# Patient Record
Sex: Female | Born: 1950 | Race: White | Hispanic: No | Smoking: Never smoker
Health system: Southern US, Community
[De-identification: ages and names within clinical notes are randomized; demographics above are authoritative.]

## PROBLEM LIST (undated history)

## (undated) DIAGNOSIS — E876 Hypokalemia: Secondary | ICD-10-CM

## (undated) DIAGNOSIS — E785 Hyperlipidemia, unspecified: Secondary | ICD-10-CM

## (undated) DIAGNOSIS — F329 Major depressive disorder, single episode, unspecified: Secondary | ICD-10-CM

## (undated) DIAGNOSIS — R7303 Prediabetes: Secondary | ICD-10-CM

## (undated) DIAGNOSIS — Z9884 Bariatric surgery status: Secondary | ICD-10-CM

## (undated) DIAGNOSIS — F419 Anxiety disorder, unspecified: Secondary | ICD-10-CM

## (undated) DIAGNOSIS — F32A Depression, unspecified: Secondary | ICD-10-CM

## (undated) DIAGNOSIS — I10 Essential (primary) hypertension: Secondary | ICD-10-CM

## (undated) DIAGNOSIS — E039 Hypothyroidism, unspecified: Secondary | ICD-10-CM

## (undated) DIAGNOSIS — D649 Anemia, unspecified: Secondary | ICD-10-CM

## (undated) HISTORY — PX: CHOLECYSTECTOMY: SHX55

## (undated) HISTORY — DX: Depression, unspecified: F32.A

## (undated) HISTORY — PX: GASTRIC BYPASS: SHX52

## (undated) HISTORY — DX: Anxiety disorder, unspecified: F41.9

## (undated) HISTORY — PX: CERVICAL FUSION: SHX112

## (undated) HISTORY — DX: Prediabetes: R73.03

## (undated) HISTORY — DX: Major depressive disorder, single episode, unspecified: F32.9

## (undated) HISTORY — DX: Anemia, unspecified: D64.9

## (undated) HISTORY — DX: Hypokalemia: E87.6

## (undated) HISTORY — DX: Essential (primary) hypertension: I10

## (undated) HISTORY — PX: TUBAL LIGATION: SHX77

## (undated) HISTORY — DX: Bariatric surgery status: Z98.84

## (undated) HISTORY — DX: Hyperlipidemia, unspecified: E78.5

## (undated) HISTORY — DX: Hypothyroidism, unspecified: E03.9

---

## 1997-09-28 ENCOUNTER — Other Ambulatory Visit: Admission: RE | Admit: 1997-09-28 | Discharge: 1997-09-28 | Payer: Self-pay | Admitting: *Deleted

## 1998-01-22 ENCOUNTER — Encounter: Payer: Self-pay | Admitting: *Deleted

## 1998-01-22 ENCOUNTER — Ambulatory Visit (HOSPITAL_COMMUNITY): Admission: RE | Admit: 1998-01-22 | Discharge: 1998-01-22 | Payer: Self-pay | Admitting: *Deleted

## 1998-10-10 ENCOUNTER — Ambulatory Visit (HOSPITAL_COMMUNITY): Admission: RE | Admit: 1998-10-10 | Discharge: 1998-10-10 | Payer: Self-pay | Admitting: *Deleted

## 1998-11-11 ENCOUNTER — Ambulatory Visit (HOSPITAL_COMMUNITY): Admission: RE | Admit: 1998-11-11 | Discharge: 1998-11-12 | Payer: Self-pay

## 1998-12-17 ENCOUNTER — Inpatient Hospital Stay (HOSPITAL_COMMUNITY): Admission: EM | Admit: 1998-12-17 | Discharge: 1998-12-19 | Payer: Self-pay | Admitting: Emergency Medicine

## 1999-01-31 ENCOUNTER — Ambulatory Visit (HOSPITAL_COMMUNITY): Admission: RE | Admit: 1999-01-31 | Discharge: 1999-01-31 | Payer: Self-pay | Admitting: *Deleted

## 1999-01-31 ENCOUNTER — Encounter: Payer: Self-pay | Admitting: *Deleted

## 1999-12-10 ENCOUNTER — Emergency Department (HOSPITAL_COMMUNITY): Admission: EM | Admit: 1999-12-10 | Discharge: 1999-12-10 | Payer: Self-pay | Admitting: Emergency Medicine

## 2000-07-13 ENCOUNTER — Encounter: Payer: Self-pay | Admitting: Emergency Medicine

## 2000-07-13 ENCOUNTER — Emergency Department (HOSPITAL_COMMUNITY): Admission: EM | Admit: 2000-07-13 | Discharge: 2000-07-13 | Payer: Self-pay | Admitting: Emergency Medicine

## 2001-01-28 ENCOUNTER — Encounter: Payer: Self-pay | Admitting: *Deleted

## 2001-01-28 ENCOUNTER — Ambulatory Visit (HOSPITAL_COMMUNITY): Admission: RE | Admit: 2001-01-28 | Discharge: 2001-01-28 | Payer: Self-pay | Admitting: *Deleted

## 2002-04-28 ENCOUNTER — Encounter: Payer: Self-pay | Admitting: Emergency Medicine

## 2002-04-28 ENCOUNTER — Emergency Department (HOSPITAL_COMMUNITY): Admission: EM | Admit: 2002-04-28 | Discharge: 2002-04-28 | Payer: Self-pay | Admitting: Emergency Medicine

## 2002-07-27 ENCOUNTER — Encounter: Payer: Self-pay | Admitting: Internal Medicine

## 2002-07-27 ENCOUNTER — Inpatient Hospital Stay (HOSPITAL_COMMUNITY): Admission: EM | Admit: 2002-07-27 | Discharge: 2002-07-29 | Payer: Self-pay | Admitting: Internal Medicine

## 2002-12-01 ENCOUNTER — Encounter: Payer: Self-pay | Admitting: Internal Medicine

## 2002-12-01 ENCOUNTER — Ambulatory Visit (HOSPITAL_COMMUNITY): Admission: RE | Admit: 2002-12-01 | Discharge: 2002-12-01 | Payer: Self-pay | Admitting: Internal Medicine

## 2003-08-01 ENCOUNTER — Other Ambulatory Visit: Admission: RE | Admit: 2003-08-01 | Discharge: 2003-08-01 | Payer: Self-pay | Admitting: Internal Medicine

## 2005-03-12 ENCOUNTER — Ambulatory Visit (HOSPITAL_COMMUNITY): Admission: RE | Admit: 2005-03-12 | Discharge: 2005-03-12 | Payer: Self-pay | Admitting: Internal Medicine

## 2005-06-09 ENCOUNTER — Ambulatory Visit: Payer: Self-pay | Admitting: Internal Medicine

## 2005-07-17 ENCOUNTER — Encounter (HOSPITAL_COMMUNITY): Admission: RE | Admit: 2005-07-17 | Discharge: 2005-10-15 | Payer: Self-pay | Admitting: Gastroenterology

## 2006-03-15 ENCOUNTER — Ambulatory Visit (HOSPITAL_COMMUNITY): Admission: RE | Admit: 2006-03-15 | Discharge: 2006-03-15 | Payer: Self-pay | Admitting: Internal Medicine

## 2007-01-26 ENCOUNTER — Other Ambulatory Visit: Admission: RE | Admit: 2007-01-26 | Discharge: 2007-01-26 | Payer: Self-pay | Admitting: Interventional Radiology

## 2007-01-26 ENCOUNTER — Encounter: Admission: RE | Admit: 2007-01-26 | Discharge: 2007-01-26 | Payer: Self-pay | Admitting: Otolaryngology

## 2007-01-26 ENCOUNTER — Encounter (INDEPENDENT_AMBULATORY_CARE_PROVIDER_SITE_OTHER): Payer: Self-pay | Admitting: Interventional Radiology

## 2007-03-17 ENCOUNTER — Ambulatory Visit: Payer: Self-pay | Admitting: Internal Medicine

## 2007-03-17 DIAGNOSIS — J45909 Unspecified asthma, uncomplicated: Secondary | ICD-10-CM | POA: Insufficient documentation

## 2007-03-29 ENCOUNTER — Encounter: Payer: Self-pay | Admitting: Internal Medicine

## 2007-05-23 ENCOUNTER — Telehealth (INDEPENDENT_AMBULATORY_CARE_PROVIDER_SITE_OTHER): Payer: Self-pay | Admitting: *Deleted

## 2007-08-30 ENCOUNTER — Ambulatory Visit (HOSPITAL_COMMUNITY): Admission: RE | Admit: 2007-08-30 | Discharge: 2007-08-30 | Payer: Self-pay | Admitting: Internal Medicine

## 2008-09-17 ENCOUNTER — Ambulatory Visit (HOSPITAL_COMMUNITY): Admission: RE | Admit: 2008-09-17 | Discharge: 2008-09-17 | Payer: Self-pay | Admitting: Internal Medicine

## 2009-06-13 ENCOUNTER — Encounter: Admission: RE | Admit: 2009-06-13 | Discharge: 2009-06-13 | Payer: Self-pay | Admitting: Gastroenterology

## 2009-09-23 ENCOUNTER — Ambulatory Visit (HOSPITAL_COMMUNITY): Admission: RE | Admit: 2009-09-23 | Discharge: 2009-09-23 | Payer: Self-pay | Admitting: Internal Medicine

## 2010-06-05 ENCOUNTER — Other Ambulatory Visit (HOSPITAL_COMMUNITY): Payer: Self-pay | Admitting: Internal Medicine

## 2010-06-05 ENCOUNTER — Ambulatory Visit (HOSPITAL_COMMUNITY)
Admission: RE | Admit: 2010-06-05 | Discharge: 2010-06-05 | Disposition: A | Discharge status: Home / Self Care | Payer: Medicare Other | Source: Ambulatory Visit | Attending: Internal Medicine | Admitting: Internal Medicine

## 2010-06-05 DIAGNOSIS — R059 Cough, unspecified: Secondary | ICD-10-CM | POA: Insufficient documentation

## 2010-06-05 DIAGNOSIS — I10 Essential (primary) hypertension: Secondary | ICD-10-CM | POA: Insufficient documentation

## 2010-06-05 DIAGNOSIS — R05 Cough: Secondary | ICD-10-CM | POA: Insufficient documentation

## 2010-06-05 DIAGNOSIS — J189 Pneumonia, unspecified organism: Secondary | ICD-10-CM

## 2010-06-05 DIAGNOSIS — R0602 Shortness of breath: Secondary | ICD-10-CM | POA: Insufficient documentation

## 2010-06-05 DIAGNOSIS — J45909 Unspecified asthma, uncomplicated: Secondary | ICD-10-CM | POA: Insufficient documentation

## 2010-06-24 ENCOUNTER — Encounter: Payer: Self-pay | Admitting: Cardiology

## 2010-07-07 ENCOUNTER — Telehealth: Payer: Self-pay | Admitting: Internal Medicine

## 2010-07-07 ENCOUNTER — Ambulatory Visit (INDEPENDENT_AMBULATORY_CARE_PROVIDER_SITE_OTHER): Payer: Medicare Other | Admitting: Cardiology

## 2010-07-07 ENCOUNTER — Encounter: Payer: Self-pay | Admitting: Cardiology

## 2010-07-07 DIAGNOSIS — R0609 Other forms of dyspnea: Secondary | ICD-10-CM

## 2010-07-07 DIAGNOSIS — R0989 Other specified symptoms and signs involving the circulatory and respiratory systems: Secondary | ICD-10-CM

## 2010-07-07 DIAGNOSIS — R42 Dizziness and giddiness: Secondary | ICD-10-CM

## 2010-07-07 DIAGNOSIS — E785 Hyperlipidemia, unspecified: Secondary | ICD-10-CM

## 2010-07-07 DIAGNOSIS — J45909 Unspecified asthma, uncomplicated: Secondary | ICD-10-CM

## 2010-07-07 LAB — BRAIN NATRIURETIC PEPTIDE: Pro B Natriuretic peptide (BNP): 18 pg/mL (ref 0.0–100.0)

## 2010-07-07 NOTE — Telephone Encounter (Signed)
Spoke w/ Jasmine December w/ Dr. Shirlee Latch office and she states pt has stated she has been in the bed all week due to her asthma and would like to be seen sooner than 08/19/10 w/ Dr. Maple Hudson. Looks like last time pt was last seen in 2008. Please advise if we can work pt to be seen sooner. Thanks  Carver Fila, CMA

## 2010-07-07 NOTE — Assessment & Plan Note (Signed)
Patient has had significant exertional dyspnea (NYHA class III symptoms) for the last 6 months.  For the last week, she has had coughing and wheezing reminiscent of an asthma exacerbation.  She is not wheezing on exam.  It is possible that her symptoms are all due to obesity/deconditioning and asthma.  However, she has a number of risk factors for coronary disease: HTN, hyperlipidemia, family history (mother).   Lauren Mckenzie to assess for ischemia - Echo to assess LV/RV function and to look for evidence of pulmonary hypertension. - Check BNP today.  - Start ASA 81 mg daily - Followup with Dr. Maple Hudson for asthma evaluation.

## 2010-07-07 NOTE — Assessment & Plan Note (Signed)
Excellent lipids at last check.  Continue pravastatin.

## 2010-07-07 NOTE — Progress Notes (Signed)
PCP: Dr. Elisabeth Most  60 yo with history of HTN, hyperlipidemia, and asthma presents for cardiology evaluation.  Patient has had periodic asthma flares with wheezing since childhood.  She has been evaluated by pulmonology, seeing Dr. Maple Hudson back in 2008.  For the last 6 months, she has noted significant exertional dyspnea.  She is short of breath after walking < 50 feet.  She is very short of breath walking up a flight of steps.  No chest pain or tightness.  Over the last week, she has been coughing and wheezing and thinks that she has been having an asthma flare.  She is a nonsmoker.  Additionally, she has episodes of dizziness periodically.  These are a spinning sensation that occurs with positional changes (lying down or sitting up).  This does not happen particularly often but has been worse x 6 months also.   ECG: NSR, normal  Labs (2/12): HCT 34.1, K 4.4, creatinine 1.33, HDL 46, LDL 57, TSH elevated (Levoxyl started)  PMH: 1. HTN 2. Hyperlipidemia 3. Depression 4. Hypothyroidism 5. Asthma since childhood.  Has seen Dr. Maple Hudson in the past for this. 6. Obesity: s/p gastric bypass 7. C-spine fusion 8. H/o cholecystectomy  SH: Patient never smoked.  She is on disability.  She lives with a friend.  No children.  No ETOH.   FH: Mother with MI at 36.  No siblings.  ROS: All systems reviewed and negative except as per HPI.   Current Outpatient Prescriptions  Medication Sig Dispense Refill  . albuterol (PROVENTIL,VENTOLIN) 90 MCG/ACT inhaler Inhale 2 puffs into the lungs every 6 (six) hours as needed.        . Albuterol Sulfate (PROAIR HFA IN) Prn       . ALPRAZolam (XANAX) 1 MG tablet Take 1 mg by mouth at bedtime as needed.        Marland Kitchen aspirin 81 MG EC tablet Take 81 mg by mouth daily.        . Cholecalciferol (VITAMIN D) 2000 UNITS CAPS 2 po daily       . diazepam (VALIUM) 10 MG tablet Take 10 mg by mouth every 6 (six) hours as needed.        . enalapril (VASOTEC) 20 MG tablet       .  Fluticasone-Salmeterol (ADVAIR DISKUS) 100-50 MCG/DOSE AEPB Inhale 1 puff into the lungs every 12 (twelve) hours.        Marland Kitchen levothyroxine (SYNTHROID, LEVOTHROID) 25 MCG tablet       . mirtazapine (REMERON) 15 MG tablet       . pravastatin (PRAVACHOL) 40 MG tablet       . QUEtiapine (SEROQUEL) 100 MG tablet Take 100 mg by mouth at bedtime.        Marland Kitchen SINGULAIR 10 MG tablet       . venlafaxine (EFFEXOR) 75 MG tablet Take 75 mg by mouth at bedtime.        . Venlafaxine HCl 225 MG TB24 Take 1 tablet by mouth daily.        Marland Kitchen EPINEPHrine (EPIPEN JR) 0.15 MG/0.3ML injection Inject 0.15 mg into the muscle as needed.        Marland Kitchen DISCONTD: buPROPion (WELLBUTRIN XL) 300 MG 24 hr tablet Take 300 mg by mouth 3 (three) times daily.        Marland Kitchen DISCONTD: buPROPion (WELLBUTRIN) 100 MG tablet Take 100 mg by mouth 2 (two) times daily.        Marland Kitchen DISCONTD: DULoxetine (CYMBALTA) 60 MG  capsule Take 60 mg by mouth daily.        Marland Kitchen DISCONTD: Ferrous Sulfate (IRON CR PO) 1 po daily       . DISCONTD: Levalbuterol HCl (XOPENEX CONCENTRATE IN) Inhale into the lungs. As directed       . DISCONTD: lidocaine (XYLOCAINE) 5 % ointment       . DISCONTD: LORazepam (ATIVAN) 1 MG tablet Take 1 mg by mouth every 8 (eight) hours.        Marland Kitchen DISCONTD: metroNIDAZOLE (FLAGYL) 500 MG tablet Take 500 mg by mouth 3 (three) times daily.        Marland Kitchen DISCONTD: modafinil (PROVIGIL) 100 MG tablet Take 100 mg by mouth as needed.        Marland Kitchen DISCONTD: promethazine (PHENERGAN) 25 MG tablet Take 25 mg by mouth every 6 (six) hours as needed.        Marland Kitchen DISCONTD: SUMAtriptan (IMITREX) 100 MG tablet Take 100 mg by mouth every 2 (two) hours as needed.        Marland Kitchen DISCONTD: temazepam (RESTORIL) 15 MG capsule Take 15 mg by mouth at bedtime as needed.        Marland Kitchen DISCONTD: thioridazine (MELLARIL) 50 MG tablet Take 50 mg by mouth at bedtime.        Marland Kitchen DISCONTD: triamterene-hydrochlorothiazide (MAXZIDE) 75-50 MG per tablet Take 1 tablet by mouth as needed.        Marland Kitchen DISCONTD:  venlafaxine (EFFEXOR) 100 MG tablet 100 mg. 3 po bid         BP 142/78  Pulse 85  Ht 5\' 5"  (1.651 m)  Wt 249 lb (112.946 kg)  BMI 41.44 kg/m2 General: NAD, obese.  Neck: No JVD, no thyromegaly or thyroid nodule.  Lungs: Clear to auscultation bilaterally with normal respiratory effort. CV: Nondisplaced PMI.  Heart regular S1/S2, no S3/S4, no murmur.  No peripheral edema.  No carotid bruit.  Normal pedal pulses.  Abdomen: Soft, nontender, no hepatosplenomegaly, no distention.  Skin: Intact without lesions or rashes.  Neurologic: Alert and oriented x 3.  Psych: Normal affect. Extremities: No clubbing or cyanosis.  HEENT: Normal.

## 2010-07-07 NOTE — Patient Instructions (Signed)
Take Aspirin 81mg  daily. This should be enteric coated.  Lab today--BNP 786.09  Schedule an appointment for a lexiscan myoview. See instruction sheet.  Schedule an appointment for an echocardiogram.  Dr Shirlee Latch has referred you to Dr Maple Hudson in pulmonary for management and treatment of asthma.  Schedule an appointment to see Dr Shirlee Latch in 3 weeks.

## 2010-07-07 NOTE — Assessment & Plan Note (Signed)
Symptoms sound like benign paroxysmal positional vertigo.  Would have her followup with Dr. Elisabeth Most, could consider ENT or neurology referral if this becomes significantly bothersome.

## 2010-07-08 ENCOUNTER — Telehealth: Payer: Self-pay | Admitting: Cardiology

## 2010-07-08 NOTE — Telephone Encounter (Signed)
Katie- please see what is available

## 2010-07-08 NOTE — Telephone Encounter (Signed)
I have scheduled pt to see CDY on 07-10-10 at 2pm; attempted to call Dr. Alford Highland office-closed; will call back in the morning. Lauren Mckenzie

## 2010-07-09 ENCOUNTER — Encounter: Payer: Self-pay | Admitting: Internal Medicine

## 2010-07-09 ENCOUNTER — Ambulatory Visit (HOSPITAL_COMMUNITY): Payer: Medicare Other | Attending: Cardiology | Admitting: Radiology

## 2010-07-09 DIAGNOSIS — R0609 Other forms of dyspnea: Secondary | ICD-10-CM

## 2010-07-09 DIAGNOSIS — R0600 Dyspnea, unspecified: Secondary | ICD-10-CM

## 2010-07-09 DIAGNOSIS — R0602 Shortness of breath: Secondary | ICD-10-CM

## 2010-07-09 MED ORDER — TECHNETIUM TC 99M TETROFOSMIN IV KIT
33.0000 | PACK | Freq: Once | INTRAVENOUS | Status: AC | PRN
  Administered 2010-07-09: 33 via INTRAVENOUS

## 2010-07-09 MED ORDER — REGADENOSON 0.4 MG/5ML IV SOLN
0.4000 mg | Freq: Once | INTRAVENOUS | Status: AC
  Administered 2010-07-09: 0.4 mg via INTRAVENOUS

## 2010-07-09 NOTE — Telephone Encounter (Signed)
I reviewed with Dr Shirlee Latch. OK with Dr Shirlee Latch for Dr Eden Emms in follow-up.

## 2010-07-09 NOTE — Telephone Encounter (Signed)
Spoke with Jasmine December at Dr. Doy Mince will make patient aware of appt set up with CDY for 07-10-10-if this appt will not work then she or the patient will call me here to [facility].Vivianne Spence

## 2010-07-09 NOTE — Progress Notes (Signed)
[facility] SITE 3 NUCLEAR MED [address] [place] [address] [phone]  Cardiology Nuclear Med Study  Lauren Mckenzie is a 60 y.o. female [id number] [birth year]   Nuclear Med Background Indication for Stress Test:  Evaluation for Ischemia History:  No previous documented CAD Cardiac Risk Factors: Family History - CAD, Hypertension, Lipids and Obesity  Symptoms:  DOE, Fatigue and Palpitations   Nuclear Pre-Procedure Caffeine/Decaff Intake:  None NPO After: 7:00pm   Lungs:  Clear.  O2 sat 96% on RA. IV 0.9% NS with Angio Cath:  20g  IV Site: R Forearm  IV Started by:  Stanton Kidney, EMT-P  Chest Size (in):  46 Cup Size: C  Height: 5' 5.5" (1.664 m)  Weight:  250 lb (113.399 kg)  BMI:  Body mass index is 40.97 kg/(m^2). Tech Comments:  NA    Nuclear Med Study 1 or 2 day study: 2 day  Stress Test Type:  Lexiscan  Reading MD: Willa Rough, MD  Order Authorizing Provider:  Marca Ancona, MD  Resting Radionuclide: Technetium 33m Tetrofosmin  Resting Radionuclide Dose: 33 mCi   Stress Radionuclide:  Technetium 60m Tetrofosmin  Stress Radionuclide Dose: 33 mCi           Stress Protocol Rest HR: 85 Stress HR: 94  Rest BP: 126/76 Stress BP: 131/72  Exercise Time (min): n/a METS: n/a   Predicted Max HR: 160 bpm % Max HR: 58.75 bpm Rate Pressure Product: 21308   Dose of Adenosine (mg):  n/a Dose of Lexiscan: 0.4 mg  Dose of Atropine (mg): n/a Dose of Dobutamine: n/a mcg/kg/min (at max HR)  Stress Test Technologist: [employer], CMA-N  Nuclear Technologist:  Domenic Polite, CNMT     Rest Procedure:  Myocardial perfusion imaging was performed at rest 45 minutes following the intravenous administration of Technetium 53m Tetrofosmin. Rest ECG: No acute changes.  Stress Procedure:  The patient received IV Lexiscan 0.4 mg over 15-seconds.  Technetium 7m Tetrofosmin injected at 30-seconds.  There were no significant changes with Lexiscan, other than  occasional PVC's.  Quantitative spect images were obtained after a 45 minute delay.  Stress ECG: No significant change from baseline ECG  QPS Raw Data Images:  Patient motion noted; appropriate software correction applied. Stress Images:  Mild anterior breast attebuation. Rest Images:  Same as stress Subtraction (SDS):  No evidence of ischemia. Transient Ischemic Dilatation (Normal <1.22):  0.85 Lung/Heart Ratio (Normal <0.45):  0.35  Quantitative Gated Spect Images QGS EDV:  46 ml QGS ESV:  11 ml QGS cine images:  Normal Wall Motion QGS EF: 75%  Impression Exercise Capacity:  Lexiscan with no exercise. BP Response:  Normal blood pressure response. Clinical Symptoms:  Warm ECG Impression:  No significant ST segment change suggestive of ischemia. Comparison with Prior Nuclear Study: No previous nuclear study performed  Overall Impression:  Normal stress nuclear study.  No scar or ischemia.    Willa Rough

## 2010-07-10 ENCOUNTER — Institutional Professional Consult (permissible substitution): Payer: Medicare Other | Admitting: Internal Medicine

## 2010-07-14 ENCOUNTER — Ambulatory Visit (HOSPITAL_COMMUNITY): Payer: Medicare Other | Attending: Cardiology | Admitting: Radiology

## 2010-07-14 ENCOUNTER — Ambulatory Visit (HOSPITAL_COMMUNITY): Payer: Medicare Other | Attending: Cardiology

## 2010-07-14 DIAGNOSIS — R0602 Shortness of breath: Secondary | ICD-10-CM

## 2010-07-14 DIAGNOSIS — R0609 Other forms of dyspnea: Secondary | ICD-10-CM | POA: Insufficient documentation

## 2010-07-14 DIAGNOSIS — R0989 Other specified symptoms and signs involving the circulatory and respiratory systems: Secondary | ICD-10-CM

## 2010-07-14 DIAGNOSIS — R079 Chest pain, unspecified: Secondary | ICD-10-CM

## 2010-07-14 MED ORDER — TECHNETIUM TC 99M TETROFOSMIN IV KIT
33.0000 | PACK | Freq: Once | INTRAVENOUS | Status: AC | PRN
  Administered 2010-07-14: 33 via INTRAVENOUS

## 2010-07-22 NOTE — Progress Notes (Signed)
Pt notified of results by telephone. 

## 2010-08-01 ENCOUNTER — Ambulatory Visit: Payer: Medicare Other | Admitting: Cardiology

## 2010-08-04 ENCOUNTER — Encounter: Payer: Self-pay | Admitting: Cardiovascular Disease

## 2010-08-04 ENCOUNTER — Encounter: Payer: Self-pay | Admitting: *Deleted

## 2010-08-05 ENCOUNTER — Encounter: Payer: Self-pay | Admitting: Cardiovascular Disease

## 2010-08-05 ENCOUNTER — Ambulatory Visit: Payer: Medicare Other | Admitting: Cardiovascular Disease

## 2010-08-05 ENCOUNTER — Ambulatory Visit (INDEPENDENT_AMBULATORY_CARE_PROVIDER_SITE_OTHER): Payer: Medicare Other | Admitting: Cardiovascular Disease

## 2010-08-05 VITALS — BP 102/88 | HR 84 | Resp 16 | Ht 65.0 in | Wt 248.0 lb

## 2010-08-05 DIAGNOSIS — R5383 Other fatigue: Secondary | ICD-10-CM

## 2010-08-05 DIAGNOSIS — R531 Weakness: Secondary | ICD-10-CM

## 2010-08-05 NOTE — Progress Notes (Signed)
HPI:  This is a 60 year old woman presenting for followup evaluation. She was recently seen by Dr. Jearld Pies. She presented for evaluation of shortness of breath and fatigue. She underwent evaluation with a nuclear stress study that showed no significant ischemia. Her gated left ventricular ejection fraction was 75%. She also had an echocardiogram which demonstrated normal left ventricular systolic function and no significant abnormalities.  The patient continues to complain of marked weakness and fatigue. She has chronic shortness of breath but this has not really changed. She underwent remote gastric bypass surgery in 2004 and has gained back much of her weight. She denies chest pain or pressure. She denies leg edema, orthopnea, or PND.  She notes that she has been recently diagnosed with hypothyroidism. She has been started on Synthroid and is seeing her primary care physician back for further evaluation today  Outpatient Encounter Prescriptions as of 08/05/2010  Medication Sig Dispense Refill  . albuterol (PROVENTIL,VENTOLIN) 90 MCG/ACT inhaler Inhale 2 puffs into the lungs every 6 (six) hours as needed.        . Albuterol Sulfate (PROAIR HFA IN) Prn       . ALPRAZolam (XANAX) 1 MG tablet Take 1 mg by mouth at bedtime as needed.        Marland Kitchen aspirin 81 MG EC tablet Take 81 mg by mouth daily.        . Cholecalciferol (VITAMIN D) 2000 UNITS CAPS 2 po daily       . diazepam (VALIUM) 10 MG tablet Take 10 mg by mouth every 6 (six) hours as needed.        . enalapril (VASOTEC) 20 MG tablet 1 tab po qd      . EPINEPHrine (EPIPEN JR) 0.15 MG/0.3ML injection Inject 0.15 mg into the muscle as needed.        . Fluticasone-Salmeterol (ADVAIR DISKUS) 100-50 MCG/DOSE AEPB Inhale 1 puff into the lungs every 12 (twelve) hours.        Marland Kitchen levothyroxine (SYNTHROID, LEVOTHROID) 25 MCG tablet 1 tab po qd      . mirtazapine (REMERON) 15 MG tablet 1 tab po qd      . pravastatin (PRAVACHOL) 40 MG tablet 1 tab po qd      .  QUEtiapine (SEROQUEL) 100 MG tablet Take 100 mg by mouth at bedtime.        Marland Kitchen SINGULAIR 10 MG tablet 1 tab po qd      . Venlafaxine HCl 225 MG TB24 Take 1 tablet by mouth daily.        Marland Kitchen DISCONTD: venlafaxine (EFFEXOR) 75 MG tablet Take 75 mg by mouth at bedtime.         Theophylline  Past Medical History  Diagnosis Date  . S/P gastric bypass   . Hypokalemia   . Anxiety and depression   . Abdominal pain      status post gastric bypass for bariatric surgical   management.    Past Surgical History  Procedure Date  . Gastric bypass   . Cholecystectomy   . Tubal ligation   . Cervical fusion     C6-7    History   Social History  . Marital Status: Divorced    Spouse Name: N/A    Number of Children: N/A  . Years of Education: N/A   Occupational History  . unemployed    Social History Main Topics  . Smoking status: Never Smoker   . Smokeless tobacco: Never Used  . Alcohol Use: No  .  Drug Use: No  . Sexually Active: Not on file   Other Topics Concern  . Not on file   Social History Narrative   Has chihuahua   Never smoked, no alcohol    Family History  Problem Relation Age of Onset  . Heart attack Mother   . Heart attack Father     ROS: General: positive for sweats Eyes: no blurry vision, diplopia, or amaurosis ENT: no sore throat or hearing loss Resp: no cough, wheezing, or hemoptysis CV: no edema or palpitations GI: no abdominal pain, nausea, vomiting, diarrhea, or constipation GU: no dysuria, frequency, or hematuria Skin: no rash Neuro: no headache, numbness, tingling, or weakness of extremities Musculoskeletal: no joint pain or swelling Heme: no bleeding, DVT, or easy bruising Endo: no polydipsia or polyuria  BP 102/88  Pulse 84  Resp 16  Ht 5\' 5"  (1.651 m)  Wt 248 lb (112.492 kg)  BMI 41.27 kg/m2  PHYSICAL EXAM: Pt is alert and oriented, obese woman in no distress. HEENT: normal Neck: JVP normal. Carotid upstrokes normal without bruits. No  thyromegaly. Lungs: equal expansion, clear bilaterally CV: Apex is discrete and nondisplaced, RRR without murmur or gallop Abd: soft, NT, +BS, obese Back: no CVA tenderness Ext: no C/C/E        Femoral pulses 2+= without bruits        DP/PT pulses intact and = Skin: warm and dry without rash Neuro: CNII-XII intact             Strength intact = bilaterally  ASSESSMENT AND PLAN:

## 2010-08-05 NOTE — Patient Instructions (Signed)
Hold Pravastatin for 4 weeks.   Your physician recommends that you schedule a follow-up appointment as needed with Dr. Excell Seltzer.

## 2010-08-05 NOTE — Assessment & Plan Note (Signed)
I reviewed all the patient's cardiac studies, including her nuclear stress test and 2-D echocardiogram. I do not think there is a cardiac etiology to her generalized weakness. It's possible that hypothyroidism as contributing, but I don't know the details or severity of this. I suspect this was fairly mild and she is only taking 25 mcg of Synthroid daily.  I think it's possible that her statin drug as contributing as the timeframe of her progressive weakness correlates with taking pravastatin. She was advised to hold this medication for a period of one month and see if her symptoms improve. She will followup with Dr. Elisabeth Most to make a decision regarding whether to resume her statin after a period of one month to assess her clinical response to holding this medication. I advised the patient that I would be happy to see her back in followup at any time as needed. At this time I don't see a need for further cardiac testing.

## 2010-08-06 ENCOUNTER — Telehealth: Payer: Self-pay | Admitting: Cardiovascular Disease

## 2010-08-06 NOTE — Telephone Encounter (Signed)
Faxed Echo. & Stress to Trip @ [facility] ([phone]).

## 2010-08-15 NOTE — Discharge Summary (Signed)
NAME:  Lauren Mckenzie, Lauren Mckenzie                         ACCOUNT NO.:  [id number]   MEDICAL RECORD NO.:  [id number]                   PATIENT TYPE:  INP   LOCATION:  5023                                 FACILITY:  [facility]   PHYSICIAN:  Clinton D. Maple Hudson, M.D.              DATE OF BIRTH:  05-26-1950   DATE OF ADMISSION:  07/27/2002  DATE OF DISCHARGE:  07/29/2002                                 DISCHARGE SUMMARY   DISCHARGE DIAGNOSES:  1. Status asthmaticus.  2. Hypokalemia.  3. Anxiety/depression.  4. Abdominal pain, status post gastric bypass for bariatric surgical     management.   BRIEF HISTORY:  This is a 60 year old white female nonsmoker known to me  with a very long history of asthma complicated by anxiety and depression.  She presented initially to [facility] with an  exacerbation of wheezing and dyspnea beginning the day before and triggered  by trying to clean a bathroom with Clorox.  She had used her home nebulizer  aggressively and then was treated with an adequate response at [facility]  as I was contacted for admission.  Peak flow at [facility] was reportedly  about 100 and she had been treated with intravenous Solu-Medrol.   PAST MEDICAL HISTORY:  Recent medical history was significant for gastric  bypass surgery at [facility] in January with subsequent 55 pound  weight loss.  She had had multiple hospitalizations since January including  cholecystectomy and evaluations of diarrhea, nausea, vomiting, and shifting  abdominal pains.  Recent anxiety was triggered by loss of her job and she  has been voluntarily admitted to the psychiatry service at [facility] for  adjustment of medication.  Additional past history included hospitalization  3-4 times for asthma since onset at age 78 without intubation or ventilator  support for respiratory failure, previous history of migraine, viral  meningitis, and depression.  She is followed by Dr. Tasia Catchings  for  primary medical care and hypertension.   There has been a previous chemical stress test without evidence of cardiac  disease.  Surgeries have included tubal ligation, gastric bypass surgery in  January 2004, and cholecystectomy.  She had recently weaned off of Effexor.   PHYSICAL EXAMINATION:  VITAL SIGNS:  Pulse 110 and regular, afebrile, blood  pressure 143/76.  GENERAL:  When I first arrived, she was hyperventilating, anxious, tearful,  and shivering.  She calmed gradually but remained wheezy.  HEART:  Sounds were normal.  ABDOMEN:  Healed vertical midline surgical incision scar and her abdomen  was, otherwise, unremarkable.   RADIOLOGICAL DATA:  Chest x-ray showed some elevation of the right  hemidiaphragm without infiltrate or effusion.   ADMISSION IMPRESSION:  Status asthmaticus with agitation caused by fatigue,  heavy use of bronchodilator therapies, and recent job loss.   HOSPITAL COURSE:  She was treated with intravenous magnesium and given  nebulizer treatments with Xopenex.  She was not given DVT prophylaxis  because of her uncertain postoperative status and lack of immobility.  A D-  dimer was checked.  By the next day, she was feeling successfully better.  Medications were reduced and adjusted to home doses.  She requested Xopenex  for home use as better tolerated than her albuterol.  Oxygen saturation on  room air was 98%.  Her condition was markedly improved and she was  considered to be stable and appropriate for discharge home to office  followup wheeze-free and with normal heart sounds, eating well at discharge.   LABORATORY DATA:  Chest x-ray showed hyperinflation, normal heart, prior  cervical plating.   Initial ABGs on 7 L prongs showed pH 7.39, pCO2 33, pO2 127, bicarbonate  19.8.  Initial chemistries revealed sodium 142, potassium low at 3.0,  glucose 167, BUN 12, creatinine 1.4, albumin 3.9.  D-dimer was slightly  elevated at 0.76, considered  nonspecific.  White blood count was 9000 with  normal platelet count, hemoglobin 11,300, 88% neutrophils.  Liver enzymes  were normal.  A repeat chemistry panel showed potassium improved to 5.1.  Glucose improved to 126.   DISCHARGE PLANS:  Diet:  Unrestricted, but consistent with her surgical  instruction.  Activity: Unrestricted.   DISCHARGE MEDICATIONS:  1. Advair 250/50 one inhalation b.i.d.  2. Proventil HSA inhaler two puffs q.i.d. p.r.n.  3. Nebulizer with Xopenex 0.63 q.8 h. p.r.n.  4. Prednisone taper from 10 mg t.i.d., off in six days.  5. Mellaril 10 mg daily.  6. Klonopin 0.5 mg b.i.d.  7. EpiPen to have available for rare use if necessary.  8. Norvasc 5 mg daily.  9. Potassium 20 mEq daily.   FOLLOWUP:  She is to call the office for followup in two weeks after  hospital discharge.  She will follow up with her bariatric surgery at  [facility] as scheduled.  She indicates that she plans to follow up for her  mood disorder with Dr. Milagros Evener and she will follow up with Dr.  Sherin Quarry for internal medicine and GI management.                                               Clinton D. Maple Hudson, M.D.    CDY/MEDQ  D:  08/23/2002  T:  08/24/2002  Job:  [id number]   cc:   Tasia Catchings, M.D.  [address]  [place]  [place] [address]  Fax: [phone]   Milagros Evener, M.D.  [address] [id number]  [place], [place] [address]  Fax: [phone]
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
## 2010-08-19 ENCOUNTER — Institutional Professional Consult (permissible substitution): Payer: Medicare Other | Admitting: Internal Medicine

## 2010-09-26 ENCOUNTER — Other Ambulatory Visit: Payer: Self-pay | Admitting: Internal Medicine

## 2010-09-29 ENCOUNTER — Other Ambulatory Visit: Payer: Medicare Other

## 2010-09-30 ENCOUNTER — Ambulatory Visit
Admission: RE | Admit: 2010-09-30 | Discharge: 2010-09-30 | Disposition: A | Discharge status: Home / Self Care | Payer: Medicare Other | Source: Ambulatory Visit | Attending: Internal Medicine | Admitting: Internal Medicine

## 2010-09-30 MED ORDER — IOHEXOL 300 MG/ML  SOLN: 125.0000 mL | Freq: Once | INTRAMUSCULAR | Status: AC | PRN

## 2010-10-02 ENCOUNTER — Other Ambulatory Visit: Payer: Self-pay | Admitting: Internal Medicine

## 2010-10-02 DIAGNOSIS — R935 Abnormal findings on diagnostic imaging of other abdominal regions, including retroperitoneum: Secondary | ICD-10-CM

## 2010-10-08 ENCOUNTER — Ambulatory Visit
Admission: RE | Admit: 2010-10-08 | Discharge: 2010-10-08 | Disposition: A | Discharge status: Home / Self Care | Payer: Medicare Other | Source: Ambulatory Visit | Attending: Internal Medicine | Admitting: Internal Medicine

## 2010-10-08 DIAGNOSIS — R935 Abnormal findings on diagnostic imaging of other abdominal regions, including retroperitoneum: Secondary | ICD-10-CM

## 2010-12-15 ENCOUNTER — Telehealth: Payer: Self-pay | Admitting: Internal Medicine

## 2010-12-15 NOTE — Telephone Encounter (Signed)
Spoke with patient-aware to be here on Monday 12-22-10 at 245pm for 3pm consult with CY.

## 2010-12-15 NOTE — Telephone Encounter (Signed)
Spoke with pt. She is requesting appt with CDY for increasing DOE. She was last seen in 08 so will need consult slot and first available is in late Oct. She wants to be worked in sooner and states will only see CDY. Please advise, thanks!

## 2010-12-19 ENCOUNTER — Other Ambulatory Visit (HOSPITAL_COMMUNITY): Payer: Self-pay | Admitting: Internal Medicine

## 2010-12-19 DIAGNOSIS — Z1231 Encounter for screening mammogram for malignant neoplasm of breast: Secondary | ICD-10-CM

## 2010-12-22 ENCOUNTER — Encounter: Payer: Self-pay | Admitting: Internal Medicine

## 2010-12-22 ENCOUNTER — Ambulatory Visit (INDEPENDENT_AMBULATORY_CARE_PROVIDER_SITE_OTHER): Payer: Medicare Other | Admitting: Internal Medicine

## 2010-12-22 VITALS — BP 130/90 | HR 109 | Ht 66.0 in | Wt 244.4 lb

## 2010-12-22 DIAGNOSIS — R0989 Other specified symptoms and signs involving the circulatory and respiratory systems: Secondary | ICD-10-CM

## 2010-12-22 DIAGNOSIS — R0609 Other forms of dyspnea: Secondary | ICD-10-CM

## 2010-12-22 DIAGNOSIS — J45909 Unspecified asthma, uncomplicated: Secondary | ICD-10-CM

## 2010-12-22 DIAGNOSIS — R0600 Dyspnea, unspecified: Secondary | ICD-10-CM

## 2010-12-22 NOTE — Patient Instructions (Addendum)
Order- schedule and PFT    Please get CBG at beginning and end of the    Dx dyspnea, asthma             Schedule ONOX on room air

## 2010-12-22 NOTE — Progress Notes (Signed)
Subjective:    Patient ID: Lauren Mckenzie, female    DOB: 28-Jun-1950, 60 y.o.   MRN: [id number]  HPI 12/22/10- 49 yoF never smoker with a history of asthmatic bronchitis, anxiety/depression, allergic rhinitis. Last here in 2007. Comes now to reestablish within the chart. PCP Dr Oneta Rack. She complains that for the past 2 years she is frequently weak tired and having sweating episodes, shortness of breath modest exertion. She gives history of asthma since age 66. Has regained probably all of the weight she lost after gastric bypass surgery in 2004. Her dyspnea weakness and discomfort in her reluctant to leave the house to go to the store. Recent cardiology evaluation by Dr. Excell Seltzer told her that her heart was "okay". She expresses frustration that "they" meaning numerous physicians, having done many tests including CT scans MRI, blood work, has not been able to find "the answer". She would like a prescription for portable oxygen concentrator so that she could carry oxygen around with her, but has not documented hypoxia Her psychiatrist, Dr. Evelene Croon, has suggested she has dumping syndrome, and she says that Dr. Bernarda Caffey has done upper and lower endoscopies but has not offered that diagnosis. She is pending dental implant surgery. Lives alone, on disability. She is postmenopausal. Denies history of DVT/PE or anemia.  Review of Systems Constitutional:   No-   weight loss, night sweats, fevers, chills, fatigue, lassitude.  + Exertional sweats HEENT:   No-  headaches, difficulty swallowing,+ tooth/dental problems, sore throat,       No-  sneezing, itching, ear ache, nasal congestion, post nasal drip,  CV:  No-   chest pain, orthopnea, PND, swelling in lower extremities, anasarca, dizziness, palpitations Resp: + shortness of breath with exertion Not at rest.              No-   productive cough,  No non-productive cough,  No-  coughing up of blood.              No-   change in color of mucus.  No- wheezing.     Skin: No-   rash or lesions. GI:  No-   heartburn, indigestion, abdominal pain, nausea, vomiting, diarrhea,                 change in bowel habits, loss of appetite GU: No-   dysuria, change in color of urine, no urgency or frequency.  No- flank pain. MS:  +   joint pain or swelling.  No- decreased range of motion.  No- back pain. Neuro- grossly normal to observation, Or:  Psych:  No- change in mood or affect. + depression or anxiety.  No memory loss.      Objective:   Physical Exam General- Alert, Oriented, Affect-appropriate, Distress- none acute, calm, obese, neatly dressed Skin- rash-none, lesions- none, excoriation- none Lymphadenopathy- none Head- atraumatic            Eyes- Gross vision intact, PERRLA, conjunctivae clear secretions            Ears- Hearing, canals-normal            Nose- Clear, no-Septal dev, mucus, polyps, erosion, perforation             Throat- Mallampati II , mucosa clear , drainage- none, tonsils- atrophic Neck- flexible , trachea midline, no stridor , thyroid nl, carotid no bruit Chest - symmetrical excursion , unlabored           Heart/CV- RRR , no murmur ,  no gallop  , no rub, nl s1 s2                           - JVD- none , edema- none, stasis changes- none, varices- none           Lung- clear to P&A, wheeze- none, cough- none , dullness-none, rub- none           Chest wall-  Abd- tender-no, distended-no, bowel sounds-present, HSM- no Br/ Gen/ Rectal- Not done, not indicated Extrem- cyanosis- none, clubbing, none, atrophy- none, strength- nl Neuro- grossly intact to observation         Assessment & Plan:

## 2010-12-23 NOTE — Assessment & Plan Note (Signed)
She describes some autonomic instability and exertional dyspnea but not much wheezing. Deconditioning and obesity are probably important. Cardiac problems have been ruled out by Dr. Excell Seltzer. Oxygen saturation here on room air is quite good and she is not a candidate for portable oxygen based on that. Plan schedule PFT and 6 minute walk test. We'll elect to try to get a blood sugar at the beginning and the end of her 6 minute walk exercise.

## 2011-01-01 ENCOUNTER — Ambulatory Visit (HOSPITAL_COMMUNITY)
Admission: RE | Admit: 2011-01-01 | Discharge: 2011-01-01 | Disposition: A | Discharge status: Home / Self Care | Payer: Medicare Other | Source: Ambulatory Visit | Attending: Internal Medicine | Admitting: Internal Medicine

## 2011-01-01 ENCOUNTER — Ambulatory Visit (HOSPITAL_COMMUNITY): Payer: Medicare Other

## 2011-01-01 DIAGNOSIS — Z1231 Encounter for screening mammogram for malignant neoplasm of breast: Secondary | ICD-10-CM | POA: Insufficient documentation

## 2011-01-08 ENCOUNTER — Ambulatory Visit (INDEPENDENT_AMBULATORY_CARE_PROVIDER_SITE_OTHER): Payer: Medicare Other | Admitting: Internal Medicine

## 2011-01-08 DIAGNOSIS — R0600 Dyspnea, unspecified: Secondary | ICD-10-CM

## 2011-01-08 DIAGNOSIS — J45909 Unspecified asthma, uncomplicated: Secondary | ICD-10-CM

## 2011-01-08 DIAGNOSIS — R0989 Other specified symptoms and signs involving the circulatory and respiratory systems: Secondary | ICD-10-CM

## 2011-01-08 LAB — PULMONARY FUNCTION TEST

## 2011-01-08 NOTE — Progress Notes (Signed)
PFT done today. 

## 2011-01-13 ENCOUNTER — Encounter: Payer: Self-pay | Admitting: Internal Medicine

## 2011-01-13 ENCOUNTER — Ambulatory Visit (INDEPENDENT_AMBULATORY_CARE_PROVIDER_SITE_OTHER): Payer: Medicare Other | Admitting: Internal Medicine

## 2011-01-13 VITALS — BP 118/78 | HR 84 | Ht 66.0 in | Wt 246.0 lb

## 2011-01-13 DIAGNOSIS — R0989 Other specified symptoms and signs involving the circulatory and respiratory systems: Secondary | ICD-10-CM

## 2011-01-13 DIAGNOSIS — J45909 Unspecified asthma, uncomplicated: Secondary | ICD-10-CM

## 2011-01-13 DIAGNOSIS — R0600 Dyspnea, unspecified: Secondary | ICD-10-CM

## 2011-01-13 MED ORDER — EPINEPHRINE 0.3 MG/0.3ML IJ DEVI: 0.3000 mg | Freq: Once | INTRAMUSCULAR | Status: AC | PRN

## 2011-01-13 NOTE — Progress Notes (Signed)
Patient ID: Lauren Mckenzie, female    DOB: 01/20/1951, 60 y.o.   MRN: [id number]  HPI 12/22/10- 52 yoF never smoker with a history of asthmatic bronchitis, anxiety/depression, allergic rhinitis. Last here in 2007. Comes now to reestablish within the chart. PCP Dr Oneta Rack. She complains that for the past 2 years she is frequently weak tired and having sweating episodes, shortness of breath modest exertion. She gives history of asthma since age 40. Has regained probably all of the weight she lost after gastric bypass surgery in 2004. Her dyspnea weakness and discomfort in her reluctant to leave the house to go to the store. Recent cardiology evaluation by Dr. Excell Seltzer told her that her heart was "okay". She expresses frustration that "they" meaning numerous physicians, having done many tests including CT scans MRI, blood work, has not been able to find "the answer". She would like a prescription for portable oxygen concentrator so that she could carry oxygen around with her, but has not documented hypoxia Her psychiatrist, Dr. Evelene Croon, has suggested she has dumping syndrome, and she says that Dr. Bernarda Caffey has done upper and lower endoscopies but has not offered that diagnosis. She is pending dental implant surgery. Lives alone, on disability. She is postmenopausal. Denies history of DVT/PE or anemia.  01/13/11- 60 yoF never smoker with a history of asthmatic bronchitis, anxiety/depression, allergic rhinitis. PCP Dr Oneta Rack. She wants to wait on flu vaccine. Has felt stable since last here with no acute issues. She is being evaluated for low hemoglobin. No significant cough, wheeze, chest pain, palpitation or swelling. Her main complaint is exertional dyspnea which she hoped to have portable oxygen. PFT: 01/08/11-mild obstructive airways disease with response to bronchodilator, normal lung volumes, diffusion moderately reduced. FEV1/FVC 0.68, DLCO 63%. 6MWT-97%, 98%, 99%, 184.5 m. She stopped, complaining of  fatigue and shortness of breath. We reviewed these results together.   Review of Systems Constitutional:   No-   weight loss, night sweats, fevers, chills, fatigue, lassitude.  + Exertional sweats HEENT:   No-  headaches, difficulty swallowing,+ tooth/dental problems, sore throat,       No-  sneezing, itching, ear ache, nasal congestion, post nasal drip,  CV:  No-   chest pain, orthopnea, PND, swelling in lower extremities, anasarca, dizziness, palpitations Resp: + shortness of breath with exertion. Not at rest.              No-   productive cough,  No non-productive cough,  No-  coughing up of blood.              No-   change in color of mucus.  No- wheezing.   Skin: No-   rash or lesions. GI:  No-   heartburn, indigestion, abdominal pain, nausea, vomiting, diarrhea,                 change in bowel habits, loss of appetite GU: No-   dysuria, change in color of urine, no urgency or frequency.  No- flank pain. MS:  +   joint pain or swelling.  No- decreased range of motion.  No- back pain. Neuro- grossly normal to observation, Or:  Psych:  No- change in mood or affect. + depression or anxiety.  No memory loss.      Objective:   Physical Exam General- Alert, Oriented, Affect-appropriate, Distress- none acute, calm, obese, neatly dressed Skin- rash-none, lesions- none, excoriation- none Lymphadenopathy- none Head- atraumatic  Eyes- Gross vision intact, PERRLA, conjunctivae clear secretions            Ears- Hearing, canals-normal            Nose- Clear, no-Septal dev, mucus, polyps, erosion, perforation             Throat- Mallampati II , mucosa clear , drainage- none, tonsils- atrophic Neck- flexible , trachea midline, no stridor , thyroid nl, carotid no bruit Chest - symmetrical excursion , unlabored           Heart/CV- RRR , no murmur , no gallop  , no rub, nl s1 s2                           - JVD- none , edema- none, stasis changes- none, varices- none           Lung-  clear to P&A, wheeze- none, cough- none , dullness-none, rub- none           Chest wall-  Abd- tender-no, distended-no, bowel sounds-present, HSM- no Br/ Gen/ Rectal- Not done, not indicated Extrem- cyanosis- none, clubbing, none, atrophy- none, strength- nl Neuro- grossly intact to observation

## 2011-01-13 NOTE — Patient Instructions (Signed)
Order- ONOX on room air   Dx dyspnea  Moving activities- walking, treadmill etc, can help build some stamina if you can keep doing some every day.

## 2011-01-15 NOTE — Assessment & Plan Note (Addendum)
I think obesity with lower lung zone atelectasis contributes to her reduced diffusion capacity. Her dyspnea on exertion mainly reflects deconditioning and significant obesity. I recommended a regular endurance weight loss program based on walking Plan: Overnight oximetry

## 2011-03-16 ENCOUNTER — Ambulatory Visit: Payer: Medicare Other | Admitting: Internal Medicine

## 2011-03-19 ENCOUNTER — Encounter: Payer: Self-pay | Admitting: Internal Medicine

## 2011-06-22 ENCOUNTER — Telehealth: Payer: Self-pay | Admitting: Cardiology

## 2011-06-22 NOTE — Telephone Encounter (Signed)
ROI Mailed to pt  06/22/11/KM

## 2011-06-29 NOTE — Progress Notes (Signed)
Documentation for 6 minute walk test 

## 2011-06-30 NOTE — Telephone Encounter (Signed)
Spoke with Pt, She completed ROI for Bea Laura to obtain her Records, she needs To Sign a DPR, I will Mail her that, and also mail her another ROI for her to obtain  Her Own Medical Records. 06/30/11/KM

## 2011-07-01 ENCOUNTER — Ambulatory Visit (HOSPITAL_COMMUNITY)
Admission: RE | Admit: 2011-07-01 | Discharge: 2011-07-01 | Disposition: A | Discharge status: Home / Self Care | Payer: Medicare Other | Source: Ambulatory Visit | Attending: Physician Assistant | Admitting: Physician Assistant

## 2011-07-01 ENCOUNTER — Other Ambulatory Visit (HOSPITAL_COMMUNITY): Payer: Self-pay | Admitting: Physician Assistant

## 2011-07-01 DIAGNOSIS — R059 Cough, unspecified: Secondary | ICD-10-CM | POA: Insufficient documentation

## 2011-07-01 DIAGNOSIS — J209 Acute bronchitis, unspecified: Secondary | ICD-10-CM | POA: Insufficient documentation

## 2011-07-01 DIAGNOSIS — R509 Fever, unspecified: Secondary | ICD-10-CM | POA: Insufficient documentation

## 2011-07-01 DIAGNOSIS — R05 Cough: Secondary | ICD-10-CM | POA: Insufficient documentation

## 2011-07-01 DIAGNOSIS — R062 Wheezing: Secondary | ICD-10-CM | POA: Insufficient documentation

## 2011-11-05 ENCOUNTER — Other Ambulatory Visit: Payer: Self-pay | Admitting: Internal Medicine

## 2011-11-05 DIAGNOSIS — R52 Pain, unspecified: Secondary | ICD-10-CM

## 2011-11-05 DIAGNOSIS — R609 Edema, unspecified: Secondary | ICD-10-CM

## 2011-11-06 ENCOUNTER — Ambulatory Visit
Admission: RE | Admit: 2011-11-06 | Discharge: 2011-11-06 | Disposition: A | Discharge status: Home / Self Care | Payer: Medicare Other | Source: Ambulatory Visit | Attending: Internal Medicine | Admitting: Internal Medicine

## 2011-11-06 DIAGNOSIS — R609 Edema, unspecified: Secondary | ICD-10-CM

## 2011-11-06 DIAGNOSIS — R52 Pain, unspecified: Secondary | ICD-10-CM

## 2011-12-21 ENCOUNTER — Other Ambulatory Visit (HOSPITAL_COMMUNITY): Payer: Self-pay | Admitting: Internal Medicine

## 2011-12-21 DIAGNOSIS — Z1231 Encounter for screening mammogram for malignant neoplasm of breast: Secondary | ICD-10-CM

## 2011-12-29 ENCOUNTER — Other Ambulatory Visit (HOSPITAL_COMMUNITY): Payer: Self-pay | Admitting: Internal Medicine

## 2011-12-29 DIAGNOSIS — Z139 Encounter for screening, unspecified: Secondary | ICD-10-CM

## 2012-01-18 ENCOUNTER — Ambulatory Visit (HOSPITAL_COMMUNITY): Payer: Medicare Other

## 2012-01-18 ENCOUNTER — Ambulatory Visit (HOSPITAL_COMMUNITY)
Admission: RE | Admit: 2012-01-18 | Discharge: 2012-01-18 | Disposition: A | Discharge status: Home / Self Care | Payer: Medicare Other | Source: Ambulatory Visit | Attending: Internal Medicine | Admitting: Internal Medicine

## 2012-01-18 DIAGNOSIS — Z780 Asymptomatic menopausal state: Secondary | ICD-10-CM | POA: Insufficient documentation

## 2012-01-18 DIAGNOSIS — Z13820 Encounter for screening for osteoporosis: Secondary | ICD-10-CM | POA: Insufficient documentation

## 2012-01-18 DIAGNOSIS — Z1231 Encounter for screening mammogram for malignant neoplasm of breast: Secondary | ICD-10-CM | POA: Insufficient documentation

## 2012-01-18 DIAGNOSIS — Z139 Encounter for screening, unspecified: Secondary | ICD-10-CM

## 2012-01-19 ENCOUNTER — Ambulatory Visit (HOSPITAL_COMMUNITY): Payer: Medicare Other

## 2012-10-10 ENCOUNTER — Ambulatory Visit (HOSPITAL_COMMUNITY)
Admission: RE | Admit: 2012-10-10 | Discharge: 2012-10-10 | Disposition: A | Discharge status: Home / Self Care | Payer: Medicare Other | Source: Ambulatory Visit | Attending: Internal Medicine | Admitting: Internal Medicine

## 2012-10-10 ENCOUNTER — Other Ambulatory Visit (HOSPITAL_COMMUNITY): Payer: Self-pay | Admitting: Internal Medicine

## 2012-10-10 DIAGNOSIS — R52 Pain, unspecified: Secondary | ICD-10-CM

## 2012-10-10 DIAGNOSIS — M25473 Effusion, unspecified ankle: Secondary | ICD-10-CM | POA: Insufficient documentation

## 2012-10-10 DIAGNOSIS — M25476 Effusion, unspecified foot: Secondary | ICD-10-CM | POA: Insufficient documentation

## 2012-10-10 DIAGNOSIS — M25579 Pain in unspecified ankle and joints of unspecified foot: Secondary | ICD-10-CM | POA: Insufficient documentation

## 2012-10-20 ENCOUNTER — Other Ambulatory Visit (HOSPITAL_COMMUNITY): Payer: Self-pay | Admitting: Internal Medicine

## 2012-10-20 ENCOUNTER — Ambulatory Visit (HOSPITAL_COMMUNITY)
Admission: RE | Admit: 2012-10-20 | Discharge: 2012-10-20 | Disposition: A | Discharge status: Home / Self Care | Payer: Medicare Other | Source: Ambulatory Visit | Attending: Internal Medicine | Admitting: Internal Medicine

## 2012-10-20 DIAGNOSIS — M25579 Pain in unspecified ankle and joints of unspecified foot: Secondary | ICD-10-CM | POA: Insufficient documentation

## 2012-10-20 DIAGNOSIS — M79609 Pain in unspecified limb: Secondary | ICD-10-CM | POA: Insufficient documentation

## 2012-10-20 DIAGNOSIS — W19XXXA Unspecified fall, initial encounter: Secondary | ICD-10-CM

## 2013-02-06 ENCOUNTER — Other Ambulatory Visit: Payer: Self-pay | Admitting: Family Medicine

## 2013-02-06 DIAGNOSIS — M79671 Pain in right foot: Secondary | ICD-10-CM

## 2013-02-07 ENCOUNTER — Ambulatory Visit
Admission: RE | Admit: 2013-02-07 | Discharge: 2013-02-07 | Disposition: A | Discharge status: Home / Self Care | Payer: Medicare Other | Source: Ambulatory Visit | Attending: Family Medicine | Admitting: Family Medicine

## 2013-02-07 DIAGNOSIS — M79671 Pain in right foot: Secondary | ICD-10-CM

## 2013-02-07 MED ORDER — GADOBENATE DIMEGLUMINE 529 MG/ML IV SOLN
18.0000 mL | Freq: Once | INTRAVENOUS | Status: AC | PRN
  Administered 2013-02-07: 18 mL via INTRAVENOUS

## 2013-02-08 ENCOUNTER — Other Ambulatory Visit: Payer: Self-pay | Admitting: Internal Medicine

## 2013-02-08 DIAGNOSIS — Z1231 Encounter for screening mammogram for malignant neoplasm of breast: Secondary | ICD-10-CM

## 2013-02-20 ENCOUNTER — Other Ambulatory Visit: Payer: Self-pay | Admitting: Internal Medicine

## 2013-02-20 NOTE — Telephone Encounter (Signed)
LMOM on cell number, asked to have patient return call , no details left on machine, Xanax filled but per Marchelle Folks no Fioricet as she is using to many, will need a follow up if needs, 100 were filled on 01-04-13

## 2013-02-22 ENCOUNTER — Ambulatory Visit (HOSPITAL_COMMUNITY)
Admission: RE | Admit: 2013-02-22 | Discharge: 2013-02-22 | Disposition: A | Discharge status: Home / Self Care | Payer: Medicare Other | Source: Ambulatory Visit | Attending: Internal Medicine | Admitting: Internal Medicine

## 2013-02-22 ENCOUNTER — Other Ambulatory Visit: Payer: Self-pay | Admitting: Internal Medicine

## 2013-02-22 DIAGNOSIS — Z1231 Encounter for screening mammogram for malignant neoplasm of breast: Secondary | ICD-10-CM | POA: Insufficient documentation

## 2013-02-24 ENCOUNTER — Emergency Department (HOSPITAL_COMMUNITY)
Admission: EM | Admit: 2013-02-24 | Discharge: 2013-02-24 | Disposition: A | Discharge status: Home / Self Care | Payer: Medicare Other | Attending: Emergency Medicine | Admitting: Emergency Medicine

## 2013-02-24 ENCOUNTER — Encounter (HOSPITAL_COMMUNITY): Payer: Self-pay | Admitting: Emergency Medicine

## 2013-02-24 DIAGNOSIS — Z79899 Other long term (current) drug therapy: Secondary | ICD-10-CM | POA: Insufficient documentation

## 2013-02-24 DIAGNOSIS — R630 Anorexia: Secondary | ICD-10-CM | POA: Insufficient documentation

## 2013-02-24 DIAGNOSIS — F329 Major depressive disorder, single episode, unspecified: Secondary | ICD-10-CM | POA: Insufficient documentation

## 2013-02-24 DIAGNOSIS — F3289 Other specified depressive episodes: Secondary | ICD-10-CM | POA: Insufficient documentation

## 2013-02-24 DIAGNOSIS — G4700 Insomnia, unspecified: Secondary | ICD-10-CM | POA: Insufficient documentation

## 2013-02-24 DIAGNOSIS — R259 Unspecified abnormal involuntary movements: Secondary | ICD-10-CM | POA: Insufficient documentation

## 2013-02-24 DIAGNOSIS — R110 Nausea: Secondary | ICD-10-CM | POA: Insufficient documentation

## 2013-02-24 DIAGNOSIS — Z7982 Long term (current) use of aspirin: Secondary | ICD-10-CM | POA: Insufficient documentation

## 2013-02-24 DIAGNOSIS — T380X5A Adverse effect of glucocorticoids and synthetic analogues, initial encounter: Secondary | ICD-10-CM | POA: Insufficient documentation

## 2013-02-24 DIAGNOSIS — F411 Generalized anxiety disorder: Secondary | ICD-10-CM | POA: Insufficient documentation

## 2013-02-24 DIAGNOSIS — Z9884 Bariatric surgery status: Secondary | ICD-10-CM | POA: Insufficient documentation

## 2013-02-24 DIAGNOSIS — E669 Obesity, unspecified: Secondary | ICD-10-CM | POA: Insufficient documentation

## 2013-02-24 DIAGNOSIS — R5381 Other malaise: Secondary | ICD-10-CM | POA: Insufficient documentation

## 2013-02-24 LAB — URINALYSIS, ROUTINE W REFLEX MICROSCOPIC
Hgb urine dipstick: NEGATIVE
Nitrite: NEGATIVE
Specific Gravity, Urine: 1.009 (ref 1.005–1.030)
Urobilinogen, UA: 0.2 mg/dL (ref 0.0–1.0)

## 2013-02-24 LAB — BASIC METABOLIC PANEL
Chloride: 95 mEq/L — ABNORMAL LOW (ref 96–112)
GFR calc non Af Amer: 58 mL/min — ABNORMAL LOW (ref >90)
Glucose, Bld: 97 mg/dL (ref 70–99)
Potassium: 3.9 mEq/L (ref 3.5–5.1)
Sodium: 132 mEq/L — ABNORMAL LOW (ref 135–145)

## 2013-02-24 LAB — CBC WITH DIFFERENTIAL/PLATELET
Eosinophils Absolute: 0.2 10*3/uL (ref 0.0–0.7)
Lymphocytes Relative: 24 % (ref 12–46)
Lymphs Abs: 2 10*3/uL (ref 0.7–4.0)
MCH: 27.9 pg (ref 26.0–34.0)
Neutro Abs: 5.3 10*3/uL (ref 1.7–7.7)
Neutrophils Relative %: 66 % (ref 43–77)
Platelets: 244 10*3/uL (ref 150–400)
RBC: 4.45 MIL/uL (ref 3.87–5.11)
WBC: 8.1 10*3/uL (ref 4.0–10.5)

## 2013-02-24 MED ORDER — ONDANSETRON HCL 8 MG PO TABS: 8.0000 mg | ORAL_TABLET | Freq: Three times a day (TID) | ORAL | Status: DC | PRN

## 2013-02-24 MED ORDER — ONDANSETRON 4 MG PO TBDP
8.0000 mg | ORAL_TABLET | Freq: Once | ORAL | Status: AC
  Administered 2013-02-24: 8 mg via ORAL
  Filled 2013-02-24: qty 2

## 2013-02-24 NOTE — ED Provider Notes (Signed)
CSN: [id number]     Arrival date & time 02/24/13  0736 History   First MD Initiated Contact with Patient 02/24/13 0745     Chief Complaint  Patient presents with  . Insomnia  . Tremors  . Nausea  . Medication Reaction   (Consider location/radiation/quality/duration/timing/severity/associated sxs/prior Treatment) HPI Comments: Lauren Mckenzie is a 62 y.o. female who presents for evaluation, by EMS, for nausea, iron taste in mouth, foot pain, tremors, and inability to sleep. Symptoms started after she had an injection in the right foot for a chronic "toe fracture." She is using her usual medications at home without relief. She feels that she is having allergic reaction to the recent cortisone injection. She has had a decreased appetite, with lower intake of food for several years since her gastric bypass. She saw her pulmonologist in March. He wanted to get a sleep study, but this has not been scheduled yet. She denies back pain, abdominal pain, chest pain, shortness of breath, weakness, or dizziness. There are no other known modifying factors.   The history is provided by the patient.    Past Medical History  Diagnosis Date  . S/P gastric bypass   . Hypokalemia   . Anxiety and depression   . Abdominal pain      status post gastric bypass for bariatric surgical   management.   Past Surgical History  Procedure Laterality Date  . Gastric bypass    . Cholecystectomy    . Tubal ligation    . Cervical fusion      C6-7   Family History  Problem Relation Age of Onset  . Heart attack Mother   . Heart attack Father    History  Substance Use Topics  . Smoking status: Never Smoker   . Smokeless tobacco: Never Used  . Alcohol Use: No   OB History   Grav Para Term Preterm Abortions TAB SAB Ect Mult Living                 Review of Systems  All other systems reviewed and are negative.    Allergies  Theophylline  Home Medications   Current Outpatient Rx  Name  Route  Sig   Dispense  Refill  . albuterol (PROVENTIL,VENTOLIN) 90 MCG/ACT inhaler   Inhalation   Inhale 2 puffs into the lungs every 6 (six) hours as needed for wheezing or shortness of breath.          . ALPRAZolam (XANAX) 1 MG tablet   Oral   Take 1 mg by mouth 3 (three) times daily as needed for anxiety.         Marland Kitchen aspirin 81 MG EC tablet   Oral   Take 81 mg by mouth daily.           . B Complex Vitamins (GNP VITAMIN B COMPLEX PO)   Oral   Take 1 tablet by mouth daily.          . calcium carbonate (OS-CAL) 600 MG TABS   Oral   Take 600 mg by mouth daily.           . Cholecalciferol (VITAMIN D) 2000 UNITS CAPS   Oral   Take 2 capsules by mouth daily.          Marland Kitchen EPINEPHrine (EPIPEN JR) 0.15 MG/0.3ML injection   Intramuscular   Inject 0.15 mg into the muscle as needed.           . ferrous sulfate 325 (  65 FE) MG tablet   Oral   Take 325 mg by mouth daily with breakfast.          . Fluticasone-Salmeterol (ADVAIR DISKUS) 100-50 MCG/DOSE AEPB   Inhalation   Inhale 1 puff into the lungs every 12 (twelve) hours.           . furosemide (LASIX) 40 MG tablet   Oral   Take 40 mg by mouth daily as needed. For swelling         . levothyroxine (SYNTHROID, LEVOTHROID) 25 MCG tablet   Oral   Take 25 mcg by mouth daily before breakfast.          . QUEtiapine (SEROQUEL) 100 MG tablet   Oral   Take 100 mg by mouth at bedtime.           Marland Kitchen venlafaxine (EFFEXOR) 100 MG tablet   Oral   Take 100 mg by mouth 3 (three) times daily.         . ondansetron (ZOFRAN) 8 MG tablet   Oral   Take 1 tablet (8 mg total) by mouth every 8 (eight) hours as needed for nausea or vomiting.   20 tablet   0    BP 168/81  Pulse 70  Temp(Src) 98.5 F (36.9 C) (Oral)  Resp 16  SpO2 97% Physical Exam  Nursing note and vitals reviewed. Constitutional: She is oriented to person, place, and time. She appears well-developed.  Obese, appears older than stated age.  HENT:  Head:  Normocephalic and atraumatic.  Eyes: Conjunctivae and EOM are normal. Pupils are equal, round, and reactive to light.  Neck: Normal range of motion and phonation normal. Neck supple.  Cardiovascular: Normal rate, regular rhythm and intact distal pulses.   Pulmonary/Chest: Effort normal and breath sounds normal. She exhibits no tenderness.  Abdominal: Soft. She exhibits no distension. There is no tenderness. There is no guarding.  Musculoskeletal: Normal range of motion.  Neurological: She is alert and oriented to person, place, and time. She exhibits normal muscle tone.  Skin: Skin is warm and dry.  Psychiatric: Her behavior is normal. Judgment and thought content normal.  She appears anxious , and depressed    ED Course  Procedures (including critical care time)  Medications  ondansetron (ZOFRAN-ODT) disintegrating tablet 8 mg (8 mg Oral Given 02/24/13 0846)    Patient Vitals for the past 24 hrs:  BP Temp Temp src Pulse Resp SpO2  02/24/13 1022 168/81 mmHg - - 70 16 97 %  02/24/13 0856 152/79 mmHg - - 80 - -  02/24/13 0856 157/83 mmHg - - 75 - -  02/24/13 0856 135/74 mmHg - - 64 - -  02/24/13 0748 177/82 mmHg 98.5 F (36.9 C) Oral 64 14 96 %    12:03 PM Reevaluation with update and discussion. After initial assessment and treatment, an updated evaluation reveals she is feeling better, now, she is tolerating some oral fluids. Chala Gul L    Labs Review Labs Reviewed  BASIC METABOLIC PANEL - Abnormal; Notable for the following:    Sodium 132 (*)    Chloride 95 (*)    GFR calc non Af Amer 58 (*)    GFR calc Af Amer 67 (*)    All other components within normal limits  URINALYSIS, ROUTINE W REFLEX MICROSCOPIC - Abnormal; Notable for the following:    Leukocytes, UA SMALL (*)    All other components within normal limits  URINE CULTURE  CBC WITH DIFFERENTIAL  URINE MICROSCOPIC-ADD ON   Imaging Review No results found.  EKG Interpretation    Date/Time:  Friday  February 24 2013 08:47:50 EST Ventricular Rate:  64 PR Interval:  190 QRS Duration: 85 QT Interval:  427 QTC Calculation: 441 R Axis:   13 Text Interpretation:  Sinus rhythm Low voltage, extremity leads since last tracing no significant change Confirmed by Coda Mathey  MD, Jaylyn Booher ([id number]) on 02/24/2013 9:26:48 AM            MDM   1. Malaise   2. Nausea    Nonspecific symptoms with nausea. Symptoms are chronic and recurrent. No evidence for acute cardiopulmonary metabolic or infectious processes. She is stable for discharge.  Nursing Notes Reviewed/ Care Coordinated, and agree without changes. Applicable Imaging Reviewed.  Interpretation of Laboratory Data incorporated into ED treatment    Plan: Home Medications- Zofran; Home Treatments and Observation- rest, fluids; return here if the recommended treatment, does not improve the symptoms; Recommended follow up- PCP, for checkup in 4-5 days    Flint Melter, MD 02/24/13 1204

## 2013-02-24 NOTE — ED Notes (Signed)
Pt arrived by [facility] from home. Pt was given a cortisone shot to right first toe d/t hairline fx. Pt stated since the evening after the shot pt has been having insomnia, nausea, tremors/twitching, and Iron taste in her mouth. Pt call the MD office and the PA told pt to take Benadryl and pt did not because she stated "Benadrly does not do anything for me, I use to take it for allergies and I was having muscle cramps too". The pt did take Xanax yesterday at 12 and was able to sleep a couple of hours "but it wasn't sleep". Pt has been given the shot in the past with no reactions. EMS administered Zofran 4mg  IV that helped with pts nausea. 20G R AC.  CBG-114 BP-166/86 HR-61

## 2013-02-24 NOTE — ED Notes (Signed)
Pt comfortable with d/c and f/u instructions. Prescriptions x1 

## 2013-02-25 LAB — URINE CULTURE: Colony Count: 45000

## 2013-02-28 ENCOUNTER — Encounter: Payer: Self-pay | Admitting: Internal Medicine

## 2013-02-28 ENCOUNTER — Other Ambulatory Visit: Payer: Self-pay | Admitting: Physician Assistant

## 2013-02-28 NOTE — Progress Notes (Signed)
Patient ID: Lauren Mckenzie, female   DOB: February 24, 1951, 62 y.o.   MRN: [id number] Per Marchelle Folks called [facility]- [address] [phone] and cancelled the Xanax prescription. Per Carollee Herter at [facility] patient last filled Diazepam 10 mg in October prescribed by Dr Milagros Evener, filled Xanax 1 mg in November. Per  instruction from Quentin Mulling, [place] cancelled Xanax prescription at pharmacy. Patient cannot take both.

## 2013-03-07 ENCOUNTER — Other Ambulatory Visit: Payer: Self-pay | Admitting: Internal Medicine

## 2013-03-07 DIAGNOSIS — R51 Headache: Secondary | ICD-10-CM

## 2013-03-07 MED ORDER — ISOMETHEPTENE-APAP-DICHLORAL 65-325-100 MG PO CAPS: 1.0000 | ORAL_CAPSULE | ORAL | Status: DC | PRN

## 2013-06-08 ENCOUNTER — Other Ambulatory Visit: Payer: Self-pay | Admitting: Internal Medicine

## 2013-07-09 DIAGNOSIS — R7303 Prediabetes: Secondary | ICD-10-CM | POA: Insufficient documentation

## 2013-07-09 DIAGNOSIS — I10 Essential (primary) hypertension: Secondary | ICD-10-CM | POA: Insufficient documentation

## 2013-07-09 DIAGNOSIS — E039 Hypothyroidism, unspecified: Secondary | ICD-10-CM | POA: Insufficient documentation

## 2013-07-09 DIAGNOSIS — F329 Major depressive disorder, single episode, unspecified: Secondary | ICD-10-CM | POA: Insufficient documentation

## 2013-07-09 DIAGNOSIS — D649 Anemia, unspecified: Secondary | ICD-10-CM | POA: Insufficient documentation

## 2013-07-09 DIAGNOSIS — F32A Depression, unspecified: Secondary | ICD-10-CM | POA: Insufficient documentation

## 2013-07-09 DIAGNOSIS — F419 Anxiety disorder, unspecified: Secondary | ICD-10-CM | POA: Insufficient documentation

## 2013-07-12 ENCOUNTER — Ambulatory Visit (INDEPENDENT_AMBULATORY_CARE_PROVIDER_SITE_OTHER): Payer: Medicare Other | Admitting: Physician Assistant

## 2013-07-12 ENCOUNTER — Encounter: Payer: Self-pay | Admitting: Physician Assistant

## 2013-07-12 VITALS — BP 138/84 | HR 88 | Temp 97.6°F | Resp 16 | Ht 64.5 in | Wt 192.0 lb

## 2013-07-12 DIAGNOSIS — R7303 Prediabetes: Secondary | ICD-10-CM

## 2013-07-12 DIAGNOSIS — G4733 Obstructive sleep apnea (adult) (pediatric): Secondary | ICD-10-CM

## 2013-07-12 DIAGNOSIS — I10 Essential (primary) hypertension: Secondary | ICD-10-CM

## 2013-07-12 DIAGNOSIS — D649 Anemia, unspecified: Secondary | ICD-10-CM

## 2013-07-12 DIAGNOSIS — E785 Hyperlipidemia, unspecified: Secondary | ICD-10-CM

## 2013-07-12 DIAGNOSIS — Z79899 Other long term (current) drug therapy: Secondary | ICD-10-CM

## 2013-07-12 DIAGNOSIS — E538 Deficiency of other specified B group vitamins: Secondary | ICD-10-CM

## 2013-07-12 DIAGNOSIS — R059 Cough, unspecified: Secondary | ICD-10-CM

## 2013-07-12 DIAGNOSIS — G2581 Restless legs syndrome: Secondary | ICD-10-CM

## 2013-07-12 DIAGNOSIS — F32A Depression, unspecified: Secondary | ICD-10-CM

## 2013-07-12 DIAGNOSIS — F329 Major depressive disorder, single episode, unspecified: Secondary | ICD-10-CM

## 2013-07-12 DIAGNOSIS — E039 Hypothyroidism, unspecified: Secondary | ICD-10-CM

## 2013-07-12 DIAGNOSIS — R05 Cough: Secondary | ICD-10-CM

## 2013-07-12 DIAGNOSIS — R7309 Other abnormal glucose: Secondary | ICD-10-CM

## 2013-07-12 LAB — BASIC METABOLIC PANEL WITH GFR
BUN: 17 mg/dL (ref 6–23)
CHLORIDE: 99 meq/L (ref 96–112)
CO2: 30 meq/L (ref 19–32)
Calcium: 10 mg/dL (ref 8.4–10.5)
Creat: 1.41 mg/dL — ABNORMAL HIGH (ref 0.50–1.10)
GFR, Est African American: 46 mL/min — ABNORMAL LOW
GFR, Est Non African American: 40 mL/min — ABNORMAL LOW
Glucose, Bld: 108 mg/dL — ABNORMAL HIGH (ref 70–99)
POTASSIUM: 4 meq/L (ref 3.5–5.3)
SODIUM: 141 meq/L (ref 135–145)

## 2013-07-12 LAB — HEPATIC FUNCTION PANEL
ALT: 12 U/L (ref 0–35)
AST: 16 U/L (ref 0–37)
Albumin: 4.4 g/dL (ref 3.5–5.2)
Alkaline Phosphatase: 97 U/L (ref 39–117)
BILIRUBIN DIRECT: 0.1 mg/dL (ref 0.0–0.3)
BILIRUBIN INDIRECT: 0.2 mg/dL (ref 0.2–1.2)
BILIRUBIN TOTAL: 0.3 mg/dL (ref 0.2–1.2)
Total Protein: 6.8 g/dL (ref 6.0–8.3)

## 2013-07-12 LAB — FERRITIN: Ferritin: 25 ng/mL (ref 10–291)

## 2013-07-12 LAB — CBC WITH DIFFERENTIAL/PLATELET
Basophils Absolute: 0 10*3/uL (ref 0.0–0.1)
Basophils Relative: 0 % (ref 0–1)
Eosinophils Absolute: 0.5 10*3/uL (ref 0.0–0.7)
Eosinophils Relative: 5 % (ref 0–5)
HCT: 43.5 % (ref 36.0–46.0)
Hemoglobin: 14.8 g/dL (ref 12.0–15.0)
LYMPHS ABS: 2.8 10*3/uL (ref 0.7–4.0)
LYMPHS PCT: 29 % (ref 12–46)
MCH: 27.7 pg (ref 26.0–34.0)
MCHC: 34 g/dL (ref 30.0–36.0)
MCV: 81.5 fL (ref 78.0–100.0)
Monocytes Absolute: 0.8 10*3/uL (ref 0.1–1.0)
Monocytes Relative: 8 % (ref 3–12)
NEUTROS ABS: 5.5 10*3/uL (ref 1.7–7.7)
NEUTROS PCT: 58 % (ref 43–77)
PLATELETS: 359 10*3/uL (ref 150–400)
RBC: 5.34 MIL/uL — AB (ref 3.87–5.11)
RDW: 14.1 % (ref 11.5–15.5)
WBC: 9.5 10*3/uL (ref 4.0–10.5)

## 2013-07-12 LAB — IRON AND TIBC
%SAT: 16 % — AB (ref 20–55)
IRON: 62 ug/dL (ref 42–145)
TIBC: 381 ug/dL (ref 250–470)
UIBC: 319 ug/dL (ref 125–400)

## 2013-07-12 LAB — LIPID PANEL
CHOL/HDL RATIO: 4.9 ratio
CHOLESTEROL: 201 mg/dL — AB (ref 0–200)
HDL: 41 mg/dL (ref >39)
LDL Cholesterol: 123 mg/dL — ABNORMAL HIGH (ref 0–99)
Triglycerides: 187 mg/dL — ABNORMAL HIGH (ref <150)
VLDL: 37 mg/dL (ref 0–40)

## 2013-07-12 LAB — MAGNESIUM: Magnesium: 1.8 mg/dL (ref 1.5–2.5)

## 2013-07-12 LAB — TSH: TSH: 1.187 u[IU]/mL (ref 0.350–4.500)

## 2013-07-12 LAB — VITAMIN B12: Vitamin B-12: 534 pg/mL (ref 211–911)

## 2013-07-12 MED ORDER — EPINEPHRINE 0.15 MG/0.3ML IJ SOAJ: 0.1500 mg | INTRAMUSCULAR | Status: DC | PRN

## 2013-07-12 MED ORDER — GABAPENTIN 300 MG PO CAPS: ORAL_CAPSULE | ORAL | Status: DC

## 2013-07-12 NOTE — Patient Instructions (Signed)
Restless Legs Syndrome Restless legs syndrome is a movement disorder. It may also be called a sensori-motor disorder.  CAUSES  No one knows what specifically causes restless legs syndrome, but it tends to run in families. It is also more common in people with low iron, in pregnancy, in people who need dialysis, and those with nerve damage (neuropathy).Some medications may make restless legs syndrome worse.Those medications include drugs to treat high blood pressure, some heart conditions, nausea, colds, allergies, and depression. SYMPTOMS Symptoms include uncomfortable sensations in the legs. These leg sensations are worse during periods of inactivity or rest. They are also worse while sitting or lying down. Individuals that have the disorder describe sensations in the legs that feel like:  Pulling.  Drawing.  Crawling.  Worming.  Boring.  Tingling.  Pins and needles.  Prickling.  Pain. The sensations are usually accompanied by an overwhelming urge to move the legs. Sudden muscle jerks may also occur. Movement provides temporary relief from the discomfort. In rare cases, the arms may also be affected. Symptoms may interfere with going to sleep (sleep onset insomnia). Restless legs syndrome may also be related to periodic limb movement disorder (PLMD). PLMD is another more common motor disorder. It also causes interrupted sleep. The symptoms from PLMD usually occur most often when you are awake. TREATMENT  Treatment for restless legs syndrome is symptomatic. This means that the symptoms are treated.   Massage and cold compresses may provide temporary relief.  Walk, stretch, or take a cold or hot bath.  Get regular exercise and a good night's sleep.  Avoid caffeine, alcohol, nicotine, and medications that can make it worse.  Do activities that provide mental stimulation like discussions, needlework, and video games. These may be helpful if you are not able to walk or  stretch. Some medications are effective in relieving the symptoms. However, many of these medications have side effects. Ask your caregiver about medications that may help your symptoms. Correcting iron deficiency may improve symptoms for some patients. Document Released: 03/06/2002 Document Revised: 06/08/2011 Document Reviewed: 06/12/2010 Exit[employer] Patient Information 2014 [employer].  

## 2013-07-12 NOTE — Progress Notes (Signed)
HPI 63 y.o. female  presents for 3 month follow up with hypertension, hyperlipidemia, prediabetes and vitamin D. Her blood pressure has been controlled at home, today their BP is BP: 138/84 mmHg She does workout. She denies chest pain, shortness of breath, dizziness.  She is not on cholesterol medication and denies myalgias. Her cholesterol is at goal. The cholesterol last visit was:  LDL 60.  She has been working on diet and exercise for prediabetes, and denies paresthesia of the feet, polydipsia and polyuria. Last A1C in the office was: 5.6 Patient is on Vitamin D supplement.    Patient had injured her left foot several months ago, she saw Dr. Rip Harbour before thanksgiving and he gave her shots of dexamethasone in her left foot and after that she felt like she was having a heart attack, that was ruled out, she went to her psych Dr. Buddy Duty and she felt like she was breaking down. She states she has been drinking pepsi, eating yogurt 5-6 a day.  She has decreased sleep due to rest less legs at night, she states her mom also had this. She is off seroquel and on remeron due to this. She takes 4 xanax daily and has taken one with bed time. She states she has nightmares, sleeps for 1-2 hours, and can not sleep. She has slept a total of 25 hours a week.  Occ she will go without sleep for 2-3 days. She has been picking at her nails and toenails until they bleed. She has very pressured speech. Denies thoughts of suicide.   She states she has deep cough for 1-2 weeks.   Current Medications:  Current Outpatient Prescriptions on File Prior to Visit  Medication Sig Dispense Refill  . albuterol (PROVENTIL,VENTOLIN) 90 MCG/ACT inhaler Inhale 2 puffs into the lungs every 6 (six) hours as needed for wheezing or shortness of breath.       . ALPRAZolam (XANAX) 1 MG tablet Take 1 mg by mouth 4 (four) times daily.       Marland Kitchen aspirin 81 MG EC tablet Take 81 mg by mouth daily.        . calcium carbonate (OS-CAL) 600 MG TABS  Take 600 mg by mouth daily.        . Cholecalciferol (VITAMIN D) 2000 UNITS CAPS Take 2 capsules by mouth daily.       Marland Kitchen EPINEPHrine (EPIPEN JR) 0.15 MG/0.3ML injection Inject 0.15 mg into the muscle as needed.        . ferrous sulfate 325 (65 FE) MG tablet Take 325 mg by mouth daily with breakfast.       . Fluticasone-Salmeterol (ADVAIR DISKUS) 100-50 MCG/DOSE AEPB Inhale 1 puff into the lungs every 12 (twelve) hours.        . furosemide (LASIX) 40 MG tablet Take 40 mg by mouth daily as needed. For swelling      . ipratropium-albuterol (DUONEB) 0.5-2.5 (3) MG/3ML SOLN USE 1 VIAL IN NEBULIZER 4 TIMES A DAY  180 mL  0  . levothyroxine (SYNTHROID, LEVOTHROID) 25 MCG tablet Take 25 mcg by mouth daily before breakfast.       . ondansetron (ZOFRAN) 8 MG tablet Take 1 tablet (8 mg total) by mouth every 8 (eight) hours as needed for nausea or vomiting.  20 tablet  0  . QUEtiapine (SEROQUEL) 100 MG tablet Take 100 mg by mouth 3 (three) times daily.       Marland Kitchen venlafaxine (EFFEXOR) 100 MG tablet Take 100 mg by  mouth 3 (three) times daily.       No current facility-administered medications on file prior to visit.   Medical History:  Past Medical History  Diagnosis Date  . S/P gastric bypass   . Hypokalemia   . Anxiety and depression   . Abdominal pain      status post gastric bypass for bariatric surgical   management.  . Hyperlipidemia   . Hypertension   . Depression   . Anemia   . Anxiety   . Hypothyroid   . Prediabetes    Allergies:  Allergies  Allergen Reactions  . Theophylline Anaphylaxis     Review of Systems: [X]  = complains of  [ ]  = denies  General: Fatigue [ ]  Fever [ ]  Chills [ ]  Weakness [ ]   Insomnia [ ]  Eyes: Redness [ ]  Blurred vision [ ]  Diplopia [ ]   ENT: Congestion [ ]  Sinus Pain [ ]  Post Nasal Drip [ ]  Sore Throat [ ]  Earache [ ]   Cardiac: Chest pain/pressure [ ]  SOB [ ]  Orthopnea [ ]   Palpitations [ ]   Paroxysmal nocturnal dyspnea[ ]  Claudication [ ]  Edema [ ]    Pulmonary: Cough [ ]  Wheezing[ ]   SOB [ ]   Snoring [ ]   GI: Nausea [ ]  Vomiting[ ]  Dysphagia[ ]  Heartburn[ ]  Abdominal pain [ ]  Constipation [ ] ; Diarrhea [ ] ; BRBPR [ ]  Melena[ ]  GU: Hematuria[ ]  Dysuria [ ]  Nocturia[ ]  Urgency [ ]   Hesitancy [ ]  Discharge [ ]  Neuro: Headaches[ ]  Vertigo[ ]  Paresthesias[ ]  Spasm [ ]  Speech changes [ ]  Incoordination [ ]   Ortho: Arthritis [ ]  Joint pain [ ]  Muscle pain [ ]  Joint swelling [ ]  Back Pain [ ]  Skin:  Rash [ ]   Pruritis [ ]  Change in skin lesion [ ]   Psych: Depression[ ]  Anxiety[ ]  Confusion [ ]  Memory loss [ ]   Heme/Lypmh: Bleeding [ ]  Bruising [ ]  Enlarged lymph nodes [ ]   Endocrine: Visual blurring [ ]  Paresthesia [ ]  Polyuria [ ]  Polydypsea [ ]    Heat/cold intolerance [ ]  Hypoglycemia [ ]   Family history- Review and unchanged Social history- Review and unchanged Physical Exam: BP 138/84  Pulse 88  Temp(Src) 97.6 F (36.4 C)  Resp 16  Ht 5' 4.5" (1.638 m)  Wt 192 lb (87.091 kg)  BMI 32.46 kg/m2 Wt Readings from Last 3 Encounters:  07/12/13 192 lb (87.091 kg)  01/13/11 246 lb (111.585 kg)  12/22/10 244 lb 6.4 oz (110.859 kg)   General Appearance: Well nourished, in no apparent distress. Eyes: PERRLA, EOMs, conjunctiva no swelling or erythema Sinuses: No Frontal/maxillary tenderness ENT/Mouth: Ext aud canals clear, TMs without erythema, bulging. No erythema, swelling, or exudate on post pharynx.  Tonsils not swollen or erythematous. Hearing normal.  Neck: Supple, thyroid normal.  Respiratory: Respiratory effort normal, BS equal bilaterally without rales, rhonchi, wheezing or stridor.  Cardio: RRR with no MRGs. Brisk peripheral pulses without edema.  Abdomen: Soft, + BS.  Non tender, no guarding, rebound, hernias, masses. Lymphatics: Non tender without lymphadenopathy.  Musculoskeletal: Full ROM, 5/5 strength, normal gait.  Skin: Warm, dry without rashes, lesions, ecchymosis.  Neuro: Cranial nerves intact. Normal muscle tone, no  cerebellar symptoms. Sensation intact.  Psych: Awake and oriented X 3, normal affect, Insight and Judgment appropriate.   Assessment and Plan:  Hypertension: Continue medication, monitor blood pressure at home.  Continue DASH diet. Cholesterol: Continue diet and exercise. Check cholesterol.  Pre-diabetes-Continue diet and exercise. Check  A1C Vitamin D Def- check level and continue medications.  ? Bipolar/Depression- no suicidal thoughts- suggest going to behavioral health/psychaitris.  Likely OSA- get sleep study- trouble sleeping, fatigue, anxiety, etc RLS- check labs to make sure normal TSH, iron, mag, potassium- and do sleep study, walk, and we can try Gabapentin at night.    Continue diet and meds as discussed. Further disposition pending results of labs.  Vicie Mutters 4:51 PM

## 2013-07-13 LAB — HEMOGLOBIN A1C
Hgb A1c MFr Bld: 5.7 % — ABNORMAL HIGH (ref <5.7)
Mean Plasma Glucose: 117 mg/dL — ABNORMAL HIGH (ref <117)

## 2013-07-17 ENCOUNTER — Other Ambulatory Visit: Payer: Self-pay | Admitting: Emergency Medicine

## 2013-07-17 ENCOUNTER — Telehealth: Payer: Self-pay | Admitting: Internal Medicine

## 2013-07-17 MED ORDER — EPINEPHRINE 0.3 MG/0.3ML IJ SOAJ: 0.3000 mg | Freq: Once | INTRAMUSCULAR | Status: AC

## 2013-07-17 NOTE — Telephone Encounter (Signed)
[facility]pi [facility], rxd jr epi pen

## 2013-07-25 ENCOUNTER — Other Ambulatory Visit: Payer: Self-pay | Admitting: Emergency Medicine

## 2013-08-12 ENCOUNTER — Other Ambulatory Visit: Payer: Self-pay | Admitting: Internal Medicine

## 2013-08-12 ENCOUNTER — Other Ambulatory Visit: Payer: Self-pay | Admitting: Physician Assistant

## 2013-08-16 ENCOUNTER — Ambulatory Visit: Payer: Self-pay

## 2013-08-23 ENCOUNTER — Ambulatory Visit: Payer: Self-pay

## 2013-08-30 ENCOUNTER — Ambulatory Visit (HOSPITAL_BASED_OUTPATIENT_CLINIC_OR_DEPARTMENT_OTHER): Payer: Medicare Other

## 2013-08-31 ENCOUNTER — Ambulatory Visit (HOSPITAL_BASED_OUTPATIENT_CLINIC_OR_DEPARTMENT_OTHER): Payer: Medicare Other | Attending: Internal Medicine | Admitting: Radiology

## 2013-08-31 VITALS — Ht 74.0 in

## 2013-08-31 DIAGNOSIS — R0683 Snoring: Secondary | ICD-10-CM

## 2013-08-31 DIAGNOSIS — G4700 Insomnia, unspecified: Secondary | ICD-10-CM | POA: Insufficient documentation

## 2013-08-31 DIAGNOSIS — G4761 Periodic limb movement disorder: Secondary | ICD-10-CM | POA: Insufficient documentation

## 2013-08-31 DIAGNOSIS — G4719 Other hypersomnia: Secondary | ICD-10-CM

## 2013-08-31 DIAGNOSIS — I4949 Other premature depolarization: Secondary | ICD-10-CM | POA: Insufficient documentation

## 2013-08-31 DIAGNOSIS — R0609 Other forms of dyspnea: Secondary | ICD-10-CM | POA: Insufficient documentation

## 2013-08-31 DIAGNOSIS — G4730 Sleep apnea, unspecified: Principal | ICD-10-CM

## 2013-08-31 DIAGNOSIS — R0989 Other specified symptoms and signs involving the circulatory and respiratory systems: Secondary | ICD-10-CM | POA: Insufficient documentation

## 2013-09-03 DIAGNOSIS — G4710 Hypersomnia, unspecified: Secondary | ICD-10-CM

## 2013-09-03 DIAGNOSIS — G4730 Sleep apnea, unspecified: Secondary | ICD-10-CM

## 2013-09-03 DIAGNOSIS — G4700 Insomnia, unspecified: Secondary | ICD-10-CM

## 2013-09-03 NOTE — Sleep Study (Signed)
   NAME: Lauren Mckenzie DATE OF BIRTH:  05-Jan-1951 MEDICAL RECORD NUMBER [id number]  LOCATION: [facility]  PHYSICIAN: Clinton D Young  DATE OF STUDY: 08/31/2013  SLEEP STUDY TYPE: Nocturnal Polysomnogram               REFERRING PHYSICIAN: Unk Pinto, MD  INDICATION FOR STUDY: Insomnia with sleep apnea  EPWORTH SLEEPINESS SCORE:   2/24  HEIGHT: 5 feet 5 inches WEIGHT: 190 pounds    BMI 32 Neck : 14.5 inches.  MEDICATIONS: Charted for review  SLEEP ARCHITECTURE: Total sleep time 359.5 minutes with sleep efficiency 91.1%. Stage I was 9.3%, stage II 90.7%, stage III and REM were absent. Sleep latency 3 minutes, awake after sleep onset 32 minutes, arousal index 23.4. Bedtime medication: Remeron. The sleep pattern looks medicated, with predominance of stage II sleep and absent REM.  RESPIRATORY DATA: Apnea hypopneas index (AHI) 1.5 per hour. 9 total events scored, all his obstructive apneas , mostly while supine. There were not enough events to meet protocol requirements for split CPAP titration.  OXYGEN DATA: Very loud snoring with oxygen desaturation to a nadir of 91% and mean oxygen saturation through the study of 95.1% on room air.  CARDIAC DATA: Sinus rhythm with PVCs  MOVEMENT/PARASOMNIA: Periodic limb movement with arousal syndrome. 409 limb jerks were counted of which 84 were associated with arousal or wakening for periodic limb movement with arousal index of 14 per hour. No bathroom trips.  IMPRESSION/ RECOMMENDATION:   1) Sleep architecture suggests medication effect with absent REM and stage III, predominantly stage II. Remeron was taken at bedtime. 2) Occasional respiratory events with sleep disturbance, within normal limits. AHI 1.5 per hour, reflecting a total of 9 obstructive apneas, mostly while supine. The normal range for adults is an AHI of 0-5 per hour. 3) Very loud snoring with oxygen desaturation to a nadir of 91% and mean oxygen  saturation through the study of 95.1% on room air. Given the absence of enough scoreable respiratory events to meet diagnostic criteria as Sleep Apnea, it may help to try to relieve the snoring. If there is upper airway obstruction-rhinitis, turbinate hypertrophy, tonsils hypertrophy, etc. then ENT evaluation might be helpful. Encourage weight loss and sleep off flat of back. 4) Periodic limb movement with arousal syndrome. 409 total limb jerks were counted, of which 84 were associated with arousal or awakening for a periodic limb movement with arousal index of 14 per hour. A therapeutic trial of specific intervention such as Requip or Mirapex might be considered if appropriate.  Signed Baird Lyons, M.D. [place], [employer] [facility] of Sleep Medicine  ELECTRONICALLY SIGNED ON:  09/03/2013, 4:34 PM [place] PH: [phone]   FX: [phone] [place]

## 2013-09-06 ENCOUNTER — Other Ambulatory Visit: Payer: Self-pay

## 2013-09-06 DIAGNOSIS — J310 Chronic rhinitis: Secondary | ICD-10-CM

## 2013-09-28 DIAGNOSIS — D3130 Benign neoplasm of unspecified choroid: Secondary | ICD-10-CM | POA: Insufficient documentation

## 2013-09-28 DIAGNOSIS — H25819 Combined forms of age-related cataract, unspecified eye: Secondary | ICD-10-CM | POA: Insufficient documentation

## 2013-10-12 ENCOUNTER — Ambulatory Visit: Payer: Self-pay | Admitting: Physician Assistant

## 2013-10-12 ENCOUNTER — Other Ambulatory Visit: Payer: Self-pay | Admitting: Physician Assistant

## 2013-10-18 ENCOUNTER — Encounter: Payer: Self-pay | Admitting: Physician Assistant

## 2013-10-18 ENCOUNTER — Ambulatory Visit (INDEPENDENT_AMBULATORY_CARE_PROVIDER_SITE_OTHER): Payer: Medicare Other | Admitting: Physician Assistant

## 2013-10-18 VITALS — BP 132/78 | HR 80 | Temp 98.1°F | Resp 16 | Wt 205.0 lb

## 2013-10-18 DIAGNOSIS — Z23 Encounter for immunization: Secondary | ICD-10-CM

## 2013-10-18 DIAGNOSIS — E785 Hyperlipidemia, unspecified: Secondary | ICD-10-CM

## 2013-10-18 DIAGNOSIS — R7303 Prediabetes: Secondary | ICD-10-CM

## 2013-10-18 DIAGNOSIS — Z9181 History of falling: Secondary | ICD-10-CM

## 2013-10-18 DIAGNOSIS — F32A Depression, unspecified: Secondary | ICD-10-CM

## 2013-10-18 DIAGNOSIS — I10 Essential (primary) hypertension: Secondary | ICD-10-CM

## 2013-10-18 DIAGNOSIS — E039 Hypothyroidism, unspecified: Secondary | ICD-10-CM

## 2013-10-18 DIAGNOSIS — F329 Major depressive disorder, single episode, unspecified: Secondary | ICD-10-CM

## 2013-10-18 DIAGNOSIS — Z0000 Encounter for general adult medical examination without abnormal findings: Secondary | ICD-10-CM

## 2013-10-18 MED ORDER — GABAPENTIN 300 MG PO CAPS: ORAL_CAPSULE | ORAL | Status: DC

## 2013-10-18 NOTE — Progress Notes (Signed)
MEDICARE ANNUAL WELLNESS VISIT AND FOLLOW UP  Assessment:   1. Essential hypertension - continue medications, DASH diet, exercise and monitor at home. Call if greater than 130/80.   2. Hypothyroidism, unspecified hypothyroidism type  continue medications the same.   3. Prediabetes Discussed general issues about diabetes pathophysiology and management., Educational material distributed., Suggested low cholesterol diet., Encouraged aerobic exercise., Discussed foot care., Reminded to get yearly retinal exam. - [facility] DIABETES FOOT EXAM  4. Depression -continue meds and continue follow up with psych, no suicidal ideations  5. Hyperlipidemia - decrease fatty foods, increase activity.   6. Need for prophylactic vaccination with combined diphtheria-tetanus-pertussis (DTP) vaccine - Dt vaccine greater than 7yo IM  Patient did not want any labs this visit, she is not cholesterol medication, will follow up at [facility]   Plan:   During the course of the visit the patient was educated and counseled about appropriate screening and preventive services including:    Pneumococcal vaccine   Influenza vaccine  Td vaccine  Screening electrocardiogram  Screening mammography  Bone densitometry screening  Colorectal cancer screening  Diabetes screening  Glaucoma screening  Nutrition counseling   Advanced directives: given info/requested  Screening recommendations, referrals:  Vaccinations: Tdap vaccine ordered Influenza vaccine up to date Pneumococcal vaccine up to date Shingles vaccine declined Hep B vaccine not indicated  Nutrition assessed and recommended  Colonoscopy up to date Mammogram up todate Pap smear not indicated Pelvic exam not indicated Recommended yearly ophthalmology/optometry visit for glaucoma screening and checkup Recommended yearly dental visit for hygiene and checkup Advanced directives - requested  Conditions/risks identified: BMI: Discussed weight  loss, diet, and increase physical activity.  Increase physical activity: AHA recommends 150 minutes of physical activity a week.  Medications reviewed DEXA- requested Diabetes is at goal, ACE/ARB therapy: Yes. Urinary Incontinence is not an issue: discussed non pharmacology and pharmacology options.  Fall risk: low- discussed PT, home fall assessment, medications.    Subjective:   Lauren Mckenzie is a 63 y.o. female who presents for Medicare Annual Wellness Visit and 3 month follow up on hypertension, prediabetes, hyperlipidemia, vitamin D def.  Date of last medicare wellness visit is unknown.   Her blood pressure has been controlled at home, today their BP is BP: 132/78 mmHg She does workout. She denies chest pain, shortness of breath, dizziness.  She is not on cholesterol medication and denies myalgias. Her cholesterol is at goal. The cholesterol last visit was:   Lab Results  Component Value Date   CHOL 201* 07/12/2013   HDL 41 07/12/2013   LDLCALC 123* 07/12/2013   TRIG 187* 07/12/2013   CHOLHDL 4.9 07/12/2013   She has been working on diet and exercise for prediabetes, and denies paresthesia of the feet, polydipsia and polyuria. Last A1C in the office was:  Lab Results  Component Value Date   HGBA1C 5.7* 07/12/2013   Patient is on Vitamin D supplement. On disability due to depression since 2005.  Had a right stress fracture/ankle fracture.  Recently seeing Dr. 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 But she is going to go on the 31st  She is on Gabapentin for RLS and states it is helping, she is taking 1-2 at night.  She had a negative sleep study.  She is on thyroid medication. Her medication was not changed last visit. Patient denies nervousness, palpitations and weight changes.  Lab  Results  Component Value Date   TSH 1.187 07/12/2013  .     Names of Other Physician/Practitioners you  currently use: 1. [facility]- here for primary care 2. Dr. Katy Fitch eye doctor, last visit recent 3. Dr. Sallyanne Havers at [facility]- eye specialist- ? Ocular melanolma 3. None dentist, last visit unknown Patient Care Team: Unk Pinto, MD as PCP - General (Internal Medicine) Beryle Beams, MD as Consulting Physician (Gastroenterology) Larey Dresser, MD as Consulting Physician (Cardiology) Gentry Fitz, MD as Consulting Physician (Family Medicine) Rupinder Charm Rings, MD as Consulting Physician (Psychiatry) Deneise Lever, MD as Consulting Physician (Pulmonary Disease)  Medication Review Current Outpatient Prescriptions on File Prior to Visit  Medication Sig Dispense Refill  . albuterol (PROVENTIL,VENTOLIN) 90 MCG/ACT inhaler Inhale 2 puffs into the lungs every 6 (six) hours as needed for wheezing or shortness of breath.       . ALPRAZolam (XANAX) 1 MG tablet Take 1 mg by mouth 4 (four) times daily.       Marland Kitchen aspirin 81 MG EC tablet Take 81 mg by mouth daily.        . calcium carbonate (OS-CAL) 600 MG TABS Take 600 mg by mouth daily.        . Cholecalciferol (VITAMIN D) 2000 UNITS CAPS Take 2 capsules by mouth daily.       Marland Kitchen EPINEPHrine (EPI-PEN) 0.3 mg/0.3 mL SOAJ injection Inject 0.3 mLs (0.3 mg total) into the muscle once.  2 Device  1  . ferrous sulfate 325 (65 FE) MG tablet Take 325 mg by mouth daily with breakfast.       . Fluticasone-Salmeterol (ADVAIR DISKUS) 100-50 MCG/DOSE AEPB Inhale 1 puff into the lungs every 12 (twelve) hours.        . furosemide (LASIX) 40 MG tablet TAKE 1 TABLET BY MOUTH TWICE A DAY  60 tablet  3  . gabapentin (NEURONTIN) 300 MG capsule TAKE 1-2 CAPSULES AT NIGHT  60 capsule  1  . ipratropium-albuterol (DUONEB) 0.5-2.5 (3) MG/3ML SOLN USE 1 VIAL IN NEBULIZER 4 TIMES A DAY  180 mL  1  . levothyroxine (SYNTHROID, LEVOTHROID) 50 MCG tablet TAKE 1 TABLET BY MOUTH ONCE DAILY  90 tablet  3  . mirtazapine (REMERON) 15 MG  tablet Take 15 mg by mouth at bedtime.      . ondansetron (ZOFRAN) 8 MG tablet Take 1 tablet (8 mg total) by mouth every 8 (eight) hours as needed for nausea or vomiting.  20 tablet  0  . QUEtiapine (SEROQUEL) 100 MG tablet Take 100 mg by mouth 3 (three) times daily.       Marland Kitchen venlafaxine (EFFEXOR) 100 MG tablet Take 100 mg by mouth 3 (three) times daily.       No current facility-administered medications on file prior to visit.    Current Problems (verified) Patient Active Problem List   Diagnosis Date Noted  . Hypertension   . Depression   . Anemia   . Anxiety   . Hypothyroid   . Prediabetes   . Dyspnea on exertion 07/07/2010  . Hyperlipidemia 07/07/2010  . Asthma, chronic 03/17/2007    Screening Tests Health Maintenance  Topic Date Due  . Pap Smear  08/24/1967  . Tetanus/tdap  08/23/1968  . Zostavax  08/23/2009  . Influenza Vaccine  10/28/2013  . Mammogram  01/17/2014  . Colonoscopy  02/02/2021     Immunization History  Administered Date(s) Administered  . Influenza Split 01/04/2013  .  Pneumococcal Polysaccharide-23 12/23/2011    Preventative care: Last colonoscopy: 2012 due 10 years.  Last mammogram: 01/2013 3D  Last pap smear/pelvic exam:  DEXA:12/2011 due 2015 Oct Echo 06/2010  Prior vaccinations TD or Tdap: DUE  Influenza: 2014 Pneumococcal: 2013 Can get Prevnar at [facility] Shingles/Zostavax: declines  History reviewed: allergies, current medications, past family history, past medical history, past social history, past surgical history and problem list  Risk Factors: Osteoporosis: postmenopausal estrogen deficiency and dietary calcium and/or vitamin D deficiency History of fracture in the past year: yes  Tobacco History  Substance Use Topics  . Smoking status: Never Smoker   . Smokeless tobacco: Never Used  . Alcohol Use: No   She does not smoke.  Patient is not a former smoker. Are there smokers in your home (other than you)?  No  Alcohol Current  alcohol use: none  Caffeine Current caffeine use: coffee 2 /day  Exercise Current exercise: none  Nutrition/Diet Current diet: in general, a "healthy" diet    Cardiac risk factors: dyslipidemia, hypertension, obesity (BMI >= 30 kg/m2) and sedentary lifestyle.  Depression Screen (Note: if answer to either of the following is "Yes", a more complete depression screening is indicated)   Q1: Over the past two weeks, have you felt down, depressed or hopeless? Yes  Q2: Over the past two weeks, have you felt little interest or pleasure in doing things? Yes  Have you lost interest or pleasure in daily life? Yes  Do you often feel hopeless? Yes  Do you cry easily over simple problems? Yes  Activities of Daily Living In your present state of health, do you have any difficulty performing the following activities?:  Driving? No Managing money?  No Feeding yourself? No Getting from bed to chair? No Climbing a flight of stairs? No Preparing food and eating?: No Bathing or showering? No Getting dressed: No Getting to the toilet? No Using the toilet:No Moving around from place to place: No In the past year have you fallen or had a near fall?:No   Are you sexually active?  Yes  Do you have more than one partner?  No  Vision Difficulties: Yes  Hearing Difficulties: No Do you often ask people to speak up or repeat themselves? No Do you experience ringing or noises in your ears? No Do you have difficulty understanding soft or whispered voices? No  Cognition  Do you feel that you have a problem with memory?No  Do you often misplace items? No  Do you feel safe at home?  Yes  Advanced directives Does patient have a [place]? Yes Does patient have a Living Will? Yes   Objective:   Blood pressure 132/78, pulse 80, temperature 98.1 F (36.7 C), resp. rate 16, weight 205 lb (92.987 kg). Body mass index is 26.31 kg/(m^2).  General appearance: alert, no distress,  WD/WN,  female Cognitive Testing  Alert? Yes  Normal Appearance?Yes  Oriented to person? Yes  Place? Yes   Time? Yes  Recall of three objects?  Yes  Can perform simple calculations? Yes  Displays appropriate judgment?Yes  Can read the correct time from a watch face?Yes  HEENT: normocephalic, sclerae anicteric, TMs pearly, nares patent, no discharge or erythema, pharynx normal Oral cavity: MMM, no lesions Neck: supple, no lymphadenopathy, no thyromegaly, no masses Heart: RRR, normal S1, S2, no murmurs Lungs: CTA bilaterally, no wheezes, rhonchi, or rales Abdomen: +bs, soft, non tender, non distended, no masses, no hepatomegaly, no splenomegaly Musculoskeletal: nontender,  no swelling, no obvious deformity Extremities: no edema, no cyanosis, no clubbing Pulses: 2+ symmetric, upper and lower extremities, normal cap refill Neurological: alert, oriented x 3, CN2-12 intact, strength normal upper extremities and lower extremities, sensation normal throughout, DTRs 2+ throughout, no cerebellar signs, gait normal Psychiatric: normal affect, behavior normal, pleasant  Breast: defer Gyn: defer Rectal: defer  Medicare Attestation I have personally reviewed: The patient's medical and social history Their use of alcohol, tobacco or illicit drugs Their current medications and supplements The patient's functional ability including ADLs,fall risks, home safety risks, cognitive, and hearing and visual impairment Diet and physical activities Evidence for depression or mood disorders  The patient's weight, height, BMI, and visual acuity have been recorded in the chart.  I have made referrals, counseling, and provided education to the patient based on review of the above and I have provided the patient with a written personalized care plan for preventive services.     Vicie Mutters, PA-C   10/18/2013

## 2013-10-18 NOTE — Patient Instructions (Signed)

## 2014-01-24 ENCOUNTER — Encounter: Payer: Self-pay | Admitting: Internal Medicine

## 2014-02-03 ENCOUNTER — Other Ambulatory Visit: Payer: Self-pay | Admitting: Internal Medicine

## 2014-02-12 ENCOUNTER — Other Ambulatory Visit: Payer: Self-pay | Admitting: Internal Medicine

## 2014-02-12 DIAGNOSIS — Z1231 Encounter for screening mammogram for malignant neoplasm of breast: Secondary | ICD-10-CM

## 2014-03-19 ENCOUNTER — Encounter: Payer: Self-pay | Admitting: Internal Medicine

## 2014-03-19 ENCOUNTER — Ambulatory Visit (INDEPENDENT_AMBULATORY_CARE_PROVIDER_SITE_OTHER): Payer: Medicare Other | Admitting: Internal Medicine

## 2014-03-19 VITALS — BP 126/74 | HR 72 | Temp 98.1°F | Resp 16 | Ht 63.5 in | Wt 221.0 lb

## 2014-03-19 DIAGNOSIS — E559 Vitamin D deficiency, unspecified: Secondary | ICD-10-CM

## 2014-03-19 DIAGNOSIS — Z1212 Encounter for screening for malignant neoplasm of rectum: Secondary | ICD-10-CM

## 2014-03-19 DIAGNOSIS — Z79899 Other long term (current) drug therapy: Secondary | ICD-10-CM | POA: Insufficient documentation

## 2014-03-19 DIAGNOSIS — R7303 Prediabetes: Secondary | ICD-10-CM

## 2014-03-19 DIAGNOSIS — E039 Hypothyroidism, unspecified: Secondary | ICD-10-CM

## 2014-03-19 DIAGNOSIS — Z0001 Encounter for general adult medical examination with abnormal findings: Secondary | ICD-10-CM

## 2014-03-19 DIAGNOSIS — Z1331 Encounter for screening for depression: Secondary | ICD-10-CM

## 2014-03-19 DIAGNOSIS — Z9181 History of falling: Secondary | ICD-10-CM

## 2014-03-19 DIAGNOSIS — E785 Hyperlipidemia, unspecified: Secondary | ICD-10-CM

## 2014-03-19 DIAGNOSIS — B372 Candidiasis of skin and nail: Secondary | ICD-10-CM

## 2014-03-19 DIAGNOSIS — L239 Allergic contact dermatitis, unspecified cause: Secondary | ICD-10-CM

## 2014-03-19 DIAGNOSIS — R5383 Other fatigue: Secondary | ICD-10-CM

## 2014-03-19 DIAGNOSIS — I10 Essential (primary) hypertension: Secondary | ICD-10-CM

## 2014-03-19 MED ORDER — FLUCONAZOLE 150 MG PO TABS: ORAL_TABLET | ORAL | Status: DC

## 2014-03-19 MED ORDER — PHENTERMINE HCL 37.5 MG PO TABS: ORAL_TABLET | ORAL | Status: DC

## 2014-03-19 MED ORDER — TRIAMCINOLONE ACETONIDE 0.5 % EX OINT: TOPICAL_OINTMENT | CUTANEOUS | Status: DC

## 2014-03-19 NOTE — Patient Instructions (Signed)

## 2014-03-19 NOTE — Progress Notes (Signed)
Patient ID: Lauren Mckenzie, female   DOB: May 07, 1950, 63 y.o.   MRN: [id number]  Annual Screening/Preventative Comprehensive Examination  This very nice 63 y.o.DWF presents for complete physical.  Patient has been followed for HTN, COPD/Asthma,  Hypothyroidism,  Morbid Obesity, Prediabetes, Hyperlipidemia, and Vitamin D Deficiency.    HTN predates 20 + years. Patient's BP has been controlled at home and patient denies any cardiac symptoms as chest pain, palpitations, shortness of breath, dizziness or ankle swelling. Today's BP: 126/74 mmHg    Patient's hyperlipidemia is controlled with diet and medications. Patient denies myalgias or other medication SE's. Last lipids were not at goal  - Total  Chol 201*; HDL  41; LDL  123*; Trig 187 on 07/12/2013.   Patient has  Morbid Obesity (BMI 38.53) and prediabetes predating since Jan 2012 with A1c 5.7% . Patient has failed Gastric bipass surg gaining back all of the weight that she initially lost. Patient denies reactive hypoglycemic symptoms, visual blurring, diabetic polys, or paresthesias. Last A1c was  5.7% on 07/12/2013.   Finally, patient has history of Vitamin D Deficiency and last Vitamin D was 63 in Oct 2014.  Medication Sig  . ALPRAZolam (XANAX) 1 MG tablet Take 1 mg by mouth 4 (four) times daily.   Marland Kitchen aspirin 81 MG EC tablet Take 81 mg by mouth daily.    . OS-CAL 600 MG TABS Take 600 mg by mouth daily.    Marland Kitchen VITAMIN D 2000 UNITS  Take 2 capsules by mouth daily.   . EPI-PEN 0.3 mg/0.3 mL  inj Inject 0.3 mLs (0.3 mg total) into the muscle once.  . ferrous sulfate 325 (65 FE) MG tab Take 325 mg by mouth daily with breakfast.   . ADVAIR DISKUS 100-50  Inhale 1 puff into the lungs every 12 (twelve) hours.    . furosemide (LASIX) 40 MG tablet TAKE 1 TABLET BY MOUTH TWICE A DAY  . gabapentin (NEURONTIN) 300 MG  Take one capsule three times daily or as directed  . ipratropium-albuterol (DUONEB) 0.5-2.5 (3) MG/3ML SOLN USE 1 VIAL IN NEBULIZER 4 TIMES A  DAY  . levothyroxine  50 MCG tablet TAKE 1 TABLET BY MOUTH ONCE DAILY  . PROAIR HFA  inhaler INHALE TWO PUFFS BY MOUTH 4 TIMES DAILY TO EVERY 4 HOURS  . venlafaxine (EFFEXOR) 100 MG tablet Take 100 mg by mouth 3 (three) times daily.   Allergies  Allergen Reactions  . Shellfish Allergy Anaphylaxis  . Theophylline Anaphylaxis   Past Medical History  Diagnosis Date  . S/P gastric bypass   . Hypokalemia   . Anxiety and depression   . Abdominal pain      status post gastric bypass for bariatric surgical   management.  . Hyperlipidemia   . Hypertension   . Depression   . Anemia   . Anxiety   . Hypothyroid   . Prediabetes    Health Maintenance  Topic Date Due  . TETANUS/TDAP  08/23/1968  . ZOSTAVAX  08/23/2009  . INFLUENZA VACCINE  10/28/2013  . MAMMOGRAM  01/17/2014  . COLONOSCOPY  02/02/2021   Immunization History  Administered Date(s) Administered  . DT 10/18/2013  . Influenza Split 01/04/2013  . Pneumococcal Polysaccharide-23 12/23/2011   Past Surgical History  Procedure Laterality Date  . Gastric bypass    . Cholecystectomy    . Tubal ligation    . Cervical fusion      C6-7   Family History  Problem Relation Age of Onset  .  Heart attack Mother   . Heart attack Father    History  Substance Use Topics  . Smoking status: Never Smoker   . Smokeless tobacco: Never Used  . Alcohol Use: No    ROS Constitutional: Denies fever, chills, weight loss/gain, headaches, insomnia, fatigue, night sweats, and change in appetite. Eyes: Denies redness, blurred vision, diplopia, discharge, itchy, watery eyes.  ENT: Denies discharge, congestion, post nasal drip, epistaxis, sore throat, earache, hearing loss, dental pain, Tinnitus, Vertigo, Sinus pain, snoring.  Cardio: Denies chest pain, palpitations, irregular heartbeat, syncope, dyspnea, diaphoresis, orthopnea, PND, claudication, edema Respiratory: denies cough, dyspnea, DOE, pleurisy, hoarseness, laryngitis, wheezing.   Gastrointestinal: Denies dysphagia, heartburn, reflux, water brash, pain, cramps, nausea, vomiting, bloating, diarrhea, constipation, hematemesis, melena, hematochezia, jaundice, hemorrhoids Genitourinary: Denies dysuria, frequency, urgency, nocturia, hesitancy, discharge, hematuria, flank pain Breast: Breast lumps, nipple discharge, bleeding.  Musculoskeletal: Denies arthralgia, myalgia, stiffness, Jt. Swelling, pain, limp, and strain/sprain. Denies falls. Skin: Denies puritis, rash, hives, warts, acne, eczema, changing in skin lesion Neuro: No weakness, tremor, incoordination, spasms, paresthesia, pain Psychiatric: Denies confusion, memory loss, sensory loss. Denies Depression. Endocrine: Denies change in weight, skin, hair change, nocturia, and paresthesia, diabetic polys, visual blurring, hyper / hypo glycemic episodes.  Heme/Lymph: No excessive bleeding, bruising, enlarged lymph nodes.  Physical Exam  BP 126/74   Pulse 72  Temp 98.1 F   Resp 16  Ht 5' 3.5"  Wt 221 lb          BMI 38.53   General Appearance: Over  Nourished with central obesity  and in no apparent distress. Eyes: PERRLA, EOMs, conjunctiva no swelling or erythema, normal fundi and vessels. Sinuses: No frontal/maxillary tenderness ENT/Mouth: EACs patent / TMs  nl. Nares clear without erythema, swelling, mucoid exudates. Oral hygiene is good. No erythema, swelling, or exudate. Tongue normal, non-obstructing. Tonsils not swollen or erythematous. Hearing normal.  Neck: Supple, thyroid normal. No bruits, nodes or JVD. Respiratory: Respiratory effort normal.  BS equal and clear bilateral without rales, rhonci, wheezing or stridor. Cardio: Heart sounds are normal with regular rate and rhythm and no murmurs, rubs or gallops. Peripheral pulses are normal and equal bilaterally without edema. No aortic or femoral bruits. Chest: symmetric with normal excursions and percussion. Breasts: Symmetric, without lumps, nipple  discharge, retractions, or fibrocystic changes.  Abdomen: Flat, soft, with bowl sounds. Nontender, no guarding, rebound, hernias, masses, or organomegaly.  Lymphatics: Non tender without lymphadenopathy.  Genitourinary:  Musculoskeletal: Full ROM all peripheral extremities, joint stability, 5/5 strength, and normal gait. Skin: Warm and dry without rashes, lesions, cyanosis, clubbing or  ecchymosis.  Neuro: Cranial nerves intact, reflexes equal bilaterally. Normal muscle tone, no cerebellar symptoms. Sensation intact.  Pysch: Awake and oriented X 3, normal affect, Insight and Judgment appropriate.   Assessment and Plan  1. Encounter for general adult medical examination with abnormal findings   2. Essential hypertension  - EKG 12-Lead - [place], RETROPERITNL ABD,  LTD - TSH  3. Hyperlipidemia  - Lipid panel  4. Prediabetes  - Hemoglobin A1c - Insulin, fasting  5. Hypothyroidism, unspecified hypothyroidism type   6. Vitamin D deficiency  - Vit D  25 hydroxy (rtn osteoporosis monitoring)  7. Screening for rectal cancer  - POC Hemoccult Bld/Stl (3-Cd Home Screen); Future  8. Screening for depression   9. At low risk for fall   10. Other fatigue   11. Medication management  - Urine Microscopic - CBC with Differential - BASIC METABOLIC PANEL WITH GFR - Hepatic function  panel - Magnesium  12. 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 Discussed med's effects and SE's. Screening labs and tests as requested with regular follow-up as recommended.

## 2014-03-20 LAB — CBC WITH DIFFERENTIAL/PLATELET
Basophils Absolute: 0 10*3/uL (ref 0.0–0.1)
Basophils Relative: 0 % (ref 0–1)
EOS PCT: 6 % — AB (ref 0–5)
Eosinophils Absolute: 0.4 10*3/uL (ref 0.0–0.7)
HEMATOCRIT: 41.3 % (ref 36.0–46.0)
Hemoglobin: 13.3 g/dL (ref 12.0–15.0)
LYMPHS PCT: 30 % (ref 12–46)
Lymphs Abs: 2.1 10*3/uL (ref 0.7–4.0)
MCH: 27.9 pg (ref 26.0–34.0)
MCHC: 32.2 g/dL (ref 30.0–36.0)
MCV: 86.8 fL (ref 78.0–100.0)
MONOS PCT: 7 % (ref 3–12)
MPV: 8.7 fL — ABNORMAL LOW (ref 9.4–12.4)
Monocytes Absolute: 0.5 10*3/uL (ref 0.1–1.0)
NEUTROS ABS: 3.9 10*3/uL (ref 1.7–7.7)
Neutrophils Relative %: 57 % (ref 43–77)
Platelets: 248 10*3/uL (ref 150–400)
RBC: 4.76 MIL/uL (ref 3.87–5.11)
RDW: 14.2 % (ref 11.5–15.5)
WBC: 6.9 10*3/uL (ref 4.0–10.5)

## 2014-03-20 LAB — LIPID PANEL
Cholesterol: 191 mg/dL (ref 0–200)
HDL: 47 mg/dL (ref >39)
LDL CALC: 103 mg/dL — AB (ref 0–99)
Total CHOL/HDL Ratio: 4.1 Ratio
Triglycerides: 204 mg/dL — ABNORMAL HIGH (ref <150)
VLDL: 41 mg/dL — AB (ref 0–40)

## 2014-03-20 LAB — BASIC METABOLIC PANEL WITH GFR
BUN: 17 mg/dL (ref 6–23)
CO2: 30 mEq/L (ref 19–32)
CREATININE: 1.39 mg/dL — AB (ref 0.50–1.10)
Calcium: 9.5 mg/dL (ref 8.4–10.5)
Chloride: 98 mEq/L (ref 96–112)
GFR, Est African American: 46 mL/min — ABNORMAL LOW
GFR, Est Non African American: 40 mL/min — ABNORMAL LOW
GLUCOSE: 85 mg/dL (ref 70–99)
POTASSIUM: 4 meq/L (ref 3.5–5.3)
Sodium: 139 mEq/L (ref 135–145)

## 2014-03-20 LAB — URINALYSIS, MICROSCOPIC ONLY
Bacteria, UA: NONE SEEN
Casts: NONE SEEN
Crystals: NONE SEEN
SQUAMOUS EPITHELIAL / LPF: NONE SEEN

## 2014-03-20 LAB — HEPATIC FUNCTION PANEL
ALT: 9 U/L (ref 0–35)
AST: 14 U/L (ref 0–37)
Albumin: 4.2 g/dL (ref 3.5–5.2)
Alkaline Phosphatase: 107 U/L (ref 39–117)
Bilirubin, Direct: <0.1 mg/dL (ref 0.0–0.3)
Total Bilirubin: 0.2 mg/dL (ref 0.2–1.2)
Total Protein: 6.7 g/dL (ref 6.0–8.3)

## 2014-03-20 LAB — VITAMIN D 25 HYDROXY (VIT D DEFICIENCY, FRACTURES): Vit D, 25-Hydroxy: 54 ng/mL (ref 30–100)

## 2014-03-20 LAB — HEMOGLOBIN A1C
Hgb A1c MFr Bld: 5.5 % (ref <5.7)
MEAN PLASMA GLUCOSE: 111 mg/dL (ref <117)

## 2014-03-20 LAB — MAGNESIUM: Magnesium: 2.1 mg/dL (ref 1.5–2.5)

## 2014-03-20 LAB — INSULIN, FASTING: Insulin fasting, serum: 7.9 u[IU]/mL (ref 2.0–19.6)

## 2014-03-20 LAB — TSH: TSH: 2.066 u[IU]/mL (ref 0.350–4.500)

## 2014-03-24 ENCOUNTER — Other Ambulatory Visit: Payer: Self-pay | Admitting: Physician Assistant

## 2014-03-28 ENCOUNTER — Ambulatory Visit (HOSPITAL_COMMUNITY): Payer: Medicare Other

## 2014-04-18 ENCOUNTER — Ambulatory Visit (HOSPITAL_COMMUNITY): Payer: Medicare Other

## 2014-04-18 ENCOUNTER — Ambulatory Visit (HOSPITAL_COMMUNITY)
Admission: RE | Admit: 2014-04-18 | Discharge: 2014-04-18 | Disposition: A | Discharge status: Home / Self Care | Payer: Medicare Other | Source: Ambulatory Visit | Attending: Internal Medicine | Admitting: Internal Medicine

## 2014-04-18 DIAGNOSIS — Z1231 Encounter for screening mammogram for malignant neoplasm of breast: Secondary | ICD-10-CM | POA: Diagnosis not present

## 2014-05-16 ENCOUNTER — Other Ambulatory Visit: Payer: Self-pay | Admitting: Internal Medicine

## 2014-05-16 DIAGNOSIS — M5440 Lumbago with sciatica, unspecified side: Secondary | ICD-10-CM

## 2014-05-16 MED ORDER — LIDOCAINE 5 % EX PTCH: 1.0000 | MEDICATED_PATCH | CUTANEOUS | Status: DC

## 2014-06-13 ENCOUNTER — Other Ambulatory Visit: Payer: Self-pay | Admitting: Internal Medicine

## 2014-06-27 ENCOUNTER — Ambulatory Visit: Payer: Self-pay | Admitting: Physician Assistant

## 2014-07-20 ENCOUNTER — Other Ambulatory Visit: Payer: Self-pay | Admitting: *Deleted

## 2014-07-20 MED ORDER — GABAPENTIN 300 MG PO CAPS: 300.0000 mg | ORAL_CAPSULE | Freq: Four times a day (QID) | ORAL | Status: DC

## 2014-08-07 ENCOUNTER — Other Ambulatory Visit: Payer: Self-pay | Admitting: Internal Medicine

## 2014-09-12 DIAGNOSIS — H43391 Other vitreous opacities, right eye: Secondary | ICD-10-CM | POA: Diagnosis not present

## 2014-09-12 DIAGNOSIS — H43811 Vitreous degeneration, right eye: Secondary | ICD-10-CM | POA: Diagnosis not present

## 2014-09-12 DIAGNOSIS — H04123 Dry eye syndrome of bilateral lacrimal glands: Secondary | ICD-10-CM | POA: Diagnosis not present

## 2014-09-12 DIAGNOSIS — D3132 Benign neoplasm of left choroid: Secondary | ICD-10-CM | POA: Diagnosis not present

## 2014-09-12 DIAGNOSIS — H25813 Combined forms of age-related cataract, bilateral: Secondary | ICD-10-CM | POA: Diagnosis not present

## 2014-10-03 ENCOUNTER — Ambulatory Visit: Payer: Self-pay | Admitting: Internal Medicine

## 2014-10-25 DIAGNOSIS — D3132 Benign neoplasm of left choroid: Secondary | ICD-10-CM | POA: Diagnosis not present

## 2014-10-25 DIAGNOSIS — H25813 Combined forms of age-related cataract, bilateral: Secondary | ICD-10-CM | POA: Diagnosis not present

## 2015-01-04 ENCOUNTER — Other Ambulatory Visit: Payer: Self-pay | Admitting: Internal Medicine

## 2015-03-06 ENCOUNTER — Other Ambulatory Visit: Payer: Self-pay

## 2015-03-06 DIAGNOSIS — Z1231 Encounter for screening mammogram for malignant neoplasm of breast: Secondary | ICD-10-CM

## 2015-04-01 ENCOUNTER — Other Ambulatory Visit: Payer: Self-pay | Admitting: Physician Assistant

## 2015-04-03 ENCOUNTER — Encounter: Payer: Self-pay | Admitting: Internal Medicine

## 2015-04-03 ENCOUNTER — Encounter: Payer: Self-pay | Admitting: Physician Assistant

## 2015-04-23 ENCOUNTER — Ambulatory Visit
Admission: RE | Admit: 2015-04-23 | Discharge: 2015-04-23 | Disposition: A | Discharge status: Home / Self Care | Payer: Medicare Other | Source: Ambulatory Visit

## 2015-04-23 ENCOUNTER — Ambulatory Visit (INDEPENDENT_AMBULATORY_CARE_PROVIDER_SITE_OTHER): Payer: Medicare Other | Admitting: Podiatry

## 2015-04-23 ENCOUNTER — Ambulatory Visit (INDEPENDENT_AMBULATORY_CARE_PROVIDER_SITE_OTHER): Payer: Medicare Other

## 2015-04-23 DIAGNOSIS — M722 Plantar fascial fibromatosis: Secondary | ICD-10-CM

## 2015-04-23 DIAGNOSIS — Z1231 Encounter for screening mammogram for malignant neoplasm of breast: Secondary | ICD-10-CM

## 2015-04-23 MED ORDER — METHYLPREDNISOLONE 4 MG PO TBPK: ORAL_TABLET | ORAL | Status: DC

## 2015-04-23 MED ORDER — MELOXICAM 15 MG PO TABS: 15.0000 mg | ORAL_TABLET | Freq: Every day | ORAL | Status: DC

## 2015-04-23 NOTE — Progress Notes (Signed)
   Subjective:    Patient ID: Lauren Mckenzie, female    DOB: Dec 02, 1950, 65 y.o.   MRN: [id number]  HPI: She presents today with chief complaint of pain to the plantar aspect of the left arch which radiates proximally up the posterior calf times the past several weeks to months. She's tried compression but to no avail. Mornings are particularly bad or anytime after she's been sitting and gets back up to walk. She denies trauma. He    Review of Systems  All other systems reviewed and are negative.      Objective:   Physical Exam: 65 year old female vital signs stable alert and oriented 3 in no apparent distress demonstrates strong palpable pulses bilateral. Neurologic sensorium is intact per Semmes-Weinstein monofilament. Deep tendon reflexes are intact bilateral and muscle strength +5 over 5 dorsiflexion plantar flexors and inverters everters all intrinsic musculature is intact. Orthopedic evaluation and a straight awl joints distal to the ankle, full range of motion without crepitation. She does have pain on palpation of the medial calcaneal tubercle of the left heel as well as pain on palpation of the Achilles as it inserts into the calcaneus as well as proximally into the calf. Negative Homan sign is present. Radiographs taken today demonstrate soft tissue increase in density at the plantar fascial calcaneal insertion site without fractures. Cutaneous evaluation does demonstrates supple well-hydrated cutis no erythema edema saline as drainage or odor. No open lesions or wounds.        Assessment & Plan:  Plantar fasciitis with compensatory Achilles tendinitis left foot.  Plan: We discussed the etiology pathology conservative versus surgical therapies. We discussed appropriate shoe gear therapy stretching and sizes ice therapy and shoe gear modifications. I injected her left heel today with plantar fascial calcaneal Kenalog injection. Placed her in a plantar fascial brace and a night  splint. Prescribed Medrol Dosepak to be followed by meloxicam. I will follow-up with her in 1 month.

## 2015-04-23 NOTE — Patient Instructions (Signed)

## 2015-04-24 ENCOUNTER — Ambulatory Visit: Payer: Self-pay

## 2015-04-30 ENCOUNTER — Telehealth: Payer: Self-pay | Admitting: *Deleted

## 2015-04-30 NOTE — Telephone Encounter (Addendum)
Pt states she saw Dr. Milinda Pointer last week and is taking the antiinflammatory medication, aspirin, Tylenol and wearing the boot, without relief.  Pt asked for stronger medication. I encouraged pt to ice at least 3-4 times a day 15 minutes each time, continue the boot, and Mobic, and I would call again 05/01/2015 if further instructions were given.  05/01/2015 - DR. HYATT ORDERED Tramadol 50 mg #30 one tablet every 8 hours prn foot pain.  Orders called to pt and to [facility].

## 2015-04-30 NOTE — Telephone Encounter (Signed)
You may try a short dose of tramadol.

## 2015-05-01 DIAGNOSIS — I10 Essential (primary) hypertension: Secondary | ICD-10-CM | POA: Diagnosis not present

## 2015-05-01 DIAGNOSIS — D3132 Benign neoplasm of left choroid: Secondary | ICD-10-CM | POA: Diagnosis not present

## 2015-05-01 DIAGNOSIS — H25813 Combined forms of age-related cataract, bilateral: Secondary | ICD-10-CM | POA: Diagnosis not present

## 2015-05-01 DIAGNOSIS — H2513 Age-related nuclear cataract, bilateral: Secondary | ICD-10-CM | POA: Diagnosis not present

## 2015-05-01 DIAGNOSIS — J45909 Unspecified asthma, uncomplicated: Secondary | ICD-10-CM | POA: Diagnosis not present

## 2015-05-01 MED ORDER — TRAMADOL HCL 50 MG PO TABS: 50.0000 mg | ORAL_TABLET | Freq: Three times a day (TID) | ORAL | Status: DC | PRN

## 2015-05-14 ENCOUNTER — Other Ambulatory Visit: Payer: Self-pay | Admitting: Internal Medicine

## 2015-05-23 ENCOUNTER — Ambulatory Visit: Payer: Medicare Other | Admitting: Podiatry

## 2015-06-01 ENCOUNTER — Other Ambulatory Visit: Payer: Self-pay | Admitting: Podiatry

## 2015-06-03 NOTE — Telephone Encounter (Signed)
Pt must make an appt prior to future refills. 

## 2015-06-05 ENCOUNTER — Encounter: Payer: Self-pay | Admitting: Physician Assistant

## 2015-06-05 ENCOUNTER — Other Ambulatory Visit: Payer: Self-pay

## 2015-06-05 ENCOUNTER — Ambulatory Visit (INDEPENDENT_AMBULATORY_CARE_PROVIDER_SITE_OTHER): Payer: Medicare Other | Admitting: Physician Assistant

## 2015-06-05 VITALS — BP 122/90 | HR 77 | Temp 97.3°F | Resp 16 | Ht 63.5 in | Wt 228.6 lb

## 2015-06-05 DIAGNOSIS — E039 Hypothyroidism, unspecified: Secondary | ICD-10-CM | POA: Diagnosis not present

## 2015-06-05 DIAGNOSIS — I10 Essential (primary) hypertension: Secondary | ICD-10-CM | POA: Diagnosis not present

## 2015-06-05 DIAGNOSIS — Z79899 Other long term (current) drug therapy: Secondary | ICD-10-CM

## 2015-06-05 DIAGNOSIS — R7303 Prediabetes: Secondary | ICD-10-CM

## 2015-06-05 DIAGNOSIS — Z0001 Encounter for general adult medical examination with abnormal findings: Secondary | ICD-10-CM | POA: Diagnosis not present

## 2015-06-05 DIAGNOSIS — Z23 Encounter for immunization: Secondary | ICD-10-CM | POA: Diagnosis not present

## 2015-06-05 DIAGNOSIS — R6889 Other general symptoms and signs: Secondary | ICD-10-CM

## 2015-06-05 DIAGNOSIS — F329 Major depressive disorder, single episode, unspecified: Secondary | ICD-10-CM

## 2015-06-05 DIAGNOSIS — E785 Hyperlipidemia, unspecified: Secondary | ICD-10-CM

## 2015-06-05 DIAGNOSIS — E559 Vitamin D deficiency, unspecified: Secondary | ICD-10-CM

## 2015-06-05 DIAGNOSIS — D649 Anemia, unspecified: Secondary | ICD-10-CM

## 2015-06-05 DIAGNOSIS — R0609 Other forms of dyspnea: Secondary | ICD-10-CM

## 2015-06-05 DIAGNOSIS — F419 Anxiety disorder, unspecified: Secondary | ICD-10-CM

## 2015-06-05 DIAGNOSIS — J45909 Unspecified asthma, uncomplicated: Secondary | ICD-10-CM | POA: Diagnosis not present

## 2015-06-05 DIAGNOSIS — R350 Frequency of micturition: Secondary | ICD-10-CM

## 2015-06-05 DIAGNOSIS — R7309 Other abnormal glucose: Secondary | ICD-10-CM | POA: Diagnosis not present

## 2015-06-05 DIAGNOSIS — Z0000 Encounter for general adult medical examination without abnormal findings: Secondary | ICD-10-CM

## 2015-06-05 DIAGNOSIS — F32A Depression, unspecified: Secondary | ICD-10-CM

## 2015-06-05 MED ORDER — ALBUTEROL SULFATE HFA 108 (90 BASE) MCG/ACT IN AERS: INHALATION_SPRAY | RESPIRATORY_TRACT | Status: DC

## 2015-06-05 NOTE — Progress Notes (Signed)
MEDICARE ANNUAL WELLNESS VISIT AND FOLLOW UP  Assessment:   1. Essential hypertension - CBC with Differential/Platelet - BASIC METABOLIC PANEL WITH GFR - Hepatic function panel - Urinalysis, Routine w reflex microscopic (not at [facility])  2. Hypothyroidism, unspecified hypothyroidism type - TSH  3. Prediabetes - Hemoglobin A1c  4. Hyperlipidemia - Lipid panel  5. Morbid obesity, unspecified obesity type ([place]) Obesity with co morbidities- long discussion about weight loss, diet, and exercise  6. Vitamin D deficiency - VITAMIN D 25 Hydroxy (Vit-D Deficiency, Fractures)  7. Medication management - Magnesium  8. Dyspnea on exertion Albuterol   9. Depression Continue follow up Dr. Toy Care  10. Anxiety Continue follow up [employer]  11. Anemia, unspecified anemia type - Ferritin - Iron and TIBC - Vitamin B12  12. Asthma, chronic, unspecified asthma severity, uncomplicated - albuterol (PROAIR HFA) 108 (90 Base) MCG/ACT inhaler; INHALE TWO PUFFS BY MOUTH 4 TIMES DAILY TO EVERY 4 HOURS  Dispense: 1 each; Refill: 99  13. Urinary frequency - Urinalysis, Routine w reflex microscopic (not at [facility]) - Urine culture  14. Need for prophylactic vaccination against Streptococcus pneumoniae (pneumococcus) - Pneumococcal conjugate vaccine 13-valent IM   Plan:   During the course of the visit the patient was educated and counseled about appropriate screening and preventive services including:    Pneumococcal vaccine   Influenza vaccine  Td vaccine  Screening electrocardiogram  Screening mammography  Bone densitometry screening  Colorectal cancer screening  Diabetes screening  Glaucoma screening  Nutrition counseling   Advanced directives: given info/requested  Conditions/risks identified: BMI: Discussed weight loss, diet, and increase physical activity.  Increase physical activity: AHA recommends 150 minutes of physical activity a week.  Medications reviewed DEXA-  requested Diabetes is at goal, ACE/ARB therapy: Yes. Urinary Incontinence is not an issue: discussed non pharmacology and pharmacology options.  Fall risk: low- discussed PT, home fall assessment, medications.    Subjective:   Lauren Mckenzie is a 65 y.o. female who presents for Medicare Annual Wellness Visit and 3 month follow up on hypertension, prediabetes, hyperlipidemia, vitamin D def.  Date of last medicare wellness visit was 09/2013  Her blood pressure has been controlled at home, today their BP is BP: 122/90 mmHg She does workout. She denies chest pain, shortness of breath, dizziness.  She is not on cholesterol medication and denies myalgias. Her cholesterol is at goal. The cholesterol last visit was:   Lab Results  Component Value Date   CHOL 191 03/19/2014   HDL 47 03/19/2014   LDLCALC 103* 03/19/2014   TRIG 204* 03/19/2014   CHOLHDL 4.1 03/19/2014   She has been working on diet and exercise for prediabetes, and denies paresthesia of the feet, polydipsia and polyuria. Last A1C in the office was:  Lab Results  Component Value Date   HGBA1C 5.5 03/19/2014   Patient is on Vitamin D supplement. Lab Results  Component Value Date   VD25OH 54 03/19/2014  On disability due to depression since 2005, follows with Dr. Toy Care. Recently seeing Dr. 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 But she is going to go on the 31st  She is on Gabapentin for RLS and states it is helping, she is taking 1-2 at night.  She has felt dizzy, has almost passed out 2-3 times a week, worse from squatting to standing. Normal echo 2012 She is on thyroid medication. Her medication was  not changed last visit. Patient denies nervousness, palpitations and weight changes.  Lab Results  Component Value Date   TSH 2.066 03/19/2014   BMI is Body mass index is 39.85 kg/(m^2)., she is working on diet and exercise.She had a  negative sleep study.  Wt Readings from Last 3 Encounters:  06/05/15 228 lb 9.6 oz (103.692 kg)  03/19/14 221 lb (100.245 kg)  10/18/13 205 lb (92.987 kg)     Names of Other Physician/Practitioners you currently use: 1. [facility]- here for primary care 2. Dr. Katy Fitch eye doctor, last visit recent 3. Dr. Cordelia Pen at [facility]- eye specialist- ? Ocular melanolma being watched 3. None dentist, last visit unknown Patient Care Team: Unk Pinto, MD as PCP - General (Internal Medicine) Carol Ada, MD as Consulting Physician (Gastroenterology) Larey Dresser, MD as Consulting Physician (Cardiology) Gentry Fitz, MD as Consulting Physician (Family Medicine) Chucky May, MD as Consulting Physician (Psychiatry) Deneise Lever, MD as Consulting Physician (Pulmonary Disease)  Medication Review Current Outpatient Prescriptions on File Prior to Visit  Medication Sig Dispense Refill  . albuterol (PROVENTIL,VENTOLIN) 90 MCG/ACT inhaler Inhale 2 puffs into the lungs every 6 (six) hours as needed for wheezing or shortness of breath.     . ALPRAZolam (XANAX) 1 MG tablet Take 1 mg by mouth 4 (four) times daily.     Marland Kitchen aspirin 81 MG EC tablet Take 81 mg by mouth daily.      . calcium carbonate (OS-CAL) 600 MG TABS Take 600 mg by mouth daily.      . Cholecalciferol (VITAMIN D) 2000 UNITS CAPS Take 2 capsules by mouth daily.     Marland Kitchen CINNAMON PO Take 1,000 mg by mouth 3 (three) times daily.    . Cyanocobalamin (VITAMIN B-12 PO) Take 1 tablet by mouth daily.    Marland Kitchen EPINEPHrine (EPI-PEN) 0.3 mg/0.3 mL SOAJ injection Inject 0.3 mLs (0.3 mg total) into the muscle once. 2 Device 1  . ferrous sulfate 325 (65 FE) MG tablet Take 325 mg by mouth daily with breakfast.     . furosemide (LASIX) 40 MG tablet TAKE 1 TABLET BY MOUTH TWICE A DAY 60 tablet 3  . gabapentin (NEURONTIN) 300 MG capsule TAKE 1 CAPSULE (300 MG TOTAL) BY MOUTH 4 (FOUR) TIMES DAILY. 120 capsule 3   . ipratropium-albuterol (DUONEB) 0.5-2.5 (3) MG/3ML SOLN USE 1 VIAL IN NEBULIZER 4 TIMES A DAY 180 mL 1  . levothyroxine (SYNTHROID, LEVOTHROID) 50 MCG tablet TAKE 1 TABLET BY MOUTH ONCE DAILY 90 tablet 3  . MAGNESIUM PO Take 1 tablet by mouth 2 (two) times daily.    . meloxicam (MOBIC) 15 MG tablet Take 1 tablet (15 mg total) by mouth daily. 30 tablet 3  . methylPREDNISolone (MEDROL) 4 MG TBPK tablet Tapering 6 day dose pack 21 tablet 0  . PROAIR HFA 108 (90 BASE) MCG/ACT inhaler INHALE TWO PUFFS BY MOUTH 4 TIMES DAILY TO EVERY 4 HOURS 9 each 99  . traMADol (ULTRAM) 50 MG tablet TAKE 1 TABLET EVERY 8 HOURS AS NEEDED FOR PAIN 30 tablet 0  . venlafaxine (EFFEXOR) 100 MG tablet Take 100 mg by mouth 3 (three) times daily.     No current facility-administered medications on file prior to visit.    Current Problems (verified) Patient Active Problem List   Diagnosis Date Noted  . Vitamin D deficiency 03/19/2014  . Medication management 03/19/2014  . Morbid obesity (BMI 38.53) 03/19/2014  . Hypertension   .  Depression   . Anemia   . Anxiety   . Hypothyroid   . Prediabetes   . Dyspnea on exertion 07/07/2010  . Hyperlipidemia 07/07/2010  . Asthma, chronic 03/17/2007    Screening Tests Health Maintenance  Topic Date Due  . Hepatitis C Screening  September 01, 1950  . HIV Screening  08/23/1964  . TETANUS/TDAP  08/23/1968  . PAP SMEAR  08/24/1970  . ZOSTAVAX  08/23/2009  . PNA vac Low Risk Adult (1 of 2 - PCV13) 08/24/2014  . INFLUENZA VACCINE  10/29/2014  . MAMMOGRAM  04/22/2017  . COLONOSCOPY  02/02/2021  . DEXA SCAN  Completed     Immunization History  Administered Date(s) Administered  . DT 10/18/2013  . Influenza Split 01/04/2013  . Pneumococcal Polysaccharide-23 12/23/2011    Preventative care: Last colonoscopy: 2012 due 10 years.  Last mammogram: 03/2015  Last pap smear/pelvic exam: remote DEXA:12/2011 due 2015 Oct Echo/stress test 06/2010  Prior vaccinations TD or  Tdap: 2015  Influenza: 2014 not due Pneumococcal: 2013 Prevnar 13: due will get today Shingles/Zostavax: declines  Allergies Allergies  Allergen Reactions  . Shellfish Allergy Anaphylaxis  . Theophylline Anaphylaxis   Surgical history Past Surgical History  Procedure Laterality Date  . Gastric bypass    . Cholecystectomy    . Tubal ligation    . Cervical fusion      C6-7   Family history Family History  Problem Relation Age of Onset  . Heart attack Mother   . Heart attack Father    Risk Factors: Osteoporosis: postmenopausal estrogen deficiency and dietary calcium and/or vitamin D deficiency History of fracture in the past year: yes  Tobacco Social History  Substance Use Topics  . Smoking status: Never Smoker   . Smokeless tobacco: Never Used  . Alcohol Use: No   She does not smoke.  Patient is not a former smoker. Are there smokers in your home (other than you)?  No  Alcohol Current alcohol use: none  Caffeine Current caffeine use: coffee 2 /day  Exercise Current exercise: none  Nutrition/Diet Current diet: in general, a "healthy" diet    Cardiac risk factors: dyslipidemia, hypertension, obesity (BMI >= 30 kg/m2) and sedentary lifestyle.  Depression Screen (Note: if answer to either of the following is "Yes", a more complete depression screening is indicated)   Q1: Over the past two weeks, have you felt down, depressed or hopeless? Yes  Q2: Over the past two weeks, have you felt little interest or pleasure in doing things? Yes  Have you lost interest or pleasure in daily life? Yes  Do you often feel hopeless? Yes  Do you cry easily over simple problems? Yes  Activities of Daily Living In your present state of health, do you have any difficulty performing the following activities?:  Driving? No Managing money?  No Feeding yourself? No Getting from bed to chair? No Climbing a flight of stairs? No Preparing food and eating?: No Bathing or  showering? No Getting dressed: No Getting to the toilet? No Using the toilet:No Moving around from place to place: No In the past year have you fallen or had a near fall?: 6, no injuries  Vision Difficulties: Yes  Hearing Difficulties: No Do you often ask people to speak up or repeat themselves? No Do you experience ringing or noises in your ears? No Do you have difficulty understanding soft or whispered voices? No  Cognition  Do you feel that you have a problem with memory?No  Do you often  misplace items? No  Do you feel safe at home?  Yes  Advanced directives Does patient have a [place]? Yes Does patient have a Living Will? Yes   Objective:   Blood pressure 122/90, pulse 77, temperature 97.3 F (36.3 C), temperature source Temporal, resp. rate 16, height 5' 3.5" (1.613 m), weight 228 lb 9.6 oz (103.692 kg), SpO2 97 %. Body mass index is 39.85 kg/(m^2).  General appearance: alert, no distress, WD/WN,  female Cognitive Testing  Alert? Yes  Normal Appearance?Yes  Oriented to person? Yes  Place? Yes   Time? Yes  Recall of three objects?  Yes  Can perform simple calculations? Yes  Displays appropriate judgment?Yes  Can read the correct time from a watch face?Yes  HEENT: normocephalic, sclerae anicteric, TMs pearly, nares patent, no discharge or erythema, pharynx normal Oral cavity: MMM, no lesions Neck: supple, no lymphadenopathy, no thyromegaly, no masses Heart: RRR, normal S1, S2, no murmurs Lungs: CTA bilaterally, no wheezes, rhonchi, or rales Abdomen: +bs, soft, non tender, non distended, no masses, no hepatomegaly, no splenomegaly Musculoskeletal: nontender, no swelling, no obvious deformity Extremities: no edema, no cyanosis, no clubbing Pulses: 2+ symmetric, upper and lower extremities, normal cap refill Neurological: alert, oriented x 3, CN2-12 intact, strength normal upper extremities and lower extremities, sensation normal throughout,  DTRs 2+ throughout, no cerebellar signs, gait normal Psychiatric: normal affect, behavior normal, pleasant  Breast: defer Gyn: defer Rectal: defer  Medicare Attestation I have personally reviewed: The patient's medical and social history Their use of alcohol, tobacco or illicit drugs Their current medications and supplements The patient's functional ability including ADLs,fall risks, home safety risks, cognitive, and hearing and visual impairment Diet and physical activities Evidence for depression or mood disorders  The patient's weight, height, BMI, and visual acuity have been recorded in the chart.  I have made referrals, counseling, and provided education to the patient based on review of the above and I have provided the patient with a written personalized care plan for preventive services.     Vicie Mutters, PA-C   06/05/2015

## 2015-06-05 NOTE — Patient Instructions (Addendum)
Dr. Kathrin Penner and Dr. Delman Cheadle  Monitor your blood pressure Only take 1/2 of the lasix, not a whole lasix, and increase water.   Dizziness Dizziness is a common problem. It is a feeling of unsteadiness or light-headedness. You may feel like you are about to faint. Dizziness can lead to injury if you stumble or fall. Anyone can become dizzy, but dizziness is more common in older adults. This condition can be caused by a number of things, including medicines, dehydration, or illness. HOME CARE INSTRUCTIONS Taking these steps may help with your condition: Eating and Drinking  Drink enough fluid to keep your urine clear or pale yellow. This helps to keep you from becoming dehydrated. Try to drink more clear fluids, such as water.  Do not drink alcohol.  Limit your caffeine intake if directed by your health care provider.  Limit your salt intake if directed by your health care provider. Activity  Avoid making quick movements.  Rise slowly from chairs and steady yourself until you feel okay.  In the morning, first sit up on the side of the bed. When you feel okay, stand slowly while you hold onto something until you know that your balance is fine.  Move your legs often if you need to stand in one place for a long time. Tighten and relax your muscles in your legs while you are standing.  Do not drive or operate heavy machinery if you feel dizzy.  Avoid bending down if you feel dizzy. Place items in your home so that they are easy for you to reach without leaning over. Lifestyle  Do not use any tobacco products, including cigarettes, chewing tobacco, or electronic cigarettes. If you need help quitting, ask your health care provider.  Try to reduce your stress level, such as with yoga or meditation. Talk with your health care provider if you need help. General Instructions  Watch your dizziness for any changes.  Take medicines only as directed by your health care provider. Talk with  your health care provider if you think that your dizziness is caused by a medicine that you are taking.  Tell a friend or a family member that you are feeling dizzy. If he or she notices any changes in your behavior, have this person call your health care provider.  Keep all follow-up visits as directed by your health care provider. This is important. SEEK MEDICAL CARE IF:  Your dizziness does not go away.  Your dizziness or light-headedness gets worse.  You feel nauseous.  You have reduced hearing.  You have new symptoms.  You are unsteady on your feet or you feel like the room is spinning. SEEK IMMEDIATE MEDICAL CARE IF:  You vomit or have diarrhea and are unable to eat or drink anything.  You have problems talking, walking, swallowing, or using your arms, hands, or legs.  You feel generally weak.  You are not thinking clearly or you have trouble forming sentences. It may take a friend or family member to notice this.  You have chest pain, abdominal pain, shortness of breath, or sweating.  Your vision changes.  You notice any bleeding.  You have a headache.  You have neck pain or a stiff neck.  You have a fever.   This information is not intended to replace advice given to you by your health care provider. Make sure you discuss any questions you have with your health care provider.   Document Released: 09/09/2000 Document Revised: 07/31/2014 Document Reviewed: 03/12/2014 Elsevier  Interactive Patient Education 2016 [employer] Inc.  

## 2015-06-06 ENCOUNTER — Encounter: Payer: Self-pay | Admitting: Podiatry

## 2015-06-06 ENCOUNTER — Ambulatory Visit (INDEPENDENT_AMBULATORY_CARE_PROVIDER_SITE_OTHER): Payer: Medicare Other | Admitting: Podiatry

## 2015-06-06 VITALS — BP 141/86 | HR 69 | Resp 12

## 2015-06-06 DIAGNOSIS — L859 Epidermal thickening, unspecified: Secondary | ICD-10-CM | POA: Diagnosis not present

## 2015-06-06 DIAGNOSIS — M722 Plantar fascial fibromatosis: Secondary | ICD-10-CM

## 2015-06-06 DIAGNOSIS — Z23 Encounter for immunization: Secondary | ICD-10-CM | POA: Diagnosis not present

## 2015-06-06 DIAGNOSIS — B070 Plantar wart: Secondary | ICD-10-CM | POA: Diagnosis not present

## 2015-06-06 LAB — CBC WITH DIFFERENTIAL/PLATELET
BASOS PCT: 0 % (ref 0–1)
Basophils Absolute: 0 10*3/uL (ref 0.0–0.1)
EOS ABS: 0.3 10*3/uL (ref 0.0–0.7)
Eosinophils Relative: 4 % (ref 0–5)
HCT: 38.7 % (ref 36.0–46.0)
HEMOGLOBIN: 12.4 g/dL (ref 12.0–15.0)
Lymphocytes Relative: 25 % (ref 12–46)
Lymphs Abs: 1.6 10*3/uL (ref 0.7–4.0)
MCH: 28 pg (ref 26.0–34.0)
MCHC: 32 g/dL (ref 30.0–36.0)
MCV: 87.4 fL (ref 78.0–100.0)
MPV: 9.2 fL (ref 8.6–12.4)
Monocytes Absolute: 0.4 10*3/uL (ref 0.1–1.0)
Monocytes Relative: 7 % (ref 3–12)
NEUTROS ABS: 4 10*3/uL (ref 1.7–7.7)
NEUTROS PCT: 64 % (ref 43–77)
PLATELETS: 222 10*3/uL (ref 150–400)
RBC: 4.43 MIL/uL (ref 3.87–5.11)
RDW: 14 % (ref 11.5–15.5)
WBC: 6.3 10*3/uL (ref 4.0–10.5)

## 2015-06-06 LAB — HEPATIC FUNCTION PANEL
ALT: 9 U/L (ref 6–29)
AST: 11 U/L (ref 10–35)
Albumin: 3.8 g/dL (ref 3.6–5.1)
Alkaline Phosphatase: 94 U/L (ref 33–130)
BILIRUBIN DIRECT: 0.1 mg/dL (ref <=0.2)
BILIRUBIN INDIRECT: 0.2 mg/dL (ref 0.2–1.2)
BILIRUBIN TOTAL: 0.3 mg/dL (ref 0.2–1.2)
Total Protein: 6.1 g/dL (ref 6.1–8.1)

## 2015-06-06 LAB — LIPID PANEL
CHOL/HDL RATIO: 3.7 ratio (ref <=5.0)
CHOLESTEROL: 182 mg/dL (ref 125–200)
HDL: 49 mg/dL (ref >=46)
LDL Cholesterol: 102 mg/dL (ref <130)
Triglycerides: 157 mg/dL — ABNORMAL HIGH (ref <150)
VLDL: 31 mg/dL — AB (ref <30)

## 2015-06-06 LAB — BASIC METABOLIC PANEL WITH GFR
BUN: 13 mg/dL (ref 7–25)
CALCIUM: 9.3 mg/dL (ref 8.6–10.4)
CHLORIDE: 104 mmol/L (ref 98–110)
CO2: 31 mmol/L (ref 20–31)
CREATININE: 1.53 mg/dL — AB (ref 0.50–0.99)
GFR, EST NON AFRICAN AMERICAN: 35 mL/min — AB (ref >=60)
GFR, Est African American: 41 mL/min — ABNORMAL LOW (ref >=60)
Glucose, Bld: 81 mg/dL (ref 65–99)
Potassium: 4.8 mmol/L (ref 3.5–5.3)
SODIUM: 139 mmol/L (ref 135–146)

## 2015-06-06 LAB — URINALYSIS, ROUTINE W REFLEX MICROSCOPIC
Bilirubin Urine: NEGATIVE
Glucose, UA: NEGATIVE
Hgb urine dipstick: NEGATIVE
Ketones, ur: NEGATIVE
Leukocytes, UA: NEGATIVE
NITRITE: NEGATIVE
PH: 5.5 (ref 5.0–8.0)
Protein, ur: NEGATIVE
SPECIFIC GRAVITY, URINE: 1.012 (ref 1.001–1.035)

## 2015-06-06 LAB — IRON AND TIBC
%SAT: 22 % (ref 11–50)
IRON: 65 ug/dL (ref 45–160)
TIBC: 294 ug/dL (ref 250–450)
UIBC: 229 ug/dL (ref 125–400)

## 2015-06-06 LAB — VITAMIN B12

## 2015-06-06 LAB — HEMOGLOBIN A1C
Hgb A1c MFr Bld: 5.3 % (ref <5.7)
MEAN PLASMA GLUCOSE: 105 mg/dL (ref <117)

## 2015-06-06 LAB — VITAMIN D 25 HYDROXY (VIT D DEFICIENCY, FRACTURES): Vit D, 25-Hydroxy: 63 ng/mL (ref 30–100)

## 2015-06-06 LAB — FERRITIN: FERRITIN: 51 ng/mL (ref 20–288)

## 2015-06-06 LAB — MAGNESIUM: Magnesium: 2.2 mg/dL (ref 1.5–2.5)

## 2015-06-06 LAB — TSH: TSH: 2.09 m[IU]/L

## 2015-06-06 MED ORDER — DICLOFENAC SODIUM 75 MG PO TBEC: 75.0000 mg | DELAYED_RELEASE_TABLET | Freq: Two times a day (BID) | ORAL | Status: DC

## 2015-06-06 MED ORDER — TRAMADOL HCL 50 MG PO TABS: 50.0000 mg | ORAL_TABLET | Freq: Three times a day (TID) | ORAL | Status: DC | PRN

## 2015-06-07 LAB — URINE CULTURE: Colony Count: 15000

## 2015-06-09 NOTE — Progress Notes (Signed)
She presents today for follow-up of plantar fasciitis of her left heel states it is still a little bit sore. She also has a painful callus to the forefoot right.  Objective: Vital signs are stable alert and oriented 3. Pulses are palpable. Neurologic sensorium is intact. No calf pain either leg. She has pain on palpation medially located chewable of the left heel. Chronic intractable plantar fasciitis year. She also has a small less than 3 mm lesion sub-first metatarsophalangeal joint at the sulcus of the great toe. This does appear to be verrucoid in nature.  Assessment: Verruca plantaris right foot. Plantar fasciitis left foot.  Plan: Excision of a wart plantar aspect of the right foot after local anesthesia was administered. We sent for pathologic evaluation. I also injected her left foot. With Kenalog and local anesthetic. Started her on soaking for her toe provided her with both oral and written going home going instructions for the wart.

## 2015-06-11 ENCOUNTER — Ambulatory Visit: Payer: Medicare Other | Admitting: Podiatry

## 2015-06-14 ENCOUNTER — Telehealth: Payer: Self-pay | Admitting: *Deleted

## 2015-06-14 ENCOUNTER — Other Ambulatory Visit: Payer: Self-pay | Admitting: Internal Medicine

## 2015-06-14 MED ORDER — TRAMADOL HCL 50 MG PO TABS: 50.0000 mg | ORAL_TABLET | Freq: Three times a day (TID) | ORAL | Status: DC | PRN

## 2015-06-14 NOTE — Telephone Encounter (Signed)
Pt states she is having a lot of pain from the plantar wart site, denies swelling, redness, streaking or cloudy drainage.  Pt states the Tramadol helps a little but not for long.  I told pt to take the Diclofenac and it would help with the pain in both feet and take the Tramadol as directed not as needed and that would help also. Offered an appt, and pt states can't do today, and I told pt I could show her how to off load the surgery site for more comfort.  Pt states her fiancee dressed her foot and can come by since he has the car.  I told her it would be better to show her on her foot, but if necessary I would show Josie Saunders.  Pt states she will call him and continue the soaks, and rest more and will be at the 06/18/2015 appt.  Tramadol called to [facility].

## 2015-06-18 ENCOUNTER — Encounter: Payer: Self-pay | Admitting: Podiatry

## 2015-06-18 ENCOUNTER — Ambulatory Visit (INDEPENDENT_AMBULATORY_CARE_PROVIDER_SITE_OTHER): Payer: Medicare Other | Admitting: Podiatry

## 2015-06-18 DIAGNOSIS — B070 Plantar wart: Secondary | ICD-10-CM

## 2015-06-18 NOTE — Progress Notes (Signed)
She presents today for follow-up of a curettage at the base of the hallux right foot. And for plantar fasciitis of the left foot. She states that the right foot is very painful however the plantar fasciitis is resolving nicely. She states that she's been soaking the right foot as recommended.  Objective: Vital signs are stable alert and oriented 3. Pathology report came today and also demonstrates verruca. The curettage site appears to be healing very nicely there is no erythema no cellulitis drainage or odor no signs of infection. Moderately tender on palpation. She has no pain on palpation medial calcaneal tubercle of the left heel.  Assessment: Well-healing surgical foot right that is still painful. Resolving plantar fasciitis left foot.  Plan: Discontinue Betadine start with Epsom salts and warm water soaks covered during the daytime and leave open at bedtime. Continue conservative therapies for her left foot. Follow-up with me should she continue to be painful.

## 2015-06-19 ENCOUNTER — Telehealth: Payer: Self-pay | Admitting: *Deleted

## 2015-06-19 NOTE — Telephone Encounter (Signed)
Entered in error

## 2015-07-02 ENCOUNTER — Encounter: Payer: Self-pay | Admitting: Podiatry

## 2015-07-05 ENCOUNTER — Emergency Department (HOSPITAL_COMMUNITY): Payer: Medicare Other

## 2015-07-05 ENCOUNTER — Observation Stay (HOSPITAL_COMMUNITY): Payer: Medicare Other

## 2015-07-05 ENCOUNTER — Encounter (HOSPITAL_COMMUNITY): Payer: Self-pay | Admitting: Emergency Medicine

## 2015-07-05 ENCOUNTER — Observation Stay (HOSPITAL_COMMUNITY)
Admission: EM | Admit: 2015-07-05 | Discharge: 2015-07-09 | Disposition: A | Discharge status: Home / Self Care | Payer: Medicare Other | Attending: Internal Medicine | Admitting: Internal Medicine

## 2015-07-05 DIAGNOSIS — R109 Unspecified abdominal pain: Secondary | ICD-10-CM | POA: Diagnosis present

## 2015-07-05 DIAGNOSIS — R7989 Other specified abnormal findings of blood chemistry: Secondary | ICD-10-CM | POA: Insufficient documentation

## 2015-07-05 DIAGNOSIS — N183 Chronic kidney disease, stage 3 (moderate): Secondary | ICD-10-CM | POA: Diagnosis not present

## 2015-07-05 DIAGNOSIS — E785 Hyperlipidemia, unspecified: Secondary | ICD-10-CM | POA: Diagnosis not present

## 2015-07-05 DIAGNOSIS — I129 Hypertensive chronic kidney disease with stage 1 through stage 4 chronic kidney disease, or unspecified chronic kidney disease: Secondary | ICD-10-CM | POA: Insufficient documentation

## 2015-07-05 DIAGNOSIS — F418 Other specified anxiety disorders: Secondary | ICD-10-CM | POA: Diagnosis not present

## 2015-07-05 DIAGNOSIS — K838 Other specified diseases of biliary tract: Secondary | ICD-10-CM

## 2015-07-05 DIAGNOSIS — J45909 Unspecified asthma, uncomplicated: Secondary | ICD-10-CM | POA: Diagnosis present

## 2015-07-05 DIAGNOSIS — R062 Wheezing: Secondary | ICD-10-CM

## 2015-07-05 DIAGNOSIS — F32A Depression, unspecified: Secondary | ICD-10-CM | POA: Diagnosis present

## 2015-07-05 DIAGNOSIS — R1010 Upper abdominal pain, unspecified: Secondary | ICD-10-CM

## 2015-07-05 DIAGNOSIS — R1011 Right upper quadrant pain: Principal | ICD-10-CM | POA: Insufficient documentation

## 2015-07-05 DIAGNOSIS — Z9884 Bariatric surgery status: Secondary | ICD-10-CM | POA: Diagnosis not present

## 2015-07-05 DIAGNOSIS — Z9049 Acquired absence of other specified parts of digestive tract: Secondary | ICD-10-CM | POA: Diagnosis not present

## 2015-07-05 DIAGNOSIS — F329 Major depressive disorder, single episode, unspecified: Secondary | ICD-10-CM | POA: Diagnosis not present

## 2015-07-05 DIAGNOSIS — Z79899 Other long term (current) drug therapy: Secondary | ICD-10-CM | POA: Insufficient documentation

## 2015-07-05 DIAGNOSIS — Z7982 Long term (current) use of aspirin: Secondary | ICD-10-CM | POA: Insufficient documentation

## 2015-07-05 DIAGNOSIS — E039 Hypothyroidism, unspecified: Secondary | ICD-10-CM | POA: Diagnosis not present

## 2015-07-05 DIAGNOSIS — F419 Anxiety disorder, unspecified: Secondary | ICD-10-CM | POA: Diagnosis present

## 2015-07-05 DIAGNOSIS — R110 Nausea: Secondary | ICD-10-CM | POA: Insufficient documentation

## 2015-07-05 DIAGNOSIS — I10 Essential (primary) hypertension: Secondary | ICD-10-CM | POA: Diagnosis present

## 2015-07-05 DIAGNOSIS — R935 Abnormal findings on diagnostic imaging of other abdominal regions, including retroperitoneum: Secondary | ICD-10-CM | POA: Diagnosis not present

## 2015-07-05 LAB — URINALYSIS, ROUTINE W REFLEX MICROSCOPIC
Bilirubin Urine: NEGATIVE
GLUCOSE, UA: NEGATIVE mg/dL
HGB URINE DIPSTICK: NEGATIVE
Ketones, ur: NEGATIVE mg/dL
Leukocytes, UA: NEGATIVE
Nitrite: NEGATIVE
PH: 6 (ref 5.0–8.0)
Protein, ur: NEGATIVE mg/dL
SPECIFIC GRAVITY, URINE: 1.03 (ref 1.005–1.030)

## 2015-07-05 LAB — CBC
HEMATOCRIT: 44.6 % (ref 36.0–46.0)
HEMOGLOBIN: 14.4 g/dL (ref 12.0–15.0)
MCH: 28.9 pg (ref 26.0–34.0)
MCHC: 32.3 g/dL (ref 30.0–36.0)
MCV: 89.6 fL (ref 78.0–100.0)
Platelets: 305 10*3/uL (ref 150–400)
RBC: 4.98 MIL/uL (ref 3.87–5.11)
RDW: 13.3 % (ref 11.5–15.5)
WBC: 7 10*3/uL (ref 4.0–10.5)

## 2015-07-05 LAB — COMPREHENSIVE METABOLIC PANEL
ALBUMIN: 4.3 g/dL (ref 3.5–5.0)
ALT: 59 U/L — ABNORMAL HIGH (ref 14–54)
ANION GAP: 8 (ref 5–15)
AST: 89 U/L — ABNORMAL HIGH (ref 15–41)
Alkaline Phosphatase: 146 U/L — ABNORMAL HIGH (ref 38–126)
BUN: 10 mg/dL (ref 6–20)
CHLORIDE: 101 mmol/L (ref 101–111)
CO2: 28 mmol/L (ref 22–32)
Calcium: 9.6 mg/dL (ref 8.9–10.3)
Creatinine, Ser: 1.56 mg/dL — ABNORMAL HIGH (ref 0.44–1.00)
GFR calc non Af Amer: 34 mL/min — ABNORMAL LOW (ref >60)
GFR, EST AFRICAN AMERICAN: 39 mL/min — AB (ref >60)
GLUCOSE: 101 mg/dL — AB (ref 65–99)
Potassium: 3.9 mmol/L (ref 3.5–5.1)
SODIUM: 137 mmol/L (ref 135–145)
Total Bilirubin: 0.5 mg/dL (ref 0.3–1.2)
Total Protein: 7.8 g/dL (ref 6.5–8.1)

## 2015-07-05 LAB — LIPASE, BLOOD: LIPASE: 22 U/L (ref 11–51)

## 2015-07-05 MED ORDER — IOPAMIDOL (ISOVUE-300) INJECTION 61%
100.0000 mL | Freq: Once | INTRAVENOUS | Status: AC | PRN
  Administered 2015-07-05: 80 mL via INTRAVENOUS

## 2015-07-05 MED ORDER — TRAZODONE HCL 50 MG PO TABS: 50.0000 mg | ORAL_TABLET | Freq: Every evening | ORAL | Status: DC | PRN

## 2015-07-05 MED ORDER — SODIUM CHLORIDE 0.9 % IV SOLN: 250.0000 mL | INTRAVENOUS | Status: DC | PRN

## 2015-07-05 MED ORDER — FERROUS SULFATE 325 (65 FE) MG PO TABS
325.0000 mg | ORAL_TABLET | Freq: Every day | ORAL | Status: DC
  Administered 2015-07-06 – 2015-07-09 (×4): 325 mg via ORAL
  Filled 2015-07-05 (×6): qty 1

## 2015-07-05 MED ORDER — QUETIAPINE FUMARATE 400 MG PO TABS
400.0000 mg | ORAL_TABLET | Freq: Every evening | ORAL | Status: DC | PRN
  Filled 2015-07-05: qty 1

## 2015-07-05 MED ORDER — HYDROMORPHONE HCL 1 MG/ML IJ SOLN
1.0000 mg | INTRAMUSCULAR | Status: AC | PRN
  Administered 2015-07-05: 1 mg via INTRAVENOUS
  Filled 2015-07-05: qty 1

## 2015-07-05 MED ORDER — CALCIUM CARBONATE 1250 (500 CA) MG PO TABS
1.0000 | ORAL_TABLET | Freq: Every day | ORAL | Status: DC
  Administered 2015-07-05 – 2015-07-09 (×5): 500 mg via ORAL
  Filled 2015-07-05 (×6): qty 1

## 2015-07-05 MED ORDER — GABAPENTIN 300 MG PO CAPS
300.0000 mg | ORAL_CAPSULE | Freq: Four times a day (QID) | ORAL | Status: DC | PRN
  Filled 2015-07-05: qty 1

## 2015-07-05 MED ORDER — ASPIRIN 81 MG PO CHEW
81.0000 mg | CHEWABLE_TABLET | Freq: Every day | ORAL | Status: DC
  Administered 2015-07-05 – 2015-07-09 (×5): 81 mg via ORAL
  Filled 2015-07-05 (×5): qty 1

## 2015-07-05 MED ORDER — LEVOTHYROXINE SODIUM 50 MCG PO TABS
50.0000 ug | ORAL_TABLET | Freq: Every day | ORAL | Status: DC
  Administered 2015-07-06 – 2015-07-09 (×4): 50 ug via ORAL
  Filled 2015-07-05 (×5): qty 1

## 2015-07-05 MED ORDER — IOHEXOL 300 MG/ML  SOLN
25.0000 mL | Freq: Once | INTRAMUSCULAR | Status: AC | PRN
  Administered 2015-07-05: 25 mL via ORAL

## 2015-07-05 MED ORDER — ALBUTEROL SULFATE (2.5 MG/3ML) 0.083% IN NEBU: 3.0000 mL | INHALATION_SOLUTION | RESPIRATORY_TRACT | Status: DC | PRN

## 2015-07-05 MED ORDER — SODIUM CHLORIDE 0.9% FLUSH
3.0000 mL | Freq: Two times a day (BID) | INTRAVENOUS | Status: DC
  Administered 2015-07-05 – 2015-07-09 (×4): 3 mL via INTRAVENOUS

## 2015-07-05 MED ORDER — TRAMADOL HCL 50 MG PO TABS
50.0000 mg | ORAL_TABLET | Freq: Four times a day (QID) | ORAL | Status: DC | PRN
  Administered 2015-07-05 – 2015-07-08 (×3): 50 mg via ORAL
  Filled 2015-07-05 (×3): qty 1

## 2015-07-05 MED ORDER — ONDANSETRON HCL 4 MG/2ML IJ SOLN
4.0000 mg | Freq: Once | INTRAMUSCULAR | Status: AC
Start: 2015-07-05 — End: 2015-07-05
  Administered 2015-07-05: 4 mg via INTRAVENOUS
  Filled 2015-07-05: qty 2

## 2015-07-05 MED ORDER — SODIUM CHLORIDE 0.9% FLUSH: 3.0000 mL | INTRAVENOUS | Status: DC | PRN

## 2015-07-05 MED ORDER — HYDROMORPHONE HCL 1 MG/ML IJ SOLN
0.5000 mg | Freq: Once | INTRAMUSCULAR | Status: AC
  Administered 2015-07-05: 0.5 mg via INTRAVENOUS
  Filled 2015-07-05: qty 1

## 2015-07-05 MED ORDER — VENLAFAXINE HCL 50 MG PO TABS
100.0000 mg | ORAL_TABLET | Freq: Three times a day (TID) | ORAL | Status: DC
  Administered 2015-07-05 – 2015-07-09 (×16): 100 mg via ORAL
  Filled 2015-07-05 (×19): qty 2

## 2015-07-05 MED ORDER — OXYCODONE-ACETAMINOPHEN 5-325 MG PO TABS
1.0000 | ORAL_TABLET | Freq: Once | ORAL | Status: AC
  Administered 2015-07-05: 1 via ORAL
  Filled 2015-07-05: qty 1

## 2015-07-05 MED ORDER — VITAMIN B-12 1000 MCG PO TABS
1000.0000 ug | ORAL_TABLET | Freq: Every day | ORAL | Status: DC
  Administered 2015-07-05 – 2015-07-09 (×5): 1000 ug via ORAL
  Filled 2015-07-05 (×5): qty 1

## 2015-07-05 MED ORDER — ONDANSETRON HCL 4 MG/2ML IJ SOLN
4.0000 mg | Freq: Three times a day (TID) | INTRAMUSCULAR | Status: DC | PRN
  Administered 2015-07-05: 4 mg via INTRAVENOUS
  Filled 2015-07-05: qty 2

## 2015-07-05 MED ORDER — LIP MEDEX EX OINT
TOPICAL_OINTMENT | CUTANEOUS | Status: AC
  Administered 2015-07-05: 1
  Filled 2015-07-05: qty 7

## 2015-07-05 MED ORDER — ALPRAZOLAM 1 MG PO TABS
1.0000 mg | ORAL_TABLET | Freq: Three times a day (TID) | ORAL | Status: DC | PRN
  Administered 2015-07-07: 1 mg via ORAL
  Filled 2015-07-05: qty 1

## 2015-07-05 MED ORDER — ENOXAPARIN SODIUM 40 MG/0.4ML ~~LOC~~ SOLN
40.0000 mg | SUBCUTANEOUS | Status: DC
  Administered 2015-07-05 – 2015-07-08 (×4): 40 mg via SUBCUTANEOUS
  Filled 2015-07-05 (×5): qty 0.4

## 2015-07-05 MED ORDER — SODIUM CHLORIDE 0.9 % IV SOLN
INTRAVENOUS | Status: DC
  Administered 2015-07-05 – 2015-07-08 (×5): via INTRAVENOUS

## 2015-07-05 MED ORDER — IPRATROPIUM-ALBUTEROL 0.5-2.5 (3) MG/3ML IN SOLN: 3.0000 mL | Freq: Four times a day (QID) | RESPIRATORY_TRACT | Status: DC | PRN

## 2015-07-05 MED ORDER — CALCIUM CARBONATE 1500 (600 CA) MG PO TABS
600.0000 mg | ORAL_TABLET | Freq: Every day | ORAL | Status: DC
  Filled 2015-07-05: qty 1

## 2015-07-05 MED ORDER — VITAMIN D3 25 MCG (1000 UNIT) PO TABS
10000.0000 [IU] | ORAL_TABLET | Freq: Every day | ORAL | Status: DC
  Administered 2015-07-06 – 2015-07-09 (×4): 10000 [IU] via ORAL
  Filled 2015-07-05 (×4): qty 10

## 2015-07-05 MED ORDER — VITAMIN D3 125 MCG (5000 UT) PO CAPS
10000.0000 [IU] | ORAL_CAPSULE | Freq: Every day | ORAL | Status: DC
  Filled 2015-07-05: qty 2

## 2015-07-05 MED ORDER — MORPHINE SULFATE (PF) 2 MG/ML IV SOLN
2.0000 mg | INTRAVENOUS | Status: DC | PRN
  Administered 2015-07-05: 2 mg via INTRAVENOUS
  Filled 2015-07-05: qty 1

## 2015-07-05 MED ORDER — IOHEXOL 300 MG/ML  SOLN: 50.0000 mL | Freq: Once | INTRAMUSCULAR | Status: DC | PRN

## 2015-07-05 NOTE — ED Notes (Signed)
Pt is aware we need a urine sample however she states she cant go at this time. Will notify when she is able to void

## 2015-07-05 NOTE — ED Notes (Signed)
Pt with hx of gastric bypass surgery c/o epigastric pain radiating around to RUQ and LUQ, nausea, dry heaving onset last Tuesday. Pt states she has not been able to vomit since gastric bypass.

## 2015-07-05 NOTE — H&P (Signed)
[facility] History and Physical  Lauren Mckenzie [id number] DOB: March 09, 1951 DOA: 07/05/2015  Referring physician: Mr. Carlisle Cater, PA EDP PCP: [name], MD  Specialists: Dr. [name], GI  Chief Complaint: Abdominal pain  HPI: Lauren Mckenzie is a 65 y.o. female  History of gastric bypass, anxiety and depression, hypertension, presented to the emergency department with complaints of abdominal pain. The pain is in ongoing for 3-4 days. Patient has seen Dr. [name], gastroenterologist in the past.  Patient has had some nausea however has been unable to vomit. Patient states she's been unable to vomit since her gastric bypass in 2003. Patient also complained of some bloating. Denies any diarrhea or constipation. Was able to have a bowel movement yesterday. Patient states she had her stomach on the counter this morning which caused her intermittent sharp pain. Patient has been tried taking Gas-X to relieve her symptoms. In the emergency department, CT abdomen and pelvis did show prominent dilation of the bile ducts, CBD measuring 11-12 mm in maximal diameter. Dr. [name], was called by the EDP, who recommended MRCP. [facility] called for admission.  Review of Systems:  Constitutional: Denies fever, chills, diaphoresis, appetite change and fatigue.  HEENT: Denies photophobia, eye pain, redness, hearing loss, ear pain, congestion, sore throat, rhinorrhea, sneezing, mouth sores, trouble swallowing, neck pain, neck stiffness and tinnitus.   Respiratory: Denies SOB, DOE, cough, chest tightness,  and wheezing.   Cardiovascular: Denies chest pain, palpitations and leg swelling.  Gastrointestinal: Complains of abdominal pain.  Genitourinary: Denies dysuria, urgency, frequency, hematuria, flank pain and difficulty urinating.  Musculoskeletal: Denies myalgias, back pain, joint swelling, arthralgias and gait problem.  Skin: Denies pallor, rash and wound.  Neurological: Denies dizziness, seizures,  syncope, weakness, light-headedness, numbness and headaches.  Hematological: Denies adenopathy. Easy bruising, personal or family bleeding history  Psychiatric/Behavioral: Denies suicidal ideation, mood changes, confusion, nervousness, sleep disturbance and agitation  Past Medical History  Diagnosis Date  . S/P gastric bypass   . Hypokalemia   . Anxiety and depression   . Abdominal pain      status post gastric bypass for bariatric surgical   management.  . Hyperlipidemia   . Hypertension   . Depression   . Anemia   . Anxiety   . Hypothyroid   . Prediabetes    Past Surgical History  Procedure Laterality Date  . Gastric bypass    . Cholecystectomy    . Tubal ligation    . Cervical fusion      C6-7   Social History:  reports that she has never smoked. She has never used smokeless tobacco. She reports that she does not drink alcohol or use illicit drugs.   Allergies  Allergen Reactions  . Shellfish Allergy Anaphylaxis  . Theophylline Anaphylaxis    Family History  Problem Relation Age of Onset  . Heart attack Mother   . Heart attack Father     Prior to Admission medications   Medication Sig Start Date End Date Taking? Authorizing Provider  acetaminophen (TYLENOL) 500 MG tablet Take 1,000 mg by mouth every 6 (six) hours as needed for moderate pain or headache.   Yes Historical Provider, MD  albuterol (PROAIR HFA) 108 (90 Base) MCG/ACT inhaler INHALE TWO PUFFS BY MOUTH 4 TIMES DAILY TO EVERY 4 HOURS 06/05/15  Yes Vicie Mutters, PA-C  ALPRAZolam Duanne Moron) 1 MG tablet Take 1 mg by mouth 3 (three) times daily as needed for anxiety.    Yes Historical Provider, MD  aspirin 81  MG EC tablet Take 81 mg by mouth daily.     Yes Historical Provider, MD  calcium carbonate (OS-CAL) 600 MG TABS Take 600 mg by mouth daily.     Yes Historical Provider, MD  Cholecalciferol (VITAMIN D3) 5000 units CAPS Take 10,000 Units by mouth daily.   Yes Historical Provider, MD  CINNAMON PO Take 1,000 mg  by mouth 3 (three) times daily.   Yes Historical Provider, MD  Cyanocobalamin (B-12) 2500 MCG TABS Take 2,500 mcg by mouth daily.   Yes Historical Provider, MD  Cyanocobalamin (VITAMIN B-12 PO) Take 1 tablet by mouth daily.   Yes Historical Provider, MD  diclofenac (VOLTAREN) 75 MG EC tablet Take 1 tablet (75 mg total) by mouth 2 (two) times daily. 06/06/15  Yes Max T Hyatt, DPM  EPINEPHrine (EPI-PEN) 0.3 mg/0.3 mL SOAJ injection Inject 0.3 mLs (0.3 mg total) into the muscle once. 07/17/13  Yes Melissa Smith, PA-C  ferrous sulfate 325 (65 FE) MG tablet Take 325 mg by mouth daily with breakfast.    Yes Historical Provider, MD  furosemide (LASIX) 40 MG tablet TAKE 1 TABLET BY MOUTH TWICE A DAY Patient taking differently: take 40 mgs daily as needed for swelling   Yes Vicie Mutters, PA-C  gabapentin (NEURONTIN) 300 MG capsule TAKE 1 CAPSULE (300 MG TOTAL) BY MOUTH 4 (FOUR) TIMES DAILY. Patient taking differently: TAKE 1 CAPSULE (300 MG TOTAL) BY MOUTH 4 (FOUR) TIMES DAILY as needed for restless legs 05/14/15  Yes Unk Pinto, MD  ipratropium-albuterol (DUONEB) 0.5-2.5 (3) MG/3ML SOLN USE 1 VIAL IN NEBULIZER 4 TIMES A DAY Patient taking differently: USE 1 VIAL IN NEBULIZER 4 TIMES A DAY as needed for shortness of breath   Yes Melissa Smith, PA-C  levothyroxine (SYNTHROID, LEVOTHROID) 50 MCG tablet TAKE 1 TABLET BY MOUTH ONCE DAILY 06/16/15  Yes Vicie Mutters, PA-C  MAGNESIUM PO Take 1 tablet by mouth 2 (two) times daily.   Yes Historical Provider, MD  QUEtiapine (SEROQUEL) 400 MG tablet Take 400 mg by mouth at bedtime as needed (sleep).  05/29/15  Yes Historical Provider, MD  Simethicone (GAS-X PO) Take 2 tablets by mouth daily as needed (gas).   Yes Historical Provider, MD  traMADol (ULTRAM) 50 MG tablet Take 1 tablet (50 mg total) by mouth every 8 (eight) hours as needed. for pain 06/14/15  Yes Max T Hyatt, DPM  traZODone (DESYREL) 100 MG tablet TAKE 1/2 TO 2 TABLETS AT BEDTIME 05/10/15  Yes Historical  Provider, MD  venlafaxine (EFFEXOR) 100 MG tablet Take 100 mg by mouth 4 (four) times daily - after meals and at bedtime.    Yes Historical Provider, MD  meloxicam (MOBIC) 15 MG tablet Take 1 tablet (15 mg total) by mouth daily. 04/23/15   Max T Milinda Pointer, DPM   Physical Exam: Filed Vitals:   07/05/15 1419 07/05/15 1619  BP: 145/82 127/70  Pulse: 63 67  Temp:  97.8 F (36.6 C)  Resp: 18 18     General: Well developed, well nourished, NAD, appears stated age  HEENT: NCAT, PERRLA, EOMI, Anicteic Sclera, mucous membranes moist.   Neck: Supple, no JVD, no masses  Cardiovascular: S1 S2 auscultated, no rubs, murmurs or gallops. Regular rate and rhythm.  Respiratory: Clear to auscultation bilaterally with equal chest rise  Abdomen: Soft, obese, RUQ/epigastric TTP, nondistended, no rebound or guarding, + bowel sounds Midline scar  Extremities: warm dry without cyanosis clubbing or edema  Neuro: AAOx3, cranial nerves grossly intact. Strength 5/5 in patient's  upper and lower extremities bilaterally  Skin: Without rashes exudates or nodules  Psych: Normal affect and demeanor with intact judgement and insight  Labs on Admission:  Basic Metabolic Panel:  Recent Labs Lab 07/05/15 1032  NA 137  K 3.9  CL 101  CO2 28  GLUCOSE 101*  BUN 10  CREATININE 1.56*  CALCIUM 9.6   Liver Function Tests:  Recent Labs Lab 07/05/15 1032  AST 89*  ALT 59*  ALKPHOS 146*  BILITOT 0.5  PROT 7.8  ALBUMIN 4.3    Recent Labs Lab 07/05/15 1032  LIPASE 22   No results for input(s): AMMONIA in the last 168 hours. CBC:  Recent Labs Lab 07/05/15 1032  WBC 7.0  HGB 14.4  HCT 44.6  MCV 89.6  PLT 305   Cardiac Enzymes: No results for input(s): CKTOTAL, CKMB, CKMBINDEX, TROPONINI in the last 168 hours.  BNP (last 3 results) No results for input(s): BNP in the last 8760 hours.  ProBNP (last 3 results) No results for input(s): PROBNP in the last 8760 hours.  CBG: No results for  input(s): GLUCAP in the last 168 hours.  Radiological Exams on Admission: Ct Abdomen Pelvis W Contrast  07/05/2015  CLINICAL DATA:  Upper abdominal pain with nausea. History of gastric bypass. EXAM: CT ABDOMEN AND PELVIS WITH CONTRAST TECHNIQUE: Multidetector CT imaging of the abdomen and pelvis was performed using the standard protocol following bolus administration of intravenous contrast. CONTRAST:  61mL ISOVUE-300 IOPAMIDOL (ISOVUE-300) INJECTION 61% COMPARISON:  09/30/2010 FINDINGS: Lower chest:  Visualized lung bases are unremarkable. Hepatobiliary: The liver has a normal appearance. The gallbladder has been removed. Mildly more prominent dilatation of the common bile duct and intrahepatic ducts post cholecystectomy with the CBD measuring up to 11-12 mm and maximal caliber. No evidence by CT of choledocholithiasis. This still remains likely biliary dilatation on the basis of prior cholecystectomy. Pancreas: Stable appearance of pancreas. No evidence of pancreatic mass. Spleen: Within normal limits in size and appearance. Adrenals/Urinary Tract: Right adrenal nodule again identified measuring 1.3 x 1.8 cm. This remains likely a stable adenoma. The kidneys have a normal appearance bilaterally without evidence of mass or hydronephrosis. No calculi are identified. The bladder is unremarkable. Stomach/Bowel: At the level of Roux-en-Y gastric bypass, oral contrast is seen in the isolated stomach as well as the distal native stomach and duodenum. There also is some oral contrast that has pass through into the jejunum. 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 This can lead to recurrent weight gain. True delineation of gastric emptying would require real-time imaging with upper GI series. There is no evidence to suggest perforation or obstruction. No free air is identified. Vascular/Lymphatic: No enlarged lymph nodes. No abnormal fluid collections. Reproductive: The uterus  and adnexal regions appear unremarkable by CT. Other: None. Musculoskeletal:  Bony structures are unremarkable. IMPRESSION: 1. 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 This is not of acute significance but can lead to recurrent weight gain. A formal upper GI series would be necessary to fully delineate the nature of gastric outflow. There is no evidence of bowel obstruction or perforation. 2. Mildly more prominent dilatation of the bile ducts post cholecystectomy with the CBD measuring 11-12 mm in maximal diameter. This still remains likely secondary to prior cholecystectomy with no visualized calculi in the duct or obstructing mass  identified by CT. Electronically Signed   By: Aletta Edouard M.D.   On: 07/05/2015 12:12    EKG: None  Assessment/Plan  RUQ Abdominal pain/? 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 Not of current significance. No evidence of bowel obstruction or perforation. Mildly more prominent dilatation of the bile ducts postcholecystectomy with the CBD measuring 11-12 mm in maximal diameter. -Gastroenterology, Dr. [name] contacted by EDP. Recommended MRCP. If stone found, would transfer to tertiary center. -Continue pain control and antiemetics as needed -Will place on full liquid diet.  Elevated LFTs -Likely secondary to the above -Continue to monitor CMP  Chronic kidney disease, stage III -Baseline creatinine the proximal 1.4-1.5 -Currently 1.59, continue to monitor BMP  Essential hypertension -Lasix held  Hypothyroidism -Continue Synthroid  Depression anxiety -Continue Xanax, Seroquel, Effexor   Asthma -Currently stable, no wheezing -Continue home medications  DVT prophylaxis: Lovenox  Code Status: Full  Condition: Guarded   Family  Communication: None at bedside. Admission, patients condition and plan of care including tests being ordered have been discussed with the patient, who indicates understanding and agrees with the plan and Code Status.  Disposition Plan: Admitted.  Time spent: 60 minutes  Maekayla Giorgio D.O. [facility] Pager [phone]  If 7PM-7AM, please contact night-coverage [email or web address] Password [facility] 07/05/2015, 4:32 PM

## 2015-07-05 NOTE — ED Provider Notes (Signed)
CSN: [id number]     Arrival date & time 07/05/15  [id number] History   First MD Initiated Contact with Patient 07/05/15 1027     Chief Complaint  Patient presents with  . Abdominal Pain     (Consider location/radiation/quality/duration/timing/severity/associated sxs/prior Treatment) HPI Comments: Patient with history of gastric bypass surgery in 2003 in [place], cholecystectomy --presents with 3 days of acute onset, gradually worsening upper abdominal pain with associated nausea and dry heaving. Patient has had an increased feeling of abdominal distention and bloating. No chest pain or shortness of breath. No diarrhea. Last had a small bowel movement yesterday. No decrease in urine or dysuria. No treatments prior to arrival. Aggravating factors: none. Alleviating factors: none.    Patient is a 65 y.o. female presenting with abdominal pain. The history is provided by the patient.  Abdominal Pain Associated symptoms: nausea   Associated symptoms: no chest pain, no cough, no diarrhea, no dysuria, no fever, no sore throat and no vomiting     Past Medical History  Diagnosis Date  . S/P gastric bypass   . Hypokalemia   . Anxiety and depression   . Abdominal pain      status post gastric bypass for bariatric surgical   management.  . Hyperlipidemia   . Hypertension   . Depression   . Anemia   . Anxiety   . Hypothyroid   . Prediabetes    Past Surgical History  Procedure Laterality Date  . Gastric bypass    . Cholecystectomy    . Tubal ligation    . Cervical fusion      C6-7   Family History  Problem Relation Age of Onset  . Heart attack Mother   . Heart attack Father    Social History  Substance Use Topics  . Smoking status: Never Smoker   . Smokeless tobacco: Never Used  . Alcohol Use: No   OB History    No data available     Review of Systems  Constitutional: Negative for fever.  HENT: Negative for rhinorrhea and sore throat.   Eyes: Negative for redness.   Respiratory: Negative for cough.   Cardiovascular: Negative for chest pain.  Gastrointestinal: Positive for nausea, abdominal pain and abdominal distention. Negative for vomiting and diarrhea.  Genitourinary: Negative for dysuria.  Musculoskeletal: Negative for myalgias.  Skin: Negative for rash.  Neurological: Negative for headaches.      Allergies  Shellfish allergy and Theophylline  Home Medications   Prior to Admission medications   Medication Sig Start Date End Date Taking? Authorizing Provider  albuterol (PROAIR HFA) 108 (90 Base) MCG/ACT inhaler INHALE TWO PUFFS BY MOUTH 4 TIMES DAILY TO EVERY 4 HOURS 06/05/15   Vicie Mutters, PA-C  albuterol (PROVENTIL,VENTOLIN) 90 MCG/ACT inhaler Inhale 2 puffs into the lungs every 6 (six) hours as needed for wheezing or shortness of breath.     Historical Provider, MD  ALPRAZolam Duanne Moron) 1 MG tablet Take 1 mg by mouth 4 (four) times daily.     Historical Provider, MD  aspirin 81 MG EC tablet Take 81 mg by mouth daily.      Historical Provider, MD  calcium carbonate (OS-CAL) 600 MG TABS Take 600 mg by mouth daily.      Historical Provider, MD  Cholecalciferol (VITAMIN D) 2000 UNITS CAPS Take 2 capsules by mouth daily.     Historical Provider, MD  CINNAMON PO Take 1,000 mg by mouth 3 (three) times daily.    Historical Provider, MD  Cyanocobalamin (VITAMIN B-12 PO) Take 1 tablet by mouth daily.    Historical Provider, MD  diclofenac (VOLTAREN) 75 MG EC tablet Take 1 tablet (75 mg total) by mouth 2 (two) times daily. 06/06/15   Max T Hyatt, DPM  EPINEPHrine (EPI-PEN) 0.3 mg/0.3 mL SOAJ injection Inject 0.3 mLs (0.3 mg total) into the muscle once. 07/17/13   Kelby Aline, PA-C  ferrous sulfate 325 (65 FE) MG tablet Take 325 mg by mouth daily with breakfast.     Historical Provider, MD  furosemide (LASIX) 40 MG tablet TAKE 1 TABLET BY MOUTH TWICE A DAY    Vicie Mutters, PA-C  gabapentin (NEURONTIN) 300 MG capsule TAKE 1 CAPSULE (300 MG TOTAL) BY  MOUTH 4 (FOUR) TIMES DAILY. 05/14/15   Unk Pinto, MD  ipratropium-albuterol (DUONEB) 0.5-2.5 (3) MG/3ML SOLN USE 1 VIAL IN NEBULIZER 4 TIMES A DAY    Melissa Smith, PA-C  levothyroxine (SYNTHROID, LEVOTHROID) 50 MCG tablet TAKE 1 TABLET BY MOUTH ONCE DAILY 06/16/15   Vicie Mutters, PA-C  MAGNESIUM PO Take 1 tablet by mouth 2 (two) times daily.    Historical Provider, MD  meloxicam (MOBIC) 15 MG tablet Take 1 tablet (15 mg total) by mouth daily. 04/23/15   Max T Hyatt, DPM  QUEtiapine (SEROQUEL) 400 MG tablet Take 800 mg by mouth at bedtime. 05/29/15   Historical Provider, MD  traMADol (ULTRAM) 50 MG tablet Take 1 tablet (50 mg total) by mouth every 8 (eight) hours as needed. for pain 06/14/15   Max T Hyatt, DPM  traZODone (DESYREL) 100 MG tablet TAKE 1/2 TO 2 TABLETS AT BEDTIME 05/10/15   Historical Provider, MD  venlafaxine (EFFEXOR) 100 MG tablet Take 100 mg by mouth 3 (three) times daily.    Historical Provider, MD   BP 127/77 mmHg  Pulse 94  Temp(Src) 98.7 F (37.1 C) (Oral)  Resp 16  SpO2 100% Physical Exam  Constitutional: She appears well-developed and well-nourished.  HENT:  Head: Normocephalic and atraumatic.  Mouth/Throat: Oropharynx is clear and moist.  Eyes: Conjunctivae are normal. Right eye exhibits no discharge. Left eye exhibits no discharge.  Neck: Normal range of motion. Neck supple.  Cardiovascular: Normal rate, regular rhythm and normal heart sounds.   Pulmonary/Chest: Effort normal and breath sounds normal. No respiratory distress. She has no wheezes. She has no rales.  Abdominal: Soft. She exhibits no distension. Bowel sounds are decreased. There is tenderness in the right upper quadrant, epigastric area and left upper quadrant. There is no rebound and no guarding.  Neurological: She is alert.  Skin: Skin is warm and dry.  Psychiatric: She has a normal mood and affect.  Nursing note and vitals reviewed.   ED Course  Procedures (including critical care  time) Labs Review Labs Reviewed  COMPREHENSIVE METABOLIC PANEL - Abnormal; Notable for the following:    Glucose, Bld 101 (*)    Creatinine, Ser 1.56 (*)    AST 89 (*)    ALT 59 (*)    Alkaline Phosphatase 146 (*)    GFR calc non Af Amer 34 (*)    GFR calc Af Amer 39 (*)    All other components within normal limits  LIPASE, BLOOD  CBC  URINALYSIS, ROUTINE W REFLEX MICROSCOPIC (NOT AT [facility])    Imaging Review Ct Abdomen Pelvis W Contrast  07/05/2015  CLINICAL DATA:  Upper abdominal pain with nausea. History of gastric bypass. EXAM: CT ABDOMEN AND PELVIS WITH CONTRAST TECHNIQUE: Multidetector CT imaging of the abdomen and  pelvis was performed using the standard protocol following bolus administration of intravenous contrast. CONTRAST:  57mL ISOVUE-300 IOPAMIDOL (ISOVUE-300) INJECTION 61% COMPARISON:  09/30/2010 FINDINGS: Lower chest:  Visualized lung bases are unremarkable. Hepatobiliary: The liver has a normal appearance. The gallbladder has been removed. Mildly more prominent dilatation of the common bile duct and intrahepatic ducts post cholecystectomy with the CBD measuring up to 11-12 mm and maximal caliber. No evidence by CT of choledocholithiasis. This still remains likely biliary dilatation on the basis of prior cholecystectomy. Pancreas: Stable appearance of pancreas. No evidence of pancreatic mass. Spleen: Within normal limits in size and appearance. Adrenals/Urinary Tract: Right adrenal nodule again identified measuring 1.3 x 1.8 cm. This remains likely a stable adenoma. The kidneys have a normal appearance bilaterally without evidence of mass or hydronephrosis. No calculi are identified. The bladder is unremarkable. Stomach/Bowel: At the level of Roux-en-Y gastric bypass, oral contrast is seen in the isolated stomach as well as the distal native stomach and duodenum. There also is some oral contrast that has pass through into the jejunum. Based on appearance, is possible that there is a  gastric -gastric fistula present. This can lead to recurrent weight gain. True delineation of gastric emptying would require real-time imaging with upper GI series. There is no evidence to suggest perforation or obstruction. No free air is identified. Vascular/Lymphatic: No enlarged lymph nodes. No abnormal fluid collections. Reproductive: The uterus and adnexal regions appear unremarkable by CT. Other: None. Musculoskeletal:  Bony structures are unremarkable. IMPRESSION: 1. 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 This is not of acute significance but can lead to recurrent weight gain. A formal upper GI series would be necessary to fully delineate the nature of gastric outflow. There is no evidence of bowel obstruction or perforation. 2. Mildly more prominent dilatation of the bile ducts post cholecystectomy with the CBD measuring 11-12 mm in maximal diameter. This still remains likely secondary to prior cholecystectomy with no visualized calculi in the duct or obstructing mass identified by CT. Electronically Signed   By: Aletta Edouard M.D.   On: 07/05/2015 12:12   I have personally reviewed and evaluated these images and lab results as part of my medical decision-making.    10:41 AM Patient seen and examined. Work-up initiated. Medications ordered.   Vital signs reviewed and are as follows: BP 127/77 mmHg  Pulse 94  Temp(Src) 98.7 F (37.1 C) (Oral)  Resp 16  SpO2 100%  CT with findings as above. Patient discussed with and seen by Dr. Winfred Leeds.  Spoke with Dr. [name] by telephone. If there is concern for biliary stone, patient will need MRCP. If this is positive for stone, patient will likely need to be transferred to tertiary care center such as [place] with Dr. Ree Kida who will admit patient.   Patient updated and agreed with plan.    MDM   Final diagnoses:  Upper abdominal pain   Admit.    Carlisle Cater, PA-C 07/05/15 [place], MD 07/05/15 [phone]

## 2015-07-05 NOTE — ED Provider Notes (Signed)
Complains of epigastric pain, [place] by nausea onset 4 days ago. Pain is worse with drinking cold water and was exacerbated when she bumped her abdomen on the kitchen counter this morning. She's treated self with Gas-X and with over-the-counter antiemetics, without relief. She presently states her pain is controlled improved since treatment in the emergency department on exam patient is alert nontoxic abdomen obese, midline surgical scar, bowel sounds quiet. Tender at upper quadrants and at epigastrium. No guarding rigidity or rebound  Orlie Dakin, MD 07/05/15 1624

## 2015-07-05 NOTE — ED Notes (Signed)
Patient transported to CT 

## 2015-07-06 DIAGNOSIS — R1011 Right upper quadrant pain: Secondary | ICD-10-CM | POA: Diagnosis not present

## 2015-07-06 DIAGNOSIS — R7989 Other specified abnormal findings of blood chemistry: Secondary | ICD-10-CM

## 2015-07-06 DIAGNOSIS — R932 Abnormal findings on diagnostic imaging of liver and biliary tract: Secondary | ICD-10-CM | POA: Diagnosis not present

## 2015-07-06 LAB — CBC
HEMATOCRIT: 37.8 % (ref 36.0–46.0)
HEMOGLOBIN: 12 g/dL (ref 12.0–15.0)
MCH: 28.7 pg (ref 26.0–34.0)
MCHC: 31.7 g/dL (ref 30.0–36.0)
MCV: 90.4 fL (ref 78.0–100.0)
Platelets: 225 10*3/uL (ref 150–400)
RBC: 4.18 MIL/uL (ref 3.87–5.11)
RDW: 13.4 % (ref 11.5–15.5)
WBC: 3.8 10*3/uL — ABNORMAL LOW (ref 4.0–10.5)

## 2015-07-06 LAB — COMPREHENSIVE METABOLIC PANEL
ALBUMIN: 3.5 g/dL (ref 3.5–5.0)
ALK PHOS: 173 U/L — AB (ref 38–126)
ALT: 135 U/L — ABNORMAL HIGH (ref 14–54)
ANION GAP: 6 (ref 5–15)
AST: 314 U/L — AB (ref 15–41)
BUN: 13 mg/dL (ref 6–20)
CALCIUM: 9.3 mg/dL (ref 8.9–10.3)
CO2: 29 mmol/L (ref 22–32)
Chloride: 104 mmol/L (ref 101–111)
Creatinine, Ser: 1.48 mg/dL — ABNORMAL HIGH (ref 0.44–1.00)
GFR calc Af Amer: 42 mL/min — ABNORMAL LOW (ref >60)
GFR calc non Af Amer: 36 mL/min — ABNORMAL LOW (ref >60)
GLUCOSE: 97 mg/dL (ref 65–99)
POTASSIUM: 5.1 mmol/L (ref 3.5–5.1)
SODIUM: 139 mmol/L (ref 135–145)
Total Bilirubin: 0.5 mg/dL (ref 0.3–1.2)
Total Protein: 6.3 g/dL — ABNORMAL LOW (ref 6.5–8.1)

## 2015-07-06 MED ORDER — HYDROMORPHONE HCL 1 MG/ML IJ SOLN
1.0000 mg | INTRAMUSCULAR | Status: AC | PRN
  Administered 2015-07-07 (×2): 1 mg via INTRAVENOUS
  Filled 2015-07-06 (×2): qty 1

## 2015-07-06 MED ORDER — ONDANSETRON HCL 4 MG/2ML IJ SOLN
4.0000 mg | Freq: Four times a day (QID) | INTRAMUSCULAR | Status: DC | PRN
  Administered 2015-07-06: 4 mg via INTRAVENOUS
  Filled 2015-07-06: qty 2

## 2015-07-06 MED ORDER — DIPHENHYDRAMINE HCL 25 MG PO CAPS
25.0000 mg | ORAL_CAPSULE | Freq: Three times a day (TID) | ORAL | Status: DC | PRN
  Administered 2015-07-06 – 2015-07-08 (×4): 25 mg via ORAL
  Filled 2015-07-06 (×4): qty 1

## 2015-07-06 MED ORDER — HYDROMORPHONE HCL 1 MG/ML IJ SOLN
1.0000 mg | INTRAMUSCULAR | Status: AC | PRN
  Administered 2015-07-06 (×3): 1 mg via INTRAVENOUS
  Filled 2015-07-06 (×3): qty 1

## 2015-07-06 NOTE — Care Management Obs Status (Signed)
[facility] NOTIFICATION   Patient Details  Name: Lauren Mckenzie MRN: [id number] Date of Birth: 1950-07-14   Medicare Observation Status Notification Given:  Yes    Erenest Rasher, RN 07/06/2015, 10:33 AM

## 2015-07-06 NOTE — Consult Note (Signed)
[facility] Consult Note   History Lauren Mckenzie MRN # [id number]  Date of Admission: 07/05/2015 Date of Consultation: 07/06/2015 Referring physician: Dr. Caren Griffins, MD  Reason for Consultation/Chief Complaint: RUQ pain, elevated LFTs  Subjective HPI:  Patient of Dr [name].  Came to ED yesterday after several days of steadily worsening sharp epigastric to RUQ pain, now settling in the RUQ.  No radiation to back.  No nausea or vomiting.  No fever , chest pain or dyspnea.  Not sure if reminiscent of any prior pain episode.  LFTs were normal last month, they are now elevated and have risen from yesterday.  Still requires IV pain meds which help for a few hrs. ED called [name], who did not see in consult but reportedly recommended the MRCP.  ROS:  Denies weight loss Psychiatric:   Anxiety d/o  All other systems are negative except as noted above in the HPI  Past Medical History Past Medical History  Diagnosis Date  . S/P gastric bypass   . Hypokalemia   . Anxiety and depression   . Abdominal pain      status post gastric bypass for bariatric surgical   management.  . Hyperlipidemia   . Hypertension   . Depression   . Anemia   . Anxiety   . Hypothyroid   . Prediabetes     Past Surgical History Past Surgical History  Procedure Laterality Date  . Gastric bypass    . Cholecystectomy    . Tubal ligation    . Cervical fusion      C6-7    Family History Family History  Problem Relation Age of Onset  . Heart attack Mother   . Heart attack Father     Social History Social History   Social History  . Marital Status: Divorced    Spouse Name: N/A  . Number of Children: N/A  . Years of Education: N/A   Occupational History  . unemployed    Social History Main Topics  . Smoking status: Never Smoker   . Smokeless tobacco: Never Used  . Alcohol Use: No  . Drug Use: No  . Sexual Activity: Yes   Other Topics Concern  . None   Social History  Narrative   Has chihuahua      Never smoked, no alcohol    Allergies Allergies  Allergen Reactions  . Shellfish Allergy Anaphylaxis  . Theophylline Anaphylaxis    Outpatient Meds Home medications from the H+P and/or nursing med reconciliation reviewed.  Inpatient med list reviewed  _____________________________________________________________________ Objective  Exam:  Current vital signs  Patient Vitals for the past 8 hrs:  BP Temp Temp src Pulse Resp SpO2  07/06/15 0435 132/70 mmHg 98.9 F (37.2 C) Oral 97 20 96 %   No intake or output data in the 24 hours ending 07/06/15 1148  Physical Exam:    General: this is a non-toxic morbidly obese female patient in no acute distress  Eyes: sclera anicteric, no redness  ENT: oral mucosa moist without lesions, no cervical or supraclavicular lymphadenopathy, good dentition  CV: RRR without murmur, S1/S2, no JVD,, no peripheral edema  Resp: clear to auscultation bilaterally, normal RR and effort noted  GI: soft, mild RUQ tenderness, with active bowel sounds. No guarding or palpable organomegaly noted  Skin; warm and dry, no rash or jaundice noted  Neuro: awake, alert and oriented x 3. Normal gross motor function and fluent speech.  Labs:   Recent [employer]  07/05/15 1032 07/06/15 0554  WBC 7.0 3.8*  HGB 14.4 12.0  HCT 44.6 37.8  PLT 305 225    Recent Labs Lab 07/06/15 0554  NA 139  K 5.1  CL 104  CO2 29  BUN 13  ALBUMIN 3.5  ALKPHOS 173*  ALT 135*  AST 314*  GLUCOSE 97   No results for input(s): INR in the last 168 hours. LFTs up from yesterday  Radiologic studies: CT scan report reviewed.  Extrahepatic Biliary dilatation, but reportedly "minimally increased" from 2012.  However, the 2012 scan does not mention biliary ductal dilatation.  There is also a probable re-opening of connection between gastric pouch and the bypassed stomach. MRCP yesterday not yet read.  I personally reviewed the images,  which are very good quality.  The CBD is diffusely dilated, tapers at the end, but I see no filling defect.  Awaiting official reading.  @ASSESSMENTPLANBEGIN @ Impression:  RUQ pain Elevated LFTs.  This is new from last month.  Not typical pattern for CBD stone/sludge, but possible. Biliary ductal dilatation of unclear chronicity and significance to symptoms.  She may have a small stone or sludge in the biliary tree.  Fortunately, no signs of cholangitis, WBC 3.9, and bilirubin currently normal. So so she does not appear to be in any imminent danger from whatever this is.  Plan:  Await final MRCP report. See LFTs and CBC tomorrow.  If bilirubin elevated and/or stone felt to be present on MRCP, and ERCp seems indicated,  she will need transfer to [facility] for advanced biliary endoscopist b/c of her gastric bypass. I discussed this with the patient and hospitalist. OK to eat today   Thank you for the courtesy of this consult.  Please contact me with any questions or concerns.  Nelida Meuse III Pager: [phone] Mon-Fri 8a-5p [phone] after 5p, weekends, holidays

## 2015-07-06 NOTE — Progress Notes (Signed)
PROGRESS NOTE  Lauren Mckenzie [id number] DOB: 11-Feb-1951 DOA: 07/05/2015 PCP: [name], MD Outpatient Specialists:      Brief Narrative: 65 year old lady with history of gastric bypass in 2003 at [facility], hypertension, admitted on 4/7 with abdominal pain, nausea, found to have dilated common bile duct on CT scan.  Assessment & Plan: Active Problems:   Asthma, chronic   Hypertension   Depression   Anxiety   Hypothyroid   Abdominal pain   RUQ Abdominal pain/? 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 Not of current significance. No evidence of bowel obstruction or perforation. Mildly more prominent dilatation of the bile ducts postcholecystectomy with the CBD measuring 11-12 mm in maximal diameter. - MRCP done overnight, read is still pending, I reviewed imaging and do not see a clear filling defect, however CBD looks indeed dilated. I discussed with gastroenterology Dr. Loletha Carrow, appreciate consultation. Not 100% sure patient will need an ERCP, however if she does we'll need to be done at the tertiary center and patient will need to be transferred. Procedure is technically very difficult due to her history of gastric bypass  Elevated LFTs - Likely secondary to the above - Liver enzymes increasing since yesterday, AST and ALT, however bilirubin remain normal - Continue to monitor  Chronic kidney disease, stage III - Baseline creatinine 1.4-1.5, creatinine stable  Essential hypertension - Lasix held  Hypothyroidism - Continue Synthroid  Depression anxiety - Continue Xanax, Seroquel, Effexor   Asthma - Currently stable, no wheezing - Continue home medications   DVT prophylaxis: Lovenox Code Status: Full code Family Communication: No family at bedside Disposition Plan: Home when ready Barriers for discharge: Monitor LFTs, clinical course  Consultants:    Gastroenterology  Procedures:   None  Antimicrobials:  None   Subjective: - Patient with ongoing abdominal pain, nausea. Denies any vomiting. Feels a little bit better than yesterday. No chest pain or shortness of breath. No lightheadedness or dizziness.  Objective: Filed Vitals:   07/05/15 1638 07/05/15 1908 07/05/15 2135 07/06/15 0435  BP: 143/65  140/77 132/70  Pulse: 71  73 97  Temp: 97.8 F (36.6 C)  98.6 F (37 C) 98.9 F (37.2 C)  TempSrc: Oral  Oral Oral  Resp: 20  20 20   Height:  5\' 5"  (1.651 m)    Weight:  102.921 kg (226 lb 14.4 oz)    SpO2: 95%  95% 96%   No intake or output data in the 24 hours ending 07/06/15 1157 Filed Weights   07/05/15 1908  Weight: 102.921 kg (226 lb 14.4 oz)    Examination: Constitutional: NAD Filed Vitals:   07/05/15 1638 07/05/15 1908 07/05/15 2135 07/06/15 0435  BP: 143/65  140/77 132/70  Pulse: 71  73 97  Temp: 97.8 F (36.6 C)  98.6 F (37 C) 98.9 F (37.2 C)  TempSrc: Oral  Oral Oral  Resp: 20  20 20   Height:  5\' 5"  (1.651 m)    Weight:  102.921 kg (226 lb 14.4 oz)    SpO2: 95%  95% 96%   Eyes: PERRL, No scleral icterus ENMT: Mucous membranes are moist. Posterior pharynx clear of any exudate or lesions.Normal dentition.  Neck: normal, supple, no masses, no thyromegaly Respiratory: clear to auscultation bilaterally, no wheezing, no crackles.  Cardiovascular: Regular rate and rhythm, no murmurs / rubs / gallops. Abdomen: slight epigastric tenderness, no masses palpated. Bowel sounds positive.  Neurologic: non focal  Psychiatric: Normal judgment and insight. Alert and oriented x 3. Normal mood.    Data Reviewed: I have personally reviewed following labs and imaging studies  CBC:  Recent Labs Lab 07/05/15 1032 07/06/15 0554  WBC 7.0 3.8*  HGB 14.4 12.0  HCT 44.6 37.8  MCV 89.6 90.4  PLT 305 [id number]   Basic Metabolic Panel:  Recent Labs Lab 07/05/15 1032 07/06/15 0554  NA 137 139  K 3.9 5.1  CL 101  104  CO2 28 29  GLUCOSE 101* 97  BUN 10 13  CREATININE 1.56* 1.48*  CALCIUM 9.6 9.3   GFR: Estimated Creatinine Clearance: 45.1 mL/min (by C-G formula based on Cr of 1.48). Liver Function Tests:  Recent Labs Lab 07/05/15 1032 07/06/15 0554  AST 89* 314*  ALT 59* 135*  ALKPHOS 146* 173*  BILITOT 0.5 0.5  PROT 7.8 6.3*  ALBUMIN 4.3 3.5    Recent Labs Lab 07/05/15 1032  LIPASE 22   No results for input(s): AMMONIA in the last 168 hours. Coagulation Profile: No results for input(s): INR, PROTIME in the last 168 hours. Cardiac Enzymes: No results for input(s): CKTOTAL, CKMB, CKMBINDEX, TROPONINI in the last 168 hours. BNP (last 3 results) No results for input(s): PROBNP in the last 8760 hours. HbA1C: No results for input(s): HGBA1C in the last 72 hours. CBG: No results for input(s): GLUCAP in the last 168 hours. Lipid Profile: No results for input(s): CHOL, HDL, LDLCALC, TRIG, CHOLHDL, LDLDIRECT in the last 72 hours. Thyroid Function Tests: No results for input(s): TSH, T4TOTAL, FREET4, T3FREE, THYROIDAB in the last 72 hours. Anemia Panel: No results for input(s): VITAMINB12, FOLATE, FERRITIN, TIBC, IRON, RETICCTPCT in the last 72 hours. Urine analysis:    Component Value Date/Time   COLORURINE YELLOW 07/05/2015 1215   APPEARANCEUR CLEAR 07/05/2015 1215   LABSPEC 1.030 07/05/2015 1215   PHURINE 6.0 07/05/2015 1215   GLUCOSEU NEGATIVE 07/05/2015 1215   HGBUR NEGATIVE 07/05/2015 [place] 07/05/2015 1215   KETONESUR NEGATIVE 07/05/2015 1215   PROTEINUR NEGATIVE 07/05/2015 1215   UROBILINOGEN 0.2 02/24/2013 1030   NITRITE NEGATIVE 07/05/2015 1215   LEUKOCYTESUR NEGATIVE 07/05/2015 1215   Sepsis Labs: Invalid input(s): PROCALCITONIN, LACTICIDVEN  No results found for this or any previous visit (from the past 240 hour(s)).   Radiology Studies: Ct Abdomen Pelvis W Contrast  07/05/2015  CLINICAL DATA:  Upper abdominal pain with nausea. History  of gastric bypass. EXAM: CT ABDOMEN AND PELVIS WITH CONTRAST TECHNIQUE: Multidetector CT imaging of the abdomen and pelvis was performed using the standard protocol following bolus administration of intravenous contrast. CONTRAST:  78mL ISOVUE-300 IOPAMIDOL (ISOVUE-300) INJECTION 61% COMPARISON:  09/30/2010 FINDINGS: Lower chest:  Visualized lung bases are unremarkable. Hepatobiliary: The liver has a normal appearance. The gallbladder has been removed. Mildly more prominent dilatation of the common bile duct and intrahepatic ducts post cholecystectomy with the CBD measuring up to 11-12 mm and maximal caliber. No evidence by CT of choledocholithiasis. This still remains likely biliary dilatation on the basis of prior cholecystectomy. Pancreas: Stable appearance of pancreas. No evidence of pancreatic mass. Spleen: Within normal limits in size and appearance. Adrenals/Urinary Tract: Right adrenal nodule again identified measuring 1.3 x 1.8 cm. This remains likely a stable adenoma. The kidneys have a normal appearance bilaterally without evidence of mass or hydronephrosis. No calculi are identified. The bladder is unremarkable. Stomach/Bowel: At the level of Roux-en-Y gastric bypass, oral contrast is seen in the isolated stomach as well as the distal  native stomach and duodenum. There also is some oral contrast that has pass through into the jejunum. 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 This can lead to recurrent weight gain. True delineation of gastric emptying would require real-time imaging with upper GI series. There is no evidence to suggest perforation or obstruction. No free air is identified. Vascular/Lymphatic: No enlarged lymph nodes. No abnormal fluid collections. Reproductive: The uterus and adnexal regions appear unremarkable by CT. Other: None. Musculoskeletal:  Bony structures are unremarkable. IMPRESSION: 1. 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 This is not of acute significance but can lead to recurrent weight gain. A formal upper GI series would be necessary to fully delineate the nature of gastric outflow. There is no evidence of bowel obstruction or perforation. 2. Mildly more prominent dilatation of the bile ducts post cholecystectomy with the CBD measuring 11-12 mm in maximal diameter. This still remains likely secondary to prior cholecystectomy with no visualized calculi in the duct or obstructing mass identified by CT. Electronically Signed   By: Aletta Edouard M.D.   On: 07/05/2015 12:12   Scheduled Meds: . aspirin  81 mg Oral Daily  . calcium carbonate  1 tablet Oral Q breakfast  . cholecalciferol  10,000 Units Oral Daily  . enoxaparin (LOVENOX) injection  40 mg Subcutaneous Q24H  . ferrous sulfate  325 mg Oral Q breakfast  . levothyroxine  50 mcg Oral QAC breakfast  . sodium chloride flush  3 mL Intravenous Q12H  . venlafaxine  100 mg Oral TID PC & HS  . vitamin B-12  1,000 mcg Oral Daily   Continuous Infusions: . sodium chloride 75 mL/hr at 07/06/15 0921    Marzetta Board, MD, PhD [facility] Pager [phone]  If 7PM-7AM, please contact night-coverage [email or web address] Password [facility] 07/06/2015, 11:57 AM

## 2015-07-07 DIAGNOSIS — R1011 Right upper quadrant pain: Secondary | ICD-10-CM | POA: Diagnosis not present

## 2015-07-07 DIAGNOSIS — R932 Abnormal findings on diagnostic imaging of liver and biliary tract: Secondary | ICD-10-CM | POA: Diagnosis not present

## 2015-07-07 DIAGNOSIS — R7989 Other specified abnormal findings of blood chemistry: Secondary | ICD-10-CM | POA: Diagnosis not present

## 2015-07-07 LAB — CBC
HEMATOCRIT: 37.2 % (ref 36.0–46.0)
Hemoglobin: 11.7 g/dL — ABNORMAL LOW (ref 12.0–15.0)
MCH: 27.9 pg (ref 26.0–34.0)
MCHC: 31.5 g/dL (ref 30.0–36.0)
MCV: 88.8 fL (ref 78.0–100.0)
PLATELETS: 210 10*3/uL (ref 150–400)
RBC: 4.19 MIL/uL (ref 3.87–5.11)
RDW: 13.1 % (ref 11.5–15.5)
WBC: 4.9 10*3/uL (ref 4.0–10.5)

## 2015-07-07 LAB — COMPREHENSIVE METABOLIC PANEL
ALBUMIN: 3.5 g/dL (ref 3.5–5.0)
ALT: 128 U/L — AB (ref 14–54)
AST: 163 U/L — AB (ref 15–41)
Alkaline Phosphatase: 189 U/L — ABNORMAL HIGH (ref 38–126)
Anion gap: 5 (ref 5–15)
BILIRUBIN TOTAL: 0.4 mg/dL (ref 0.3–1.2)
BUN: 10 mg/dL (ref 6–20)
CHLORIDE: 106 mmol/L (ref 101–111)
CO2: 29 mmol/L (ref 22–32)
CREATININE: 1.4 mg/dL — AB (ref 0.44–1.00)
Calcium: 9 mg/dL (ref 8.9–10.3)
GFR calc Af Amer: 45 mL/min — ABNORMAL LOW (ref >60)
GFR, EST NON AFRICAN AMERICAN: 38 mL/min — AB (ref >60)
GLUCOSE: 100 mg/dL — AB (ref 65–99)
Potassium: 4.5 mmol/L (ref 3.5–5.1)
Sodium: 140 mmol/L (ref 135–145)
Total Protein: 6.2 g/dL — ABNORMAL LOW (ref 6.5–8.1)

## 2015-07-07 MED ORDER — HYDROMORPHONE HCL 1 MG/ML IJ SOLN
1.0000 mg | INTRAMUSCULAR | Status: DC | PRN
  Administered 2015-07-07 – 2015-07-08 (×3): 1 mg via INTRAVENOUS
  Filled 2015-07-07 (×3): qty 1

## 2015-07-07 NOTE — Progress Notes (Signed)
[facility] Progress Note  Chief Complaint: RUQ pain  Subjective History:  Pain improved today, no n/v No fever Tolerating full liquids  ROS: Cardiovascular:  no chest pain Respiratory: no dyspnea  Objective:  Med list reviewed  Vital signs in last 24 hrs: Filed Vitals:   07/06/15 2131 07/07/15 0642  BP: 149/80 164/93  Pulse: 84 74  Temp: 97.8 F (36.6 C) 97.9 F (36.6 C)  Resp: 18 18    Physical Exam   HEENT: sclera anicteric, oral mucosa moist without lesions  Neck: supple, no thyromegaly, JVD or lymphadenopathy  Cardiac: RRR without murmurs, S1S2 heard, no peripheral edema  Pulm: clear to auscultation bilaterally, normal RR and effort noted  Abdomen: morbidly obese, soft, mild RUQ tenderness, with active bowel sounds. No guarding or palpable hepatosplenomegaly  Skin; warm and dry, no jaundice or rash  Recent Labs:   Recent Labs Lab 07/05/15 1032 07/06/15 0554 07/07/15 0543  WBC 7.0 3.8* 4.9  HGB 14.4 12.0 11.7*  HCT 44.6 37.8 37.2  PLT 305 225 210    Recent Labs Lab 07/06/15 0554  NA 139  K 5.1  CL 104  CO2 29  BUN 13  ALBUMIN 3.5  ALKPHOS 173*  ALT 135*  AST 314*  GLUCOSE 97   No results for input(s): INR in the last 168 hours. AM LFTs pending Radiologic studies: MRCP - no bile duct stone  @ASSESSMENTPLANBEGIN @ Assessment:  RUQ pain Elevated LFTs Diffusely dilated CBD  I suspect she either passed a small stone or sludge (assuming LFTs are better today), or she has sphincter of oddi dysfunction.  She does not take narcotics regularly. She looks well, not jaundice, no signs of cholangitis.  No need to send her out to another hospital at this point.  Plan: Await AM LFTs Low fat diet Keep her here until tomorrow LFTs tomorrow Dr [name] will see her tomorrow - I will sign the case out to him.   Nelida Meuse III Pager [phone] Mon-Fri 8a-5p [phone] after 5p, weekends, holidays

## 2015-07-07 NOTE — Progress Notes (Signed)
PROGRESS NOTE  Lauren Mckenzie [id number] DOB: [birth year] DOA: 07/05/2015 PCP: [name], MD Outpatient Specialists:      Brief Narrative: 65 year old lady with history of gastric bypass in 2003 at [facility], hypertension, admitted on 4/7 with abdominal pain, nausea, found to have dilated common bile duct on CT scan.  Assessment & Plan: Active Problems:   Asthma, chronic   Hypertension   Depression   Anxiety   Hypothyroid   Abdominal pain   RUQ Abdominal pain/? 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 Not of current significance. No evidence of bowel obstruction or perforation. Mildly more prominent dilatation of the bile ducts postcholecystectomy with the CBD measuring 11-12 mm in maximal diameter. - MRCP done 4/8, no evidence of stone or extrinsic compression - LFTs improving today, clinically she is improving - discussed with gastroenterology Dr. Loletha Carrow, appreciate consultation, continue to monitor, she may have had a stone or sludge which is over the past versus sphincter of Oddi dysfunction  Elevated LFTs - Likely secondary to the above - AST and ALT improving, bilirubin remains within normal limits - Continue to monitor  Chronic kidney disease, stage III - Baseline creatinine 1.4-1.5, creatinine stable  Essential hypertension - Lasix held  Hypothyroidism - Continue Synthroid  Depression anxiety - Continue Xanax, Seroquel, Effexor   Asthma - Currently stable, no wheezing - Continue home medications   DVT prophylaxis: Lovenox Code Status: Full code Family Communication: No family at bedside Disposition Plan: Home when ready Barriers for discharge: Monitor LFTs, clinical course  Consultants:   Gastroenterology  Procedures:   None  Antimicrobials:  None   Subjective: - Pain still present, mildly improved. Poor appetite she hasn't eaten much in the last  24 hours  Objective: Filed Vitals:   07/06/15 0435 07/06/15 1500 07/06/15 2131 07/07/15 0642  BP: 132/70 137/71 149/80 164/93  Pulse: 97 66 84 74  Temp: 98.9 F (37.2 C) 97.9 F (36.6 C) 97.8 F (36.6 C) 97.9 F (36.6 C)  TempSrc: Oral Oral Oral Oral  Resp: 20 16 18 18   Height:      Weight:      SpO2: 96% 98% 94% 98%    Intake/Output Summary (Last 24 hours) at 07/07/15 1153 Last data filed at 07/07/15 0300  Gross per 24 hour  Intake    975 ml  Output      0 ml  Net    975 ml   Filed Weights   07/05/15 1908  Weight: 102.921 kg (226 lb 14.4 oz)    Examination: Constitutional: NAD Filed Vitals:   07/06/15 0435 07/06/15 1500 07/06/15 2131 07/07/15 0642  BP: 132/70 137/71 149/80 164/93  Pulse: 97 66 84 74  Temp: 98.9 F (37.2 C) 97.9 F (36.6 C) 97.8 F (36.6 C) 97.9 F (36.6 C)  TempSrc: Oral Oral Oral Oral  Resp: 20 16 18 18   Height:      Weight:      SpO2: 96% 98% 94% 98%   Eyes: PERRL, No scleral icterus ENMT: Mucous membranes are moist. Posterior pharynx clear of any exudate or lesions.Normal dentition.  Neck: normal, supple, no masses, no thyromegaly Respiratory: clear to auscultation bilaterally, no wheezing, no crackles.  Cardiovascular: Regular rate and rhythm, no murmurs / rubs / gallops. Abdomen: slight epigastric tenderness Bowel sounds positive.  Psychiatric: Normal judgment and insight. Alert and oriented x 3. Normal mood.    Data Reviewed: I have personally reviewed  following labs and imaging studies  CBC:  Recent Labs Lab 07/05/15 1032 07/06/15 0554 07/07/15 0543  WBC 7.0 3.8* 4.9  HGB 14.4 12.0 11.7*  HCT 44.6 37.8 37.2  MCV 89.6 90.4 88.8  PLT 305 225 [id number]   Basic Metabolic Panel:  Recent Labs Lab 07/05/15 1032 07/06/15 0554 07/07/15 0723  NA 137 139 140  K 3.9 5.1 4.5  CL 101 104 106  CO2 28 29 29   GLUCOSE 101* 97 100*  BUN 10 13 10   CREATININE 1.56* 1.48* 1.40*  CALCIUM 9.6 9.3 9.0   GFR: Estimated Creatinine  Clearance: 47.7 mL/min (by C-G formula based on Cr of 1.4). Liver Function Tests:  Recent Labs Lab 07/05/15 1032 07/06/15 0554 07/07/15 0723  AST 89* 314* 163*  ALT 59* 135* 128*  ALKPHOS 146* 173* 189*  BILITOT 0.5 0.5 0.4  PROT 7.8 6.3* 6.2*  ALBUMIN 4.3 3.5 3.5    Recent Labs Lab 07/05/15 1032  LIPASE 22   No results for input(s): AMMONIA in the last 168 hours. Coagulation Profile: No results for input(s): INR, PROTIME in the last 168 hours. Cardiac Enzymes: No results for input(s): CKTOTAL, CKMB, CKMBINDEX, TROPONINI in the last 168 hours. BNP (last 3 results) No results for input(s): PROBNP in the last 8760 hours. HbA1C: No results for input(s): HGBA1C in the last 72 hours. CBG: No results for input(s): GLUCAP in the last 168 hours. Lipid Profile: No results for input(s): CHOL, HDL, LDLCALC, TRIG, CHOLHDL, LDLDIRECT in the last 72 hours. Thyroid Function Tests: No results for input(s): TSH, T4TOTAL, FREET4, T3FREE, THYROIDAB in the last 72 hours. Anemia Panel: No results for input(s): VITAMINB12, FOLATE, FERRITIN, TIBC, IRON, RETICCTPCT in the last 72 hours. Urine analysis:    Component Value Date/Time   COLORURINE YELLOW 07/05/2015 1215   APPEARANCEUR CLEAR 07/05/2015 1215   LABSPEC 1.030 07/05/2015 1215   PHURINE 6.0 07/05/2015 1215   GLUCOSEU NEGATIVE 07/05/2015 1215   HGBUR NEGATIVE 07/05/2015 [place] 07/05/2015 1215   KETONESUR NEGATIVE 07/05/2015 1215   PROTEINUR NEGATIVE 07/05/2015 1215   UROBILINOGEN 0.2 02/24/2013 1030   NITRITE NEGATIVE 07/05/2015 1215   LEUKOCYTESUR NEGATIVE 07/05/2015 1215   Sepsis Labs: Invalid input(s): PROCALCITONIN, LACTICIDVEN  No results found for this or any previous visit (from the past 240 hour(s)).   Radiology Studies: Mr Abdomen Mrcp Wo Cm  07/06/2015  CLINICAL DATA:  History of gastric bypass. Common bile duct dilatation. EXAM: MRI ABDOMEN WITHOUT CONTRAST  (INCLUDING MRCP) TECHNIQUE:  Multiplanar multisequence MR imaging of the abdomen was performed. Heavily T2-weighted images of the biliary and pancreatic ducts were obtained, and three-dimensional MRCP images were rendered by post processing. COMPARISON:  07/05/2015 FINDINGS: Lower chest:  No acute findings. Hepatobiliary: Previous cholecystectomy. Increase caliber of the common bile duct which measures up to 9 mm, image 27 of series 5. No kidney stone or mass identified. The pancreatic duct is normal in caliber. Pancreas: No mass or inflammatory process visualized on this unenhanced exam. Spleen:  Within normal limits in size. Adrenals/Urinary Tract: Normal appearance of the left adrenal gland. Right adrenal adenoma identified. No evidence of renal mass or hydronephrosis. Stomach/Bowel: Postoperative changes of the stomach are identified compatible with previous gastric bypass surgery. No pathologic dilatation of the visualized upper abdominal bowel loops. Vascular/Lymphatic: Calcified atherosclerotic disease involves the abdominal aorta. No aneurysm. No upper abdominal adenopathy. Other:  No free fluid or fluid collections. Musculoskeletal:  No suspicious bone lesions identified. IMPRESSION: 1. Mild increase caliber  of the CBD status post cholecystectomy. No obstructing stone or mass visualized. 2. Right adrenal gland adenoma. Electronically Signed   By: Kerby Moors M.D.   On: 07/06/2015 16:36   Mr 3d Recon At Scanner  07/06/2015  CLINICAL DATA:  History of gastric bypass. Common bile duct dilatation. EXAM: MRI ABDOMEN WITHOUT CONTRAST  (INCLUDING MRCP) TECHNIQUE: Multiplanar multisequence MR imaging of the abdomen was performed. Heavily T2-weighted images of the biliary and pancreatic ducts were obtained, and three-dimensional MRCP images were rendered by post processing. COMPARISON:  07/05/2015 FINDINGS: Lower chest:  No acute findings. Hepatobiliary: Previous cholecystectomy. Increase caliber of the common bile duct which measures up  to 9 mm, image 27 of series 5. No kidney stone or mass identified. The pancreatic duct is normal in caliber. Pancreas: No mass or inflammatory process visualized on this unenhanced exam. Spleen:  Within normal limits in size. Adrenals/Urinary Tract: Normal appearance of the left adrenal gland. Right adrenal adenoma identified. No evidence of renal mass or hydronephrosis. Stomach/Bowel: Postoperative changes of the stomach are identified compatible with previous gastric bypass surgery. No pathologic dilatation of the visualized upper abdominal bowel loops. Vascular/Lymphatic: Calcified atherosclerotic disease involves the abdominal aorta. No aneurysm. No upper abdominal adenopathy. Other:  No free fluid or fluid collections. Musculoskeletal:  No suspicious bone lesions identified. IMPRESSION: 1. Mild increase caliber of the CBD status post cholecystectomy. No obstructing stone or mass visualized. 2. Right adrenal gland adenoma. Electronically Signed   By: Kerby Moors M.D.   On: 07/06/2015 16:36   Scheduled Meds: . aspirin  81 mg Oral Daily  . calcium carbonate  1 tablet Oral Q breakfast  . cholecalciferol  10,000 Units Oral Daily  . enoxaparin (LOVENOX) injection  40 mg Subcutaneous Q24H  . ferrous sulfate  325 mg Oral Q breakfast  . levothyroxine  50 mcg Oral QAC breakfast  . sodium chloride flush  3 mL Intravenous Q12H  . venlafaxine  100 mg Oral TID PC & HS  . vitamin B-12  1,000 mcg Oral Daily   Continuous Infusions: . sodium chloride 75 mL/hr at 07/07/15 0005    Marzetta Board, MD, PhD [facility] Pager [phone]  If 7PM-7AM, please contact night-coverage [email or web address] Password [email or web address] 07/07/2015, 11:53 AM

## 2015-07-08 DIAGNOSIS — R1011 Right upper quadrant pain: Secondary | ICD-10-CM | POA: Diagnosis not present

## 2015-07-08 LAB — COMPREHENSIVE METABOLIC PANEL
ALBUMIN: 3.5 g/dL (ref 3.5–5.0)
ALK PHOS: 216 U/L — AB (ref 38–126)
ALT: 109 U/L — ABNORMAL HIGH (ref 14–54)
ANION GAP: 7 (ref 5–15)
AST: 109 U/L — AB (ref 15–41)
BILIRUBIN TOTAL: 0.3 mg/dL (ref 0.3–1.2)
BUN: 8 mg/dL (ref 6–20)
CO2: 29 mmol/L (ref 22–32)
Calcium: 9.1 mg/dL (ref 8.9–10.3)
Chloride: 108 mmol/L (ref 101–111)
Creatinine, Ser: 1.45 mg/dL — ABNORMAL HIGH (ref 0.44–1.00)
GFR calc Af Amer: 43 mL/min — ABNORMAL LOW (ref >60)
GFR calc non Af Amer: 37 mL/min — ABNORMAL LOW (ref >60)
GLUCOSE: 93 mg/dL (ref 65–99)
POTASSIUM: 4.4 mmol/L (ref 3.5–5.1)
SODIUM: 144 mmol/L (ref 135–145)
Total Protein: 6.2 g/dL — ABNORMAL LOW (ref 6.5–8.1)

## 2015-07-08 MED ORDER — HYDROMORPHONE HCL 1 MG/ML IJ SOLN
1.0000 mg | Freq: Four times a day (QID) | INTRAMUSCULAR | Status: DC | PRN
  Administered 2015-07-08 – 2015-07-09 (×3): 1 mg via INTRAVENOUS
  Filled 2015-07-08 (×4): qty 1

## 2015-07-08 MED ORDER — ALBUTEROL SULFATE (2.5 MG/3ML) 0.083% IN NEBU
2.5000 mg | INHALATION_SOLUTION | RESPIRATORY_TRACT | Status: DC | PRN
  Administered 2015-07-09: 2.5 mg via RESPIRATORY_TRACT
  Filled 2015-07-08: qty 3

## 2015-07-08 MED ORDER — ALBUTEROL SULFATE (2.5 MG/3ML) 0.083% IN NEBU: 2.5000 mg | INHALATION_SOLUTION | RESPIRATORY_TRACT | Status: DC | PRN

## 2015-07-08 MED ORDER — OXYCODONE HCL 5 MG PO TABS
5.0000 mg | ORAL_TABLET | ORAL | Status: DC | PRN
  Administered 2015-07-08 – 2015-07-09 (×5): 10 mg via ORAL
  Filled 2015-07-08 (×5): qty 2

## 2015-07-08 NOTE — Progress Notes (Signed)
PROGRESS NOTE  Lauren Mckenzie [id number] DOB: 1951-01-20 DOA: 07/05/2015 PCP: [name], MD Outpatient Specialists:      Brief Narrative: 65 year old lady with history of gastric bypass in 2003 at [facility], hypertension, admitted on 4/7 with abdominal pain, nausea, found to have dilated common bile duct on CT scan.  Assessment & Plan: Active Problems:   Asthma, chronic   Hypertension   Depression   Anxiety   Hypothyroid   Abdominal pain   RUQ Abdominal pain/? 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 Not of current significance. No evidence of bowel obstruction or perforation. Mildly more prominent dilatation of the bile ducts postcholecystectomy with the CBD measuring 11-12 mm in maximal diameter. - MRCP done 4/8, no evidence of stone or extrinsic compression - LFTs improving, clinically she is improving - discussed with Dr. [name] from gastroenterology, appreciate consult. Continue to monitor patient for an additional 24 hours initially her LFTs are continued trend down, possible discharge tomorrow  Elevated LFTs - Likely secondary to the above - AST and ALT improving, bilirubin remains within normal limits - Continue to monitor  Chronic kidney disease, stage III - Baseline creatinine 1.4-1.5, creatinine stable  Essential hypertension - Lasix held  Hypothyroidism - Continue Synthroid  Depression anxiety - Continue Xanax, Seroquel, Effexor   Asthma - Currently stable, no wheezing - Continue home medications   DVT prophylaxis: Lovenox Code Status: Full code Family Communication: No family at bedside Disposition Plan: Home when ready Barriers for discharge: Monitor LFTs, clinical course  Consultants:   Gastroenterology  Procedures:   None  Antimicrobials:  None   Subjective: - Pain still present, improved. Ate large meal last night and felt nauseous  afterwards  Objective: Filed Vitals:   07/06/15 2131 07/07/15 0642 07/07/15 2104 07/08/15 0406  BP: 149/80 164/93 178/94 164/83  Pulse: 84 74 81 79  Temp: 97.8 F (36.6 C) 97.9 F (36.6 C) 98.2 F (36.8 C) 98.7 F (37.1 C)  TempSrc: Oral Oral Oral Oral  Resp: 18 18 20 20   Height:      Weight:      SpO2: 94% 98% 100% 97%    Intake/Output Summary (Last 24 hours) at 07/08/15 1427 Last data filed at 07/08/15 0335  Gross per 24 hour  Intake 2083.75 ml  Output      0 ml  Net 2083.75 ml   Filed Weights   07/05/15 1908  Weight: 102.921 kg (226 lb 14.4 oz)    Examination: Constitutional: NAD Filed Vitals:   07/06/15 2131 07/07/15 0642 07/07/15 2104 07/08/15 0406  BP: 149/80 164/93 178/94 164/83  Pulse: 84 74 81 79  Temp: 97.8 F (36.6 C) 97.9 F (36.6 C) 98.2 F (36.8 C) 98.7 F (37.1 C)  TempSrc: Oral Oral Oral Oral  Resp: 18 18 20 20   Height:      Weight:      SpO2: 94% 98% 100% 97%   Eyes: PERRL, No scleral icterus ENMT: Mucous membranes are moist.  Respiratory: clear to auscultation bilaterally, no wheezing, no crackles.  Cardiovascular: Regular rate and rhythm, no murmurs / rubs / gallops. Abdomen: slight epigastric tenderness Bowel sounds positive.     Data Reviewed: I have personally reviewed following labs and imaging studies  CBC:  Recent Labs Lab 07/05/15 1032 07/06/15 0554 07/07/15 0543  WBC 7.0 3.8* 4.9  HGB 14.4 12.0 11.7*  HCT 44.6 37.8 37.2  MCV 89.6 90.4 88.8  PLT  305 225 [id number]   Basic Metabolic Panel:  Recent Labs Lab 07/05/15 1032 07/06/15 0554 07/07/15 0723 07/08/15 0524  NA 137 139 140 144  K 3.9 5.1 4.5 4.4  CL 101 104 106 108  CO2 28 29 29 29   GLUCOSE 101* 97 100* 93  BUN 10 13 10 8   CREATININE 1.56* 1.48* 1.40* 1.45*  CALCIUM 9.6 9.3 9.0 9.1   GFR: Estimated Creatinine Clearance: 46 mL/min (by C-G formula based on Cr of 1.45). Liver Function Tests:  Recent Labs Lab 07/05/15 1032 07/06/15 0554 07/07/15 0723  07/08/15 0524  AST 89* 314* 163* 109*  ALT 59* 135* 128* 109*  ALKPHOS 146* 173* 189* 216*  BILITOT 0.5 0.5 0.4 0.3  PROT 7.8 6.3* 6.2* 6.2*  ALBUMIN 4.3 3.5 3.5 3.5    Recent Labs Lab 07/05/15 1032  LIPASE 22   No results for input(s): AMMONIA in the last 168 hours. Coagulation Profile: No results for input(s): INR, PROTIME in the last 168 hours. Cardiac Enzymes: No results for input(s): CKTOTAL, CKMB, CKMBINDEX, TROPONINI in the last 168 hours. BNP (last 3 results) No results for input(s): PROBNP in the last 8760 hours. HbA1C: No results for input(s): HGBA1C in the last 72 hours. CBG: No results for input(s): GLUCAP in the last 168 hours. Lipid Profile: No results for input(s): CHOL, HDL, LDLCALC, TRIG, CHOLHDL, LDLDIRECT in the last 72 hours. Thyroid Function Tests: No results for input(s): TSH, T4TOTAL, FREET4, T3FREE, THYROIDAB in the last 72 hours. Anemia Panel: No results for input(s): VITAMINB12, FOLATE, FERRITIN, TIBC, IRON, RETICCTPCT in the last 72 hours. Urine analysis:    Component Value Date/Time   COLORURINE YELLOW 07/05/2015 1215   APPEARANCEUR CLEAR 07/05/2015 1215   LABSPEC 1.030 07/05/2015 1215   PHURINE 6.0 07/05/2015 1215   GLUCOSEU NEGATIVE 07/05/2015 1215   HGBUR NEGATIVE 07/05/2015 [place] 07/05/2015 1215   KETONESUR NEGATIVE 07/05/2015 1215   PROTEINUR NEGATIVE 07/05/2015 1215   UROBILINOGEN 0.2 02/24/2013 1030   NITRITE NEGATIVE 07/05/2015 1215   LEUKOCYTESUR NEGATIVE 07/05/2015 1215   Sepsis Labs: Invalid input(s): PROCALCITONIN, LACTICIDVEN  No results found for this or any previous visit (from the past 240 hour(s)).   Radiology Studies: No results found. Scheduled Meds: . aspirin  81 mg Oral Daily  . calcium carbonate  1 tablet Oral Q breakfast  . cholecalciferol  10,000 Units Oral Daily  . enoxaparin (LOVENOX) injection  40 mg Subcutaneous Q24H  . ferrous sulfate  325 mg Oral Q breakfast  . levothyroxine   50 mcg Oral QAC breakfast  . sodium chloride flush  3 mL Intravenous Q12H  . venlafaxine  100 mg Oral TID PC & HS  . vitamin B-12  1,000 mcg Oral Daily   Continuous Infusions:    Marzetta Board, MD, PhD [facility] Pager [phone]  If 7PM-7AM, please contact night-coverage [email or web address] Password [email or web address] 07/08/2015, 2:27 PM

## 2015-07-09 ENCOUNTER — Observation Stay (HOSPITAL_COMMUNITY): Payer: Medicare Other

## 2015-07-09 DIAGNOSIS — R740 Nonspecific elevation of levels of transaminase and lactic acid dehydrogenase [LDH]: Secondary | ICD-10-CM | POA: Diagnosis not present

## 2015-07-09 DIAGNOSIS — R1011 Right upper quadrant pain: Secondary | ICD-10-CM | POA: Diagnosis not present

## 2015-07-09 DIAGNOSIS — R933 Abnormal findings on diagnostic imaging of other parts of digestive tract: Secondary | ICD-10-CM | POA: Diagnosis not present

## 2015-07-09 DIAGNOSIS — R05 Cough: Secondary | ICD-10-CM | POA: Diagnosis not present

## 2015-07-09 DIAGNOSIS — R0602 Shortness of breath: Secondary | ICD-10-CM | POA: Diagnosis not present

## 2015-07-09 LAB — HEPATIC FUNCTION PANEL
ALBUMIN: 3.6 g/dL (ref 3.5–5.0)
ALT: 85 U/L — AB (ref 14–54)
AST: 56 U/L — AB (ref 15–41)
Alkaline Phosphatase: 212 U/L — ABNORMAL HIGH (ref 38–126)
Bilirubin, Direct: <0.1 mg/dL — ABNORMAL LOW (ref 0.1–0.5)
TOTAL PROTEIN: 6.4 g/dL — AB (ref 6.5–8.1)
Total Bilirubin: 0.3 mg/dL (ref 0.3–1.2)

## 2015-07-09 MED ORDER — ONDANSETRON HCL 4 MG PO TABS: 4.0000 mg | ORAL_TABLET | Freq: Three times a day (TID) | ORAL | Status: DC | PRN

## 2015-07-09 MED ORDER — GUAIFENESIN-DM 100-10 MG/5ML PO SYRP: 5.0000 mL | ORAL_SOLUTION | ORAL | Status: DC | PRN

## 2015-07-09 MED ORDER — HYDROMORPHONE HCL 2 MG PO TABS: 1.0000 mg | ORAL_TABLET | Freq: Four times a day (QID) | ORAL | Status: DC | PRN

## 2015-07-09 NOTE — Discharge Summary (Signed)
Physician Discharge Summary  Lauren Mckenzie [id number] DOB: 02-Feb-1951 DOA: 07/05/2015  PCP: Lauren Richards, MD  Admit date: 07/05/2015 Discharge date: 07/09/2015  Time spent: > 30 minutes  Recommendations for Outpatient Follow-up:  1. Follow up with PCP as needed 2. Follow up with Dr. Benson Mckenzie in 2 weeks   Discharge Diagnoses:  Active Problems:   Asthma, chronic   Hypertension   Depression   Anxiety   Hypothyroid   Abdominal pain  Discharge Condition: stable  Diet recommendation: bariatric  Filed Weights   07/05/15 1908  Weight: 102.921 kg (226 lb 14.4 oz)    History of present illness:  See H&P, Labs, Consult and Test reports for all details in brief, patient is a 65 year old lady with history of gastric bypass in 2003 at [facility], hypertension, admitted on 4/7 with abdominal pain, nausea, found to have dilated common bile duct on CT scan.  Hospital Course:  RUQ Abdominal pain/? 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 Not of current significance. No evidence of bowel obstruction or perforation. Mildly more prominent dilatation of the bile ducts postcholecystectomy with the CBD measuring 11-12 mm in maximal diameter. MRCP done 4/8, no evidence of stone or extrinsic compression, LFTs improving, clinically she is improving suggesting perhaps a passed stone. Discussed with Dr. Benson Mckenzie from gastroenterology, appreciate consult, she is stable for d/c with close outpatient follow up.  Elevated LFTs - Likely secondary to the above Chronic kidney disease, stage III - Baseline creatinine 1.4-1.5, creatinine stable Essential hypertension  Hypothyroidism - Continue Synthroid Depression anxiety - Continue Xanax, Seroquel, Effexor  Asthma - Currently stable, no wheezing  Procedures:  MRCP   Consultations:  GI  Discharge Exam: Filed Vitals:   07/08/15 2141 07/09/15 0506 07/09/15 1225  07/09/15 1406  BP: 161/88 168/80  137/85  Pulse: 81 85  81  Temp: 98.3 F (36.8 C) 98.3 F (36.8 C)  98 F (36.7 C)  TempSrc: Oral Oral  Oral  Resp: 20 18  18   Height:      Weight:      SpO2: 96% 95% 98% 93%    General: NAD Cardiovascular: RRR Respiratory: CTA biL  Discharge Instructions Activity:  As tolerated   Get Medicines reviewed and adjusted: Please take all your medications with you for your next visit with your Primary MD  Please request your Primary MD to go over all hospital tests and procedure/radiological results at the follow up, please ask your Primary MD to get all Hospital records sent to his/her office.  If you experience worsening of your admission symptoms, develop shortness of breath, life threatening emergency, suicidal or homicidal thoughts you must seek medical attention immediately by calling 911 or calling your MD immediately if symptoms less severe.  You must read complete instructions/literature along with all the possible adverse reactions/side effects for all the Medicines you take and that have been prescribed to you. Take any new Medicines after you have completely understood and accpet all the possible adverse reactions/side effects.   Do not drive when taking Pain medications.   Do not take more than prescribed Pain, Sleep and Anxiety Medications  Special Instructions: If you have smoked or chewed Tobacco in the last 2 yrs please stop smoking, stop any regular Alcohol and or any Recreational drug use.  Wear Seat belts while driving.  Please note  You were cared for by a hospitalist during your hospital stay. Once you are  discharged, your primary care physician will handle any further medical issues. Please note that NO REFILLS for any discharge medications will be authorized once you are discharged, as it is imperative that you return to your primary care physician (or establish a relationship with a primary care physician if you do not  have one) for your aftercare needs so that they can reassess your need for medications and monitor your lab values.    Medication List    STOP taking these medications        traMADol 50 MG tablet  Commonly known as:  ULTRAM      TAKE these medications        acetaminophen 500 MG tablet  Commonly known as:  TYLENOL  Take 1,000 mg by mouth every 6 (six) hours as needed for moderate pain or headache.     albuterol 108 (90 Base) MCG/ACT inhaler  Commonly known as:  PROAIR HFA  INHALE TWO PUFFS BY MOUTH 4 TIMES DAILY TO EVERY 4 HOURS     ALPRAZolam 1 MG tablet  Commonly known as:  XANAX  Take 1 mg by mouth 3 (three) times daily as needed for anxiety.     aspirin 81 MG EC tablet  Take 81 mg by mouth daily.     calcium carbonate 600 MG Tabs tablet  Commonly known as:  OS-CAL  Take 600 mg by mouth daily.     CINNAMON PO  Take 1,000 mg by mouth 3 (three) times daily.     diclofenac 75 MG EC tablet  Commonly known as:  VOLTAREN  Take 1 tablet (75 mg total) by mouth 2 (two) times daily.     EPINEPHrine 0.3 mg/0.3 mL Soaj injection  Commonly known as:  EPI-PEN  Inject 0.3 mLs (0.3 mg total) into the muscle once.     ferrous sulfate 325 (65 FE) MG tablet  Take 325 mg by mouth daily with breakfast.     furosemide 40 MG tablet  Commonly known as:  LASIX  TAKE 1 TABLET BY MOUTH TWICE A DAY     gabapentin 300 MG capsule  Commonly known as:  NEURONTIN  TAKE 1 CAPSULE (300 MG TOTAL) BY MOUTH 4 (FOUR) TIMES DAILY.     GAS-X PO  Take 2 tablets by mouth daily as needed (gas).     guaiFENesin-dextromethorphan 100-10 MG/5ML syrup  Commonly known as:  ROBITUSSIN DM  Take 5 mLs by mouth every 4 (four) hours as needed for cough.     HYDROmorphone 2 MG tablet  Commonly known as:  DILAUDID  Take 0.5-1 tablets (1-2 mg total) by mouth every 6 (six) hours as needed for severe pain.     ipratropium-albuterol 0.5-2.5 (3) MG/3ML Soln  Commonly known as:  DUONEB  USE 1 VIAL IN  NEBULIZER 4 TIMES A DAY     levothyroxine 50 MCG tablet  Commonly known as:  SYNTHROID, LEVOTHROID  TAKE 1 TABLET BY MOUTH ONCE DAILY     MAGNESIUM PO  Take 1 tablet by mouth 2 (two) times daily.     meloxicam 15 MG tablet  Commonly known as:  MOBIC  Take 1 tablet (15 mg total) by mouth daily.     ondansetron 4 MG tablet  Commonly known as:  ZOFRAN  Take 1 tablet (4 mg total) by mouth every 8 (eight) hours as needed for nausea or vomiting.     QUEtiapine 400 MG tablet  Commonly known as:  SEROQUEL  Take 400 mg by  mouth at bedtime as needed (sleep).     traZODone 100 MG tablet  Commonly known as:  DESYREL  TAKE 1/2 TO 2 TABLETS AT BEDTIME     venlafaxine 100 MG tablet  Commonly known as:  EFFEXOR  Take 100 mg by mouth 4 (four) times daily - after meals and at bedtime.     VITAMIN B-12 PO  Take 1 tablet by mouth daily.     B-12 2500 MCG Tabs  Take 2,500 mcg by mouth daily.     Vitamin D3 5000 units Caps  Take 10,000 Units by mouth daily.           Follow-up Information    Follow up with Lauren Richards, MD.   Specialty:  Internal Medicine   Why:  As needed   Contact information:   [address] [place] [address] [phone]       Follow up with Beryle Beams, MD In 2 weeks.   Specialty:  Gastroenterology   Contact information:   [address] [place] [address] [phone]       The results of significant diagnostics from this hospitalization (including imaging, microbiology, ancillary and laboratory) are listed below for reference.    Significant Diagnostic Studies: Dg Chest 2 View  07/09/2015  CLINICAL DATA:  Cough, wheezing and shortness of breath. EXAM: CHEST  2 VIEW COMPARISON:  07/01/2011. FINDINGS: Normal sized heart. Clear lungs. Mild thoracic spine degenerative changes. IMPRESSION: No acute abnormality. Electronically Signed   By: Claudie Revering M.D.   On: 07/09/2015 11:42   Ct Abdomen Pelvis W  Contrast  07/05/2015  CLINICAL DATA:  Upper abdominal pain with nausea. History of gastric bypass. EXAM: CT ABDOMEN AND PELVIS WITH CONTRAST TECHNIQUE: Multidetector CT imaging of the abdomen and pelvis was performed using the standard protocol following bolus administration of intravenous contrast. CONTRAST:  66mL ISOVUE-300 IOPAMIDOL (ISOVUE-300) INJECTION 61% COMPARISON:  09/30/2010 FINDINGS: Lower chest:  Visualized lung bases are unremarkable. Hepatobiliary: The liver has a normal appearance. The gallbladder has been removed. Mildly more prominent dilatation of the common bile duct and intrahepatic ducts post cholecystectomy with the CBD measuring up to 11-12 mm and maximal caliber. No evidence by CT of choledocholithiasis. This still remains likely biliary dilatation on the basis of prior cholecystectomy. Pancreas: Stable appearance of pancreas. No evidence of pancreatic mass. Spleen: Within normal limits in size and appearance. Adrenals/Urinary Tract: Right adrenal nodule again identified measuring 1.3 x 1.8 cm. This remains likely a stable adenoma. The kidneys have a normal appearance bilaterally without evidence of mass or hydronephrosis. No calculi are identified. The bladder is unremarkable. Stomach/Bowel: At the level of Roux-en-Y gastric bypass, oral contrast is seen in the isolated stomach as well as the distal native stomach and duodenum. There also is some oral contrast that has pass through into the jejunum. 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 This can lead to recurrent weight gain. True delineation of gastric emptying would require real-time imaging with upper GI series. There is no evidence to suggest perforation or obstruction. No free air is identified. Vascular/Lymphatic: No enlarged lymph nodes. No abnormal fluid collections. Reproductive: The uterus and adnexal regions appear unremarkable by CT. Other: None. Musculoskeletal:  Bony structures are  unremarkable. IMPRESSION: 1. 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 This is not of  acute significance but can lead to recurrent weight gain. A formal upper GI series would be necessary to fully delineate the nature of gastric outflow. There is no evidence of bowel obstruction or perforation. 2. Mildly more prominent dilatation of the bile ducts post cholecystectomy with the CBD measuring 11-12 mm in maximal diameter. This still remains likely secondary to prior cholecystectomy with no visualized calculi in the duct or obstructing mass identified by CT. Electronically Signed   By: Aletta Edouard M.D.   On: 07/05/2015 12:12   Mr Abdomen Mrcp Wo Cm  07/06/2015  CLINICAL DATA:  History of gastric bypass. Common bile duct dilatation. EXAM: MRI ABDOMEN WITHOUT CONTRAST  (INCLUDING MRCP) TECHNIQUE: Multiplanar multisequence MR imaging of the abdomen was performed. Heavily T2-weighted images of the biliary and pancreatic ducts were obtained, and three-dimensional MRCP images were rendered by post processing. COMPARISON:  07/05/2015 FINDINGS: Lower chest:  No acute findings. Hepatobiliary: Previous cholecystectomy. Increase caliber of the common bile duct which measures up to 9 mm, image 27 of series 5. No kidney stone or mass identified. The pancreatic duct is normal in caliber. Pancreas: No mass or inflammatory process visualized on this unenhanced exam. Spleen:  Within normal limits in size. Adrenals/Urinary Tract: Normal appearance of the left adrenal gland. Right adrenal adenoma identified. No evidence of renal mass or hydronephrosis. Stomach/Bowel: Postoperative changes of the stomach are identified compatible with previous gastric bypass surgery. No pathologic dilatation of the visualized upper abdominal bowel loops. Vascular/Lymphatic: Calcified atherosclerotic disease  involves the abdominal aorta. No aneurysm. No upper abdominal adenopathy. Other:  No free fluid or fluid collections. Musculoskeletal:  No suspicious bone lesions identified. IMPRESSION: 1. Mild increase caliber of the CBD status post cholecystectomy. No obstructing stone or mass visualized. 2. Right adrenal gland adenoma. Electronically Signed   By: Kerby Moors M.D.   On: 07/06/2015 16:36   Mr 3d Recon At Scanner  07/06/2015  CLINICAL DATA:  History of gastric bypass. Common bile duct dilatation. EXAM: MRI ABDOMEN WITHOUT CONTRAST  (INCLUDING MRCP) TECHNIQUE: Multiplanar multisequence MR imaging of the abdomen was performed. Heavily T2-weighted images of the biliary and pancreatic ducts were obtained, and three-dimensional MRCP images were rendered by post processing. COMPARISON:  07/05/2015 FINDINGS: Lower chest:  No acute findings. Hepatobiliary: Previous cholecystectomy. Increase caliber of the common bile duct which measures up to 9 mm, image 27 of series 5. No kidney stone or mass identified. The pancreatic duct is normal in caliber. Pancreas: No mass or inflammatory process visualized on this unenhanced exam. Spleen:  Within normal limits in size. Adrenals/Urinary Tract: Normal appearance of the left adrenal gland. Right adrenal adenoma identified. No evidence of renal mass or hydronephrosis. Stomach/Bowel: Postoperative changes of the stomach are identified compatible with previous gastric bypass surgery. No pathologic dilatation of the visualized upper abdominal bowel loops. Vascular/Lymphatic: Calcified atherosclerotic disease involves the abdominal aorta. No aneurysm. No upper abdominal adenopathy. Other:  No free fluid or fluid collections. Musculoskeletal:  No suspicious bone lesions identified. IMPRESSION: 1. Mild increase caliber of the CBD status post cholecystectomy. No obstructing stone or mass visualized. 2. Right adrenal gland adenoma. Electronically Signed   By: Kerby Moors M.D.   On:  07/06/2015 16:36   Labs: Basic Metabolic Panel:  Recent Labs Lab 07/05/15 1032 07/06/15 0554 07/07/15 0723 07/08/15 0524  NA 137 139 140 144  K 3.9 5.1 4.5 4.4  CL 101 104 106 108  CO2 28 29 29 29   GLUCOSE 101* 97 100* 93  BUN 10 13 10 8   CREATININE 1.56* 1.48* 1.40* 1.45*  CALCIUM 9.6 9.3 9.0 9.1   Liver Function Tests:  Recent Labs Lab 07/05/15 1032 07/06/15 0554 07/07/15 0723 07/08/15 0524 07/09/15 0529  AST 89* 314* 163* 109* 56*  ALT 59* 135* 128* 109* 85*  ALKPHOS 146* 173* 189* 216* 212*  BILITOT 0.5 0.5 0.4 0.3 0.3  PROT 7.8 6.3* 6.2* 6.2* 6.4*  ALBUMIN 4.3 3.5 3.5 3.5 3.6    Recent Labs Lab 07/05/15 1032  LIPASE 22   CBC:  Recent Labs Lab 07/05/15 1032 07/06/15 0554 07/07/15 0543  WBC 7.0 3.8* 4.9  HGB 14.4 12.0 11.7*  HCT 44.6 37.8 37.2  MCV 89.6 90.4 88.8  PLT 305 225 210    Signed:  Kortni Hasten  [facility] 07/09/2015, 4:24 PM

## 2015-07-09 NOTE — Progress Notes (Signed)
I reviewed the patient's chart in detail and discussed my impressions with the patient.  Overall she feels better and she is able to tolerate liquids without any reproduction of her RUQ pain and nausea symptoms.  The MRCP was negative for any stones, but I have a suspicion that she may still have a retained stone.  Her AP continues to increase and in the past it was normal.  Her other liver enzymes are dropping down as it is suggestive that she passed a stone, which I hope is the case.  AP typically declines with the other liver enzymes and the bilirubin is the last to resolve.  Her TB was normal during this visit.  If she needs an ERCP, the procedure cannot be performed here with the standard equipment, however, an intraoperative approach can be pursued.  This will require the surgeons to open access to the remnant stomach.  The best noninvasive approach can be achieved by our colleagues at [facility] or [facility].  I do not know if [facility] has the ability, I presume that they do offer this service.  I think she can be discharged home and I will have her follow up in the office in 2 weeks.  Hopefully her AP is the only parameter that is lagging and her stone has passed.

## 2015-07-09 NOTE — Discharge Instructions (Signed)
Follow with [name], MD as needed  Please get a complete blood count and chemistry panel checked by your Primary MD at your next visit, and again as instructed by your Primary MD. Please get your medications reviewed and adjusted by your Primary MD.  Please request your Primary MD to go over all Hospital Tests and Procedure/Radiological results at the follow up, please get all Hospital records sent to your Prim MD by signing hospital release before you go home.  If you had Pneumonia of Lung problems at the Hospital: Please get a 2 view Chest X ray done in 6-8 weeks after hospital discharge or sooner if instructed by your Primary MD.  If you have Congestive Heart Failure: Please call your Cardiologist or Primary MD anytime you have any of the following symptoms:  1) 3 pound weight gain in 24 hours or 5 pounds in 1 week  2) shortness of breath, with or without a dry hacking cough  3) swelling in the hands, feet or stomach  4) if you have to sleep on extra pillows at night in order to breathe  Follow cardiac low salt diet and 1.5 lit/day fluid restriction.  If you have diabetes Accuchecks 4 times/day, Once in AM empty stomach and then before each meal. Log in all results and show them to your primary doctor at your next visit. If any glucose reading is under 80 or above 300 call your primary MD immediately.  If you have Seizure/Convulsions/Epilepsy: Please do not drive, operate heavy machinery, participate in activities at heights or participate in high speed sports until you have seen by Primary MD or a Neurologist and advised to do so again.  If you had Gastrointestinal Bleeding: Please ask your Primary MD to check a complete blood count within one week of discharge or at your next visit. Your endoscopic/colonoscopic biopsies that are pending at the time of discharge, will also need to followed by your Primary MD.  Get Medicines reviewed and adjusted. Please take all your  medications with you for your next visit with your Primary MD  Please request your Primary MD to go over all hospital tests and procedure/radiological results at the follow up, please ask your Primary MD to get all Hospital records sent to his/her office.  If you experience worsening of your admission symptoms, develop shortness of breath, life threatening emergency, suicidal or homicidal thoughts you must seek medical attention immediately by calling 911 or calling your MD immediately  if symptoms less severe.  You must read complete instructions/literature along with all the possible adverse reactions/side effects for all the Medicines you take and that have been prescribed to you. Take any new Medicines after you have completely understood and accpet all the possible adverse reactions/side effects.   Do not drive or operate heavy machinery when taking Pain medications.   Do not take more than prescribed Pain, Sleep and Anxiety Medications  Special Instructions: If you have smoked or chewed Tobacco  in the last 2 yrs please stop smoking, stop any regular Alcohol  and or any Recreational drug use.  Wear Seat belts while driving.  Please note You were cared for by a hospitalist during your hospital stay. If you have any questions about your discharge medications or the care you received while you were in the hospital after you are discharged, you can call the unit and asked to speak with the hospitalist on call if the hospitalist that took care of you is not available. Once you  are discharged, your primary care physician will handle any further medical issues. Please note that NO REFILLS for any discharge medications will be authorized once you are discharged, as it is imperative that you return to your primary care physician (or establish a relationship with a primary care physician if you do not have one) for your aftercare needs so that they can reassess your need for medications and monitor your  lab values.  You can reach the hospitalist office at phone [phone] or fax [phone]   If you do not have a primary care physician, you can call [phone] for a physician referral.  Activity: As tolerated with Full fall precautions use walker/cane & assistance as needed  Diet: bariatric  Disposition Home

## 2015-07-10 ENCOUNTER — Ambulatory Visit: Payer: Self-pay

## 2015-07-10 ENCOUNTER — Other Ambulatory Visit: Payer: Self-pay | Admitting: *Deleted

## 2015-07-10 ENCOUNTER — Telehealth: Payer: Self-pay | Admitting: *Deleted

## 2015-07-10 DIAGNOSIS — Z7689 Persons encountering health services in other specified circumstances: Secondary | ICD-10-CM | POA: Diagnosis not present

## 2015-07-10 DIAGNOSIS — J45909 Unspecified asthma, uncomplicated: Secondary | ICD-10-CM

## 2015-07-10 MED ORDER — ALBUTEROL SULFATE HFA 108 (90 BASE) MCG/ACT IN AERS: INHALATION_SPRAY | RESPIRATORY_TRACT | Status: AC

## 2015-07-10 NOTE — Telephone Encounter (Signed)
Pt request refill of Tramadol.  I had left a message telling pt I would see if I could refill the Tramadol.  I reviewed pt's medication orders and Dr. Milinda Pointer had not ordered Tramadol, I left message for pt to make an appt.

## 2015-07-11 ENCOUNTER — Telehealth: Payer: Self-pay | Admitting: *Deleted

## 2015-07-11 MED ORDER — TRAMADOL HCL 50 MG PO TABS: 50.0000 mg | ORAL_TABLET | Freq: Three times a day (TID) | ORAL | Status: DC | PRN

## 2015-07-11 NOTE — Telephone Encounter (Signed)
Pt called states Dr. Milinda Pointer had called in the to the [facility].  I reviewed phone calls and pt was prescribed Tramadol on 06/14/2015 and I refilled and called in as previously, okayed by Dr. Paulla Dolly.

## 2015-07-22 DIAGNOSIS — R740 Nonspecific elevation of levels of transaminase and lactic acid dehydrogenase [LDH]: Secondary | ICD-10-CM | POA: Diagnosis not present

## 2015-08-06 ENCOUNTER — Other Ambulatory Visit: Payer: Self-pay | Admitting: Internal Medicine

## 2015-08-29 DIAGNOSIS — R740 Nonspecific elevation of levels of transaminase and lactic acid dehydrogenase [LDH]: Secondary | ICD-10-CM | POA: Diagnosis not present

## 2015-08-29 DIAGNOSIS — R112 Nausea with vomiting, unspecified: Secondary | ICD-10-CM | POA: Diagnosis not present

## 2015-08-29 DIAGNOSIS — R1011 Right upper quadrant pain: Secondary | ICD-10-CM | POA: Diagnosis not present

## 2015-09-04 ENCOUNTER — Ambulatory Visit (INDEPENDENT_AMBULATORY_CARE_PROVIDER_SITE_OTHER): Payer: Medicare Other | Admitting: Neurology

## 2015-09-04 ENCOUNTER — Encounter: Payer: Self-pay | Admitting: Neurology

## 2015-09-04 VITALS — BP 164/105 | HR 98 | Resp 20 | Wt 218.5 lb

## 2015-09-04 DIAGNOSIS — R251 Tremor, unspecified: Secondary | ICD-10-CM

## 2015-09-04 NOTE — Progress Notes (Addendum)
Reason for visit: Tremor  Referring physician: Dr. Cassell Clement is a 65 y.o. female  History of present illness:  Lauren Mckenzie is a 65 year old left-handed white female with a history of onset of a tremor affecting primarily the left arm about 4 or 5 months ago. The patient indicates that the tremor oftentimes is much worse in the morning, she may take alprazolam and the tremor will be suppressed later during the day. She indicates that the tremor mainly bothers her when she is trying to perform handwriting or when she is trying to feed herself. The patient denies any head or jaw tremor and she denies any vocal tremor. She denies a family history of tremor, she has no siblings. She indicates that the tremor is present when she is doing something with her hand, not when the hand is resting. She has not noted changes in walking or with her balance, she does have some baseline slight imbalance issues, no recent falls. The patient may have some dizziness with standing. She denies any weakness of the extremities or changes in bowel or bladder control. She has been sick recently with gallbladder and gallstone issues. She is sent to this office for an evaluation.  Past Medical History  Diagnosis Date  . S/P gastric bypass   . Hypokalemia   . Anxiety and depression   . Abdominal pain      status post gastric bypass for bariatric surgical   management.  . Hyperlipidemia   . Hypertension   . Depression   . Anemia   . Anxiety   . Hypothyroid   . Prediabetes     Past Surgical History  Procedure Laterality Date  . Gastric bypass    . Cholecystectomy    . Tubal ligation    . Cervical fusion      C6-7    Family History  Problem Relation Age of Onset  . Heart attack Mother   . Heart attack Father     Social history:  reports that she has never smoked. She has never used smokeless tobacco. She reports that she does not drink alcohol or use illicit drugs.  Medications:  Prior  to Admission medications   Medication Sig Start Date End Date Taking? Authorizing Provider  acetaminophen (TYLENOL) 500 MG tablet Take 1,000 mg by mouth every 6 (six) hours as needed for moderate pain or headache.   Yes Historical Provider, MD  albuterol (PROAIR HFA) 108 (90 Base) MCG/ACT inhaler INHALE TWO PUFFS BY MOUTH 4 TIMES DAILY TO EVERY 4 HOURS 07/10/15  Yes Unk Pinto, MD  ALPRAZolam Duanne Moron) 1 MG tablet Take 1 mg by mouth 3 (three) times daily as needed for anxiety.    Yes Historical Provider, MD  aspirin 81 MG EC tablet Take 81 mg by mouth daily.     Yes Historical Provider, MD  calcium carbonate (OS-CAL) 600 MG TABS Take 600 mg by mouth daily.     Yes Historical Provider, MD  Cholecalciferol (VITAMIN D3) 5000 units CAPS Take 10,000 Units by mouth daily.   Yes Historical Provider, MD  CINNAMON PO Take 1,000 mg by mouth 3 (three) times daily.   Yes Historical Provider, MD  Cyanocobalamin (B-12) 2500 MCG TABS Take 2,500 mcg by mouth daily.   Yes Historical Provider, MD  Cyanocobalamin (VITAMIN B-12 PO) Take 1 tablet by mouth daily.   Yes Historical Provider, MD  EPINEPHrine (EPI-PEN) 0.3 mg/0.3 mL SOAJ injection Inject 0.3 mLs (0.3 mg total) into  the muscle once. 07/17/13  Yes Melissa Smith, PA-C  ferrous sulfate 325 (65 FE) MG tablet Take 325 mg by mouth daily with breakfast.    Yes Historical Provider, MD  furosemide (LASIX) 40 MG tablet TAKE 1 TABLET BY MOUTH TWICE A DAY Patient taking differently: take 40 mgs daily as needed for swelling   Yes Vicie Mutters, PA-C  gabapentin (NEURONTIN) 300 MG capsule TAKE 1 CAPSULE (300 MG TOTAL) BY MOUTH 4 (FOUR) TIMES DAILY. Patient taking differently: TAKE 1 CAPSULE (300 MG TOTAL) BY MOUTH 4 (FOUR) TIMES DAILY as needed for restless legs 05/14/15  Yes Unk Pinto, MD  guaiFENesin-dextromethorphan [facility] DM) 100-10 MG/5ML syrup Take 5 mLs by mouth every 4 (four) hours as needed for cough. 07/09/15  Yes Costin Karlyne Greenspan, MD    ipratropium-albuterol (DUONEB) 0.5-2.5 (3) MG/3ML SOLN USE 1 VIAL IN NEBULIZER 4 TIMES A DAY Patient taking differently: USE 1 VIAL IN NEBULIZER 4 TIMES A DAY as needed for shortness of breath   Yes Melissa Smith, PA-C  levothyroxine (SYNTHROID, LEVOTHROID) 50 MCG tablet TAKE 1 TABLET BY MOUTH ONCE DAILY 08/06/15  Yes Unk Pinto, MD  MAGNESIUM PO Take 1 tablet by mouth 2 (two) times daily.   Yes Historical Provider, MD  meloxicam (MOBIC) 15 MG tablet Take 1 tablet (15 mg total) by mouth daily. 04/23/15  Yes Max T Hyatt, DPM  traZODone (DESYREL) 100 MG tablet TAKE 1/2 TO 2 TABLETS AT BEDTIME 05/10/15  Yes Historical Provider, MD  venlafaxine (EFFEXOR) 100 MG tablet Take 100 mg by mouth 4 (four) times daily - after meals and at bedtime.    Yes Historical Provider, MD  diclofenac (VOLTAREN) 75 MG EC tablet Take 1 tablet (75 mg total) by mouth 2 (two) times daily. Patient not taking: Reported on 09/04/2015 06/06/15   Max T Hyatt, DPM  HYDROmorphone (DILAUDID) 2 MG tablet Take 0.5-1 tablets (1-2 mg total) by mouth every 6 (six) hours as needed for severe pain. Patient not taking: Reported on 09/04/2015 07/09/15   Caren Griffins, MD  ondansetron (ZOFRAN) 4 MG tablet Take 1 tablet (4 mg total) by mouth every 8 (eight) hours as needed for nausea or vomiting. Patient not taking: Reported on 09/04/2015 07/09/15   Caren Griffins, MD  QUEtiapine (SEROQUEL) 400 MG tablet Take 400 mg by mouth at bedtime as needed (sleep). Reported on 09/04/2015 05/29/15   Historical Provider, MD  Simethicone (GAS-X PO) Take 2 tablets by mouth daily as needed (gas). Reported on 09/04/2015    Historical Provider, MD  traMADol (ULTRAM) 50 MG tablet Take 1 tablet (50 mg total) by mouth every 8 (eight) hours as needed. Patient not taking: Reported on 09/04/2015 07/11/15   Max T Milinda Pointer, DPM      Allergies  Allergen Reactions  . Shellfish Allergy Anaphylaxis  . Theophylline Anaphylaxis    ROS:  Out of a complete 14 system review of  symptoms, the patient complains only of the following symptoms, and all other reviewed systems are negative.  Fatigue Feeling hot Memory loss, dizziness, tremor Depression, anxiety, not enough sleep, decreased energy, disinterest in activities, racing thoughts Sleepiness, restless legs  Blood pressure 164/105, pulse 98, resp. rate 20, weight 218 lb 8 oz (99.111 kg).  Physical Exam  General: The patient is alert and cooperative at the time of the examination. The patient is markedly obese.  Eyes: Pupils are equal, round, and reactive to light. Discs are flat bilaterally.  Neck: The neck is supple, no carotid bruits  are noted.  Respiratory: The respiratory examination is clear.  Cardiovascular: The cardiovascular examination reveals a regular rate and rhythm, no obvious murmurs or rubs are noted.  Skin: Extremities are without significant edema.  Neurologic Exam  Mental status: The patient is alert and oriented x 3 at the time of the examination. The patient has apparent normal recent and remote memory, with an apparently normal attention span and concentration ability.  Cranial nerves: Facial symmetry is present. There is good sensation of the face to pinprick and soft touch bilaterally. The strength of the facial muscles and the muscles to head turning and shoulder shrug are normal bilaterally. Speech is well enunciated, no aphasia or dysarthria is noted. Extraocular movements are full. Visual fields are full. The tongue is midline, and the patient has symmetric elevation of the soft palate. No obvious hearing deficits are noted.  Motor: The motor testing reveals 5 over 5 strength of all 4 extremities. Good symmetric motor tone is noted throughout.  Sensory: Sensory testing is intact to pinprick, soft touch, vibration sensation, and position sense on all 4 extremities. No evidence of extinction is noted.  Coordination: Cerebellar testing reveals good finger-nose-finger and  heel-to-shin bilaterally. No tremor is noted on clinical examination today. She is able to draw a spiral without evidence of tremor in the handwriting.  Gait and station: Gait is normal. Tandem gait is slightly unsteady. Romberg is negative. No drift is seen.  Reflexes: Deep tendon reflexes are symmetric and normal bilaterally. Toes are downgoing bilaterally.   Assessment/Plan:  1. Tremor, likely essential tremor  The patient has a relatively unremarkable neurologic examination today. No tremor is seen on the exam, but the patient reports tremors with handwriting and feeding himself consistent with an essential tremor. The tremors are asymmetric which can occur with essential tremors. We will check MRI of the brain. Xanax appears to be effective for treating the tremor, she can take a tablet or a half of a tablet when she needs it for the tremor. She will follow-up through this office if needed. If the tremor worsens, we can add a daily medication to reduce the tremor severity.  Addendum: The patient called back indicating that she did not wish to have a MRI of the brain. The order will be canceled.  Jill Alexanders MD 09/04/2015 5:55 PM  [facility] [address] [place], [place] [address]  Phone [phone] Fax [phone]

## 2015-09-07 ENCOUNTER — Other Ambulatory Visit: Payer: Self-pay | Admitting: Internal Medicine

## 2015-09-11 ENCOUNTER — Ambulatory Visit: Payer: Self-pay | Admitting: Internal Medicine

## 2015-10-09 ENCOUNTER — Ambulatory Visit: Payer: Self-pay | Admitting: Physician Assistant

## 2015-10-15 ENCOUNTER — Encounter: Payer: Self-pay | Admitting: Physician Assistant

## 2015-10-15 ENCOUNTER — Ambulatory Visit (INDEPENDENT_AMBULATORY_CARE_PROVIDER_SITE_OTHER): Payer: Medicare Other | Admitting: Physician Assistant

## 2015-10-15 VITALS — BP 130/80 | HR 108 | Temp 97.3°F | Resp 14 | Ht 63.5 in | Wt 212.6 lb

## 2015-10-15 DIAGNOSIS — F419 Anxiety disorder, unspecified: Secondary | ICD-10-CM | POA: Diagnosis not present

## 2015-10-15 DIAGNOSIS — J45909 Unspecified asthma, uncomplicated: Secondary | ICD-10-CM

## 2015-10-15 DIAGNOSIS — R6889 Other general symptoms and signs: Secondary | ICD-10-CM

## 2015-10-15 DIAGNOSIS — E785 Hyperlipidemia, unspecified: Secondary | ICD-10-CM | POA: Diagnosis not present

## 2015-10-15 DIAGNOSIS — R7303 Prediabetes: Secondary | ICD-10-CM

## 2015-10-15 DIAGNOSIS — Z136 Encounter for screening for cardiovascular disorders: Secondary | ICD-10-CM

## 2015-10-15 DIAGNOSIS — F325 Major depressive disorder, single episode, in full remission: Secondary | ICD-10-CM | POA: Insufficient documentation

## 2015-10-15 DIAGNOSIS — R5383 Other fatigue: Secondary | ICD-10-CM

## 2015-10-15 DIAGNOSIS — R251 Tremor, unspecified: Secondary | ICD-10-CM | POA: Diagnosis not present

## 2015-10-15 DIAGNOSIS — D3130 Benign neoplasm of unspecified choroid: Secondary | ICD-10-CM

## 2015-10-15 DIAGNOSIS — Z79899 Other long term (current) drug therapy: Secondary | ICD-10-CM

## 2015-10-15 DIAGNOSIS — E559 Vitamin D deficiency, unspecified: Secondary | ICD-10-CM | POA: Diagnosis not present

## 2015-10-15 DIAGNOSIS — D649 Anemia, unspecified: Secondary | ICD-10-CM

## 2015-10-15 DIAGNOSIS — H25819 Combined forms of age-related cataract, unspecified eye: Secondary | ICD-10-CM

## 2015-10-15 DIAGNOSIS — G2581 Restless legs syndrome: Secondary | ICD-10-CM

## 2015-10-15 DIAGNOSIS — E039 Hypothyroidism, unspecified: Secondary | ICD-10-CM

## 2015-10-15 DIAGNOSIS — Z0001 Encounter for general adult medical examination with abnormal findings: Secondary | ICD-10-CM | POA: Diagnosis not present

## 2015-10-15 DIAGNOSIS — L239 Allergic contact dermatitis, unspecified cause: Secondary | ICD-10-CM

## 2015-10-15 DIAGNOSIS — B372 Candidiasis of skin and nail: Secondary | ICD-10-CM

## 2015-10-15 DIAGNOSIS — I10 Essential (primary) hypertension: Secondary | ICD-10-CM | POA: Diagnosis not present

## 2015-10-15 LAB — CBC WITH DIFFERENTIAL/PLATELET
BASOS ABS: 0 {cells}/uL (ref 0–200)
Basophils Relative: 0 %
EOS ABS: 140 {cells}/uL (ref 15–500)
EOS PCT: 1 %
HCT: 48.9 % — ABNORMAL HIGH (ref 35.0–45.0)
HEMOGLOBIN: 15.6 g/dL — AB (ref 11.7–15.5)
LYMPHS ABS: 3220 {cells}/uL (ref 850–3900)
Lymphocytes Relative: 23 %
MCH: 27.8 pg (ref 27.0–33.0)
MCHC: 31.9 g/dL — AB (ref 32.0–36.0)
MCV: 87 fL (ref 80.0–100.0)
MPV: 9.5 fL (ref 7.5–12.5)
Monocytes Absolute: 840 cells/uL (ref 200–950)
Monocytes Relative: 6 %
NEUTROS PCT: 70 %
Neutro Abs: 9800 cells/uL — ABNORMAL HIGH (ref 1500–7800)
Platelets: 434 10*3/uL — ABNORMAL HIGH (ref 140–400)
RBC: 5.62 MIL/uL — ABNORMAL HIGH (ref 3.80–5.10)
RDW: 14.5 % (ref 11.0–15.0)
WBC: 14 10*3/uL — ABNORMAL HIGH (ref 3.8–10.8)

## 2015-10-15 LAB — TSH: TSH: 2.72 m[IU]/L

## 2015-10-15 MED ORDER — GABAPENTIN 300 MG PO CAPS: 600.0000 mg | ORAL_CAPSULE | Freq: Three times a day (TID) | ORAL | Status: AC

## 2015-10-15 MED ORDER — ROPINIROLE HCL 0.5 MG PO TABS
0.5000 mg | ORAL_TABLET | Freq: Three times a day (TID) | ORAL | Status: AC
Start: 2015-10-15 — End: 2016-10-14

## 2015-10-15 MED ORDER — TRIAMCINOLONE ACETONIDE 0.5 % EX OINT: TOPICAL_OINTMENT | CUTANEOUS | Status: DC

## 2015-10-15 MED ORDER — LIDOCAINE VISCOUS 2 % MT SOLN: OROMUCOSAL | Status: DC

## 2015-10-15 NOTE — Patient Instructions (Signed)
  Requip 0.25mg - start this dose about 1-2 hours BEFORE restless symptoms start.  The dose may be increased by 0.25 mg every two to three days until relief is obtained. Most patients require at least 2 mg, and doses up to 4 mg may be needed. Common adverse side effects: usually mild, transient, and limited to nausea, lightheadedness, and fatigue; these usually resolve within 10 to 14 days. Less frequent side effects include nasal stuffiness, constipation, and leg edema; these are reversible if the medication is stopped.   Restless Legs Syndrome Restless legs syndrome is a movement disorder. It may also be called a sensorimotor disorder.  CAUSES  No one knows what specifically causes restless legs syndrome, but it tends to run in families. It is also more common in people with low iron, in pregnancy, in people who need dialysis, and those with nerve damage (neuropathy).Some medications may make restless legs syndrome worse.Those medications include drugs to treat high blood pressure, some heart conditions, nausea, colds, allergies, and depression. SYMPTOMS Symptoms include uncomfortable sensations in the legs. These leg sensations are worse during periods of inactivity or rest. They are also worse while sitting or lying down. Individuals that have the disorder describe sensations in the legs that feel like:  Pulling.  Drawing.  Crawling.  Worming.  Boring.  Tingling.  Pins and needles.  Prickling.  Pain. The sensations are usually accompanied by an overwhelming urge to move the legs. Sudden muscle jerks may also occur. Movement provides temporary relief from the discomfort. In rare cases, the arms may also be affected. Symptoms may interfere with going to sleep (sleep onset insomnia). Restless legs syndrome may also be related to periodic limb movement disorder (PLMD). PLMD is another more common motor disorder. It also causes interrupted sleep. The symptoms from PLMD usually occur most  often when you are awake. TREATMENT  Treatment for restless legs syndrome is symptomatic. This means that the symptoms are treated.   Massage and cold compresses may provide temporary relief.  Walk, stretch, or take a cold or hot bath.  Get regular exercise and a good night's sleep.  Avoid caffeine, alcohol, nicotine, and medications that can make it worse.  Do activities that provide mental stimulation like discussions, needlework, and video games. These may be helpful if you are not able to walk or stretch. Some medications are effective in relieving the symptoms. However, many of these medications have side effects. Ask your caregiver about medications that may help your symptoms. Correcting iron deficiency may improve symptoms for some patients. Document Released: 03/06/2002 Document Revised: 07/31/2013 Document Reviewed: 06/12/2010 [facility] Patient Information 2015 [place], [place]. This information is not intended to replace advice given to you by your health care provider. Make sure you discuss any questions you have with your health care provider.

## 2015-10-15 NOTE — Progress Notes (Signed)
CPE AND FOLLOW UP  Assessment:   1. Essential hypertension - continue medications, DASH diet, exercise and monitor at home. Call if greater than 130/80.  - CBC with Differential/Platelet - BASIC METABOLIC PANEL WITH GFR - Hepatic function panel - Urinalysis, Routine w reflex microscopic (not at [facility]) - Microalbumin / creatinine urine ratio - EKG 12-Lead  2. Hypothyroidism, unspecified hypothyroidism type Hypothyroidism-check TSH level, continue medications the same, reminded to take on an empty stomach 30-64mns before food.  - TSH  3. Hyperlipidemia -continue medications, check lipids, decrease fatty foods, increase activity.  - Lipid panel  4. Prediabetes Discussed general issues about diabetes pathophysiology and management., Educational material distributed., Suggested low cholesterol diet., Encouraged aerobic exercise., Discussed foot care., Reminded to get yearly retinal exam. - Hemoglobin A1c  5. Morbid obesity, unspecified obesity type ([place] Obesity with co morbidities- long discussion about weight loss, diet, and exercise  6. Vitamin D deficiency - VITAMIN D 25 Hydroxy (Vit-D Deficiency, Fractures)  7. Medication management - Magnesium  8. Asthma, chronic, unspecified asthma severity, uncomplicated Given symbicort inhaler, start daily to see if this helps with SOB/dyspnea Weight loss advised Albuterol PRN  9. Depression, major, in remission [place] Continue meds, follow up Dr. KToy Care 10. Anxiety Follow up Dr. KToy Care 11. Anemia, unspecified anemia type Monitor CBC  12. 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 Combined form of senile cataract, unspecified laterality Continue to follow up  15. Encounter for general adult medical examination with abnormal findings  16. 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 Other fatigue Check labs, try symbicort, check EKG, try requip for sleep    Subjective:   Lauren HARCUMis a 65y.o. female who presents for CPE and  3 month follow up on hypertension, prediabetes, hyperlipidemia, vitamin D def.   Her blood pressure has been controlled at home, today their BP is BP: 130/80 mmHg She does workout. She denies chest pain, shortness of breath, dizziness.  Has been following Dr. WJannifer Franklinfor tremor.  She has been following with Dr. HBenson Norwayfor elevated alk phos, had normal MRCP, and is being followed outpatient.  Her only complaint is decreased energy since before her hospitalization. She continues to have SOB with walking up hills, chasing after her dog with extreme fatigue. She had normal Echo 2012 and normal lexiscan. She states she is off seroquuel, not sleeping anymore due to restless legs. Has had negative sleep apnea test. Not on symbicort daily She is not on cholesterol medication and denies myalgias. Her cholesterol is at goal. The cholesterol last visit was:   Lab Results  Component Value Date   CHOL 182 06/05/2015   HDL 49 06/05/2015   LDLCALC 102 06/05/2015   TRIG 157* 06/05/2015   CHOLHDL 3.7 06/05/2015   She has been working on diet and exercise for prediabetes, and denies paresthesia of the feet, polydipsia and polyuria. Last A1C in the office was:  Lab Results  Component Value Date   HGBA1C 5.3 06/05/2015   Patient is on Vitamin D supplement. Lab Results  Component Value Date   VD25OH 63 06/05/2015  On disability due to depression since 2005, follows with Dr. KToy Care  She has a rash and would like a prescritpion She is on  Gabapentin for RLS and states it is helping, she is taking 1-2 at night.  She is on thyroid medication. Her medication was not changed last visit. Patient denies nervousness, palpitations and weight changes.  Lab Results  Component Value Date   TSH  2.09 06/05/2015   BMI is Body mass index is 37.07 kg/(m^2)., she is working on diet and exercise.She had a negative sleep study.  Wt Readings from Last 3 Encounters:  10/15/15 212 lb 9.6 oz (96.435 kg)  09/04/15 218 lb 8 oz (99.111 kg)  07/05/15 226 lb 14.4 oz (102.921 kg)    Names of Other Physician/Practitioners you currently use: 1. [facility]- here for primary care 2. Dr. Katy Fitch eye doctor, last visit recent 3. [facility]. Cordelia Pen at [facility]- eye specialist- ? Ocular melanolma being watched 3. None dentist, last visit unknown Patient Care Team: Unk Pinto, MD as PCP - General (Internal Medicine) Carol Ada, MD as Consulting Physician (Gastroenterology) Larey Dresser, MD as Consulting Physician (Cardiology) Gentry Fitz, MD as Consulting Physician (Family Medicine) Chucky May, MD as Consulting Physician (Psychiatry) Deneise Lever, MD as Consulting Physician (Pulmonary Disease) Chucky May, MD as Consulting Physician (Psychiatry)  Medication Review Current Outpatient Prescriptions on File Prior to Visit  Medication Sig Dispense Refill  . albuterol (PROAIR HFA) 108 (90 Base) MCG/ACT inhaler INHALE TWO PUFFS BY MOUTH 4 TIMES DAILY TO EVERY 4 HOURS 1 each 11  . ALPRAZolam (XANAX) 1 MG tablet Take 1 mg by mouth 3 (three) times daily as needed for anxiety.     Marland Kitchen aspirin 81 MG EC tablet Take 81 mg by mouth daily.      . calcium carbonate (OS-CAL) 600 MG TABS Take 600 mg by mouth daily.      . Cholecalciferol (VITAMIN D3) 5000 units CAPS Take 10,000 Units by mouth daily.    Marland Kitchen CINNAMON PO Take 1,000 mg by mouth 3 (three) times daily.    . Cyanocobalamin (VITAMIN B-12 PO) Take 1 tablet by mouth daily.    Marland Kitchen EPINEPHrine (EPI-PEN) 0.3 mg/0.3 mL SOAJ injection Inject 0.3 mLs (0.3 mg total) into the muscle once. 2 Device 1  . ferrous sulfate 325 (65 FE) MG tablet Take 325 mg by mouth daily with breakfast.     . furosemide (LASIX) 40 MG  tablet TAKE 1 TABLET BY MOUTH TWICE A DAY (Patient taking differently: take 40 mgs daily as needed for swelling) 60 tablet 3  . gabapentin (NEURONTIN) 300 MG capsule TAKE 1 CAPSULE (300 MG TOTAL) BY MOUTH 4 (FOUR) TIMES DAILY. 120 capsule 3  . ipratropium-albuterol (DUONEB) 0.5-2.5 (3) MG/3ML SOLN USE 1 VIAL IN NEBULIZER 4 TIMES A DAY (Patient taking differently: USE 1 VIAL IN NEBULIZER 4 TIMES A DAY as needed for shortness of breath) 180 mL 1  . levothyroxine (SYNTHROID, LEVOTHROID) 50 MCG tablet TAKE 1 TABLET BY MOUTH ONCE DAILY 90 tablet 0  . MAGNESIUM PO Take 1 tablet by mouth 2 (two) times daily.    . meloxicam (MOBIC) 15 MG tablet Take 1 tablet (15 mg total) by mouth daily. 30 tablet 3  . QUEtiapine (SEROQUEL) 400 MG tablet Take 400 mg by mouth at bedtime as needed (sleep). Reported on 09/04/2015  12  . Simethicone (GAS-X PO) Take 2 tablets by mouth daily as needed (gas). Reported on 09/04/2015    . traZODone (DESYREL) 100 MG tablet TAKE 1/2 TO 2 TABLETS AT BEDTIME  12  . venlafaxine (EFFEXOR) 100 MG tablet Take 100  mg by mouth 4 (four) times daily - after meals and at bedtime.      No current facility-administered medications on file prior to visit.    Current Problems (verified) Patient Active Problem List   Diagnosis Date Noted  . Tremor 09/04/2015  . Abdominal pain 07/05/2015  . Vitamin D deficiency 03/19/2014  . Medication management 03/19/2014  . Morbid obesity (BMI 38.53) 03/19/2014  . 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 Combined form of senile cataract 09/28/2013  . Hypertension   . Depression   . Anemia   . Anxiety   . Hypothyroid   . Prediabetes   . Dyspnea on exertion 07/07/2010  . Hyperlipidemia 07/07/2010  . Asthma, chronic 03/17/2007    Screening Tests Immunization History  Administered Date(s) Administered  . DT 10/18/2013  . Influenza Split 01/04/2013  . Pneumococcal Conjugate-13 06/06/2015  . Pneumococcal Polysaccharide-23 12/23/2011    Preventative  care: Last colonoscopy: 2012 due 10 years.  Last mammogram: 03/2015  Last pap smear/pelvic exam: remote DEXA:12/2011 due 2015 Oct Echo/stress test 06/2010 PFTs 2012  Prior vaccinations TD or Tdap: 2015  Influenza: declines Pneumococcal: 2013 Prevnar 13: 2017 Shingles/Zostavax: declines  Allergies Allergies  Allergen Reactions  . Shellfish Allergy Anaphylaxis  . Theophylline Anaphylaxis    SURGICAL HISTORY She  has past surgical history that includes Gastric bypass; Cholecystectomy; Tubal ligation; and Cervical fusion. FAMILY HISTORY Her family history includes Heart attack in her father and mother. SOCIAL HISTORY She  reports that she has never smoked. She has never used smokeless tobacco. She reports that she does not drink alcohol or use illicit drugs.  Review of Systems  Constitutional: Positive for malaise/fatigue. Negative for fever, chills, weight loss and diaphoresis.  HENT: Negative.   Respiratory: Positive for shortness of breath. Negative for cough, hemoptysis, sputum production and wheezing.   Cardiovascular: Negative.   Gastrointestinal: Negative.   Genitourinary: Negative.   Musculoskeletal: Positive for myalgias and back pain. Negative for joint pain, falls and neck pain.  Skin: Positive for rash. Negative for itching.  Neurological: Negative.  Negative for weakness.  Psychiatric/Behavioral: Negative.      Objective:   Blood pressure 130/80, pulse 108, temperature 97.3 F (36.3 C), temperature source Temporal, resp. rate 14, height 5' 3.5" (1.613 m), weight 212 lb 9.6 oz (96.435 kg), SpO2 97 %. Body mass index is 37.07 kg/(m^2).  General appearance: alert, no distress, WD/WN,  female HEENT: normocephalic, sclerae anicteric, TMs pearly, nares patent, no discharge or erythema, pharynx normal Oral cavity: MMM, no lesions Neck: supple, no lymphadenopathy, no thyromegaly, no masses Heart: RRR, normal S1, S2, no murmurs Lungs: CTA bilaterally, no wheezes,  rhonchi, or rales Abdomen: +bs, soft, non tender, non distended, no masses, no hepatomegaly, no splenomegaly Musculoskeletal: nontender, no swelling, no obvious deformity Extremities: no edema, no cyanosis, no clubbing Pulses: 2+ symmetric, upper and lower extremities, normal cap refill Neurological: alert, oriented x 3, CN2-12 intact, strength normal upper extremities and lower extremities, sensation normal throughout, DTRs 2+ throughout, no cerebellar signs, gait normal Psychiatric: normal affect, behavior normal, pleasant  Breast: defer Gyn: defer Rectal: defer  EKG PRWP, no ST changes.

## 2015-10-16 ENCOUNTER — Encounter: Payer: Self-pay | Admitting: Physician Assistant

## 2015-10-16 ENCOUNTER — Other Ambulatory Visit: Payer: Self-pay | Admitting: Physician Assistant

## 2015-10-16 LAB — HEPATIC FUNCTION PANEL
ALBUMIN: 4.6 g/dL (ref 3.6–5.1)
ALT: 12 U/L (ref 6–29)
AST: 15 U/L (ref 10–35)
Alkaline Phosphatase: 127 U/L (ref 33–130)
BILIRUBIN DIRECT: 0.1 mg/dL (ref <=0.2)
Indirect Bilirubin: 0.2 mg/dL (ref 0.2–1.2)
TOTAL PROTEIN: 7.4 g/dL (ref 6.1–8.1)
Total Bilirubin: 0.3 mg/dL (ref 0.2–1.2)

## 2015-10-16 LAB — LIPID PANEL
CHOL/HDL RATIO: 3.5 ratio (ref <=5.0)
CHOLESTEROL: 214 mg/dL — AB (ref 125–200)
HDL: 62 mg/dL (ref >=46)
LDL Cholesterol: 119 mg/dL (ref <130)
Triglycerides: 164 mg/dL — ABNORMAL HIGH (ref <150)
VLDL: 33 mg/dL — ABNORMAL HIGH (ref <30)

## 2015-10-16 LAB — BASIC METABOLIC PANEL WITH GFR
BUN: 14 mg/dL (ref 7–25)
CALCIUM: 10.7 mg/dL — AB (ref 8.6–10.4)
CHLORIDE: 101 mmol/L (ref 98–110)
CO2: 26 mmol/L (ref 20–31)
CREATININE: 1.98 mg/dL — AB (ref 0.50–0.99)
GFR, Est African American: 30 mL/min — ABNORMAL LOW (ref >=60)
GFR, Est Non African American: 26 mL/min — ABNORMAL LOW (ref >=60)
GLUCOSE: 93 mg/dL (ref 65–99)
Potassium: 5.2 mmol/L (ref 3.5–5.3)
SODIUM: 143 mmol/L (ref 135–146)

## 2015-10-16 LAB — URINALYSIS, MICROSCOPIC ONLY
BACTERIA UA: NONE SEEN [HPF]
CASTS: NONE SEEN [LPF]
Crystals: NONE SEEN [HPF]
RBC / HPF: NONE SEEN RBC/HPF (ref <=2)
Yeast: NONE SEEN [HPF]

## 2015-10-16 LAB — URINALYSIS, ROUTINE W REFLEX MICROSCOPIC
BILIRUBIN URINE: NEGATIVE
Glucose, UA: NEGATIVE
HGB URINE DIPSTICK: NEGATIVE
KETONES UR: NEGATIVE
NITRITE: NEGATIVE
Protein, ur: NEGATIVE
SPECIFIC GRAVITY, URINE: 1.014 (ref 1.001–1.035)
pH: 5 (ref 5.0–8.0)

## 2015-10-16 LAB — HEMOGLOBIN A1C
HEMOGLOBIN A1C: 5 % (ref <5.7)
MEAN PLASMA GLUCOSE: 97 mg/dL

## 2015-10-16 LAB — VITAMIN D 25 HYDROXY (VIT D DEFICIENCY, FRACTURES): Vit D, 25-Hydroxy: 87 ng/mL (ref 30–100)

## 2015-10-16 LAB — MICROALBUMIN / CREATININE URINE RATIO
CREATININE, URINE: 52 mg/dL (ref 20–320)
MICROALB UR: 1.4 mg/dL
Microalb Creat Ratio: 27 mcg/mg creat (ref <30)

## 2015-10-16 LAB — MAGNESIUM: MAGNESIUM: 2.2 mg/dL (ref 1.5–2.5)

## 2015-10-16 MED ORDER — LIDOCAINE 4 % EX CREA: 1.0000 "application " | TOPICAL_CREAM | CUTANEOUS | Status: DC | PRN

## 2015-11-04 ENCOUNTER — Other Ambulatory Visit: Payer: Self-pay | Admitting: Internal Medicine

## 2015-11-12 DIAGNOSIS — R740 Nonspecific elevation of levels of transaminase and lactic acid dehydrogenase [LDH]: Secondary | ICD-10-CM | POA: Diagnosis not present

## 2015-11-13 ENCOUNTER — Other Ambulatory Visit: Payer: Self-pay | Admitting: Physician Assistant

## 2015-11-20 ENCOUNTER — Ambulatory Visit: Payer: Self-pay

## 2015-11-22 ENCOUNTER — Other Ambulatory Visit: Payer: Self-pay | Admitting: Gastroenterology

## 2015-11-22 DIAGNOSIS — R1084 Generalized abdominal pain: Secondary | ICD-10-CM

## 2015-11-27 ENCOUNTER — Ambulatory Visit: Payer: Self-pay

## 2015-12-05 ENCOUNTER — Other Ambulatory Visit: Payer: Self-pay

## 2015-12-05 ENCOUNTER — Ambulatory Visit: Payer: Self-pay

## 2015-12-06 ENCOUNTER — Other Ambulatory Visit: Payer: Self-pay | Admitting: Gastroenterology

## 2015-12-06 ENCOUNTER — Ambulatory Visit
Admission: RE | Admit: 2015-12-06 | Discharge: 2015-12-06 | Disposition: A | Discharge status: Home / Self Care | Payer: Medicare Other | Source: Ambulatory Visit | Attending: Gastroenterology | Admitting: Gastroenterology

## 2015-12-06 ENCOUNTER — Ambulatory Visit (INDEPENDENT_AMBULATORY_CARE_PROVIDER_SITE_OTHER): Payer: Medicare Other | Admitting: *Deleted

## 2015-12-06 DIAGNOSIS — R1084 Generalized abdominal pain: Secondary | ICD-10-CM

## 2015-12-06 DIAGNOSIS — K224 Dyskinesia of esophagus: Secondary | ICD-10-CM | POA: Diagnosis not present

## 2015-12-06 DIAGNOSIS — R1013 Epigastric pain: Secondary | ICD-10-CM | POA: Diagnosis not present

## 2015-12-06 DIAGNOSIS — Z79899 Other long term (current) drug therapy: Secondary | ICD-10-CM

## 2015-12-06 LAB — CBC WITH DIFFERENTIAL/PLATELET
BASOS ABS: 0 {cells}/uL (ref 0–200)
Basophils Relative: 0 %
EOS PCT: 2 %
Eosinophils Absolute: 236 cells/uL (ref 15–500)
HCT: 39.5 % (ref 35.0–45.0)
Hemoglobin: 12.6 g/dL (ref 11.7–15.5)
LYMPHS PCT: 25 %
Lymphs Abs: 2950 cells/uL (ref 850–3900)
MCH: 27.3 pg (ref 27.0–33.0)
MCHC: 31.9 g/dL — AB (ref 32.0–36.0)
MCV: 85.5 fL (ref 80.0–100.0)
MONOS PCT: 5 %
MPV: 9.1 fL (ref 7.5–12.5)
Monocytes Absolute: 590 cells/uL (ref 200–950)
NEUTROS ABS: 8024 {cells}/uL — AB (ref 1500–7800)
NEUTROS PCT: 68 %
PLATELETS: 411 10*3/uL — AB (ref 140–400)
RBC: 4.62 MIL/uL (ref 3.80–5.10)
RDW: 13.1 % (ref 11.0–15.0)
WBC: 11.8 10*3/uL — ABNORMAL HIGH (ref 3.8–10.8)

## 2015-12-06 LAB — BASIC METABOLIC PANEL WITH GFR
BUN: 23 mg/dL (ref 7–25)
CHLORIDE: 95 mmol/L — AB (ref 98–110)
CO2: 28 mmol/L (ref 20–31)
CREATININE: 2.42 mg/dL — AB (ref 0.50–0.99)
Calcium: 10.1 mg/dL (ref 8.6–10.4)
GFR, Est African American: 23 mL/min — ABNORMAL LOW (ref >=60)
GFR, Est Non African American: 20 mL/min — ABNORMAL LOW (ref >=60)
Glucose, Bld: 107 mg/dL — ABNORMAL HIGH (ref 65–99)
POTASSIUM: 5.2 mmol/L (ref 3.5–5.3)
Sodium: 137 mmol/L (ref 135–146)

## 2015-12-08 ENCOUNTER — Inpatient Hospital Stay (HOSPITAL_COMMUNITY): Payer: Medicare Other

## 2015-12-08 ENCOUNTER — Encounter (HOSPITAL_COMMUNITY): Admission: EM | Disposition: E | Payer: Self-pay | Source: Home / Self Care | Attending: Pulmonary Disease

## 2015-12-08 ENCOUNTER — Emergency Department (HOSPITAL_COMMUNITY): Payer: Medicare Other

## 2015-12-08 ENCOUNTER — Inpatient Hospital Stay (HOSPITAL_COMMUNITY)
Admission: EM | Admit: 2015-12-08 | Discharge: 2015-12-29 | DRG: 356 | Disposition: E | Payer: Medicare Other | Attending: Pulmonary Disease | Admitting: Pulmonary Disease

## 2015-12-08 ENCOUNTER — Inpatient Hospital Stay (HOSPITAL_COMMUNITY): Payer: Medicare Other | Admitting: Certified Registered"

## 2015-12-08 ENCOUNTER — Encounter (HOSPITAL_COMMUNITY): Payer: Self-pay | Admitting: Emergency Medicine

## 2015-12-08 DIAGNOSIS — E669 Obesity, unspecified: Secondary | ICD-10-CM | POA: Diagnosis present

## 2015-12-08 DIAGNOSIS — K254 Chronic or unspecified gastric ulcer with hemorrhage: Secondary | ICD-10-CM | POA: Diagnosis not present

## 2015-12-08 DIAGNOSIS — Z66 Do not resuscitate: Secondary | ICD-10-CM | POA: Diagnosis not present

## 2015-12-08 DIAGNOSIS — J9690 Respiratory failure, unspecified, unspecified whether with hypoxia or hypercapnia: Secondary | ICD-10-CM | POA: Diagnosis not present

## 2015-12-08 DIAGNOSIS — J9600 Acute respiratory failure, unspecified whether with hypoxia or hypercapnia: Secondary | ICD-10-CM | POA: Diagnosis not present

## 2015-12-08 DIAGNOSIS — R578 Other shock: Secondary | ICD-10-CM | POA: Diagnosis not present

## 2015-12-08 DIAGNOSIS — K264 Chronic or unspecified duodenal ulcer with hemorrhage: Secondary | ICD-10-CM | POA: Diagnosis not present

## 2015-12-08 DIAGNOSIS — G9340 Encephalopathy, unspecified: Secondary | ICD-10-CM | POA: Diagnosis not present

## 2015-12-08 DIAGNOSIS — E875 Hyperkalemia: Secondary | ICD-10-CM | POA: Diagnosis not present

## 2015-12-08 DIAGNOSIS — D72820 Lymphocytosis (symptomatic): Secondary | ICD-10-CM | POA: Diagnosis not present

## 2015-12-08 DIAGNOSIS — E785 Hyperlipidemia, unspecified: Secondary | ICD-10-CM | POA: Diagnosis not present

## 2015-12-08 DIAGNOSIS — R739 Hyperglycemia, unspecified: Secondary | ICD-10-CM | POA: Diagnosis not present

## 2015-12-08 DIAGNOSIS — N189 Chronic kidney disease, unspecified: Secondary | ICD-10-CM

## 2015-12-08 DIAGNOSIS — G8929 Other chronic pain: Secondary | ICD-10-CM | POA: Diagnosis not present

## 2015-12-08 DIAGNOSIS — R579 Shock, unspecified: Secondary | ICD-10-CM

## 2015-12-08 DIAGNOSIS — F419 Anxiety disorder, unspecified: Secondary | ICD-10-CM | POA: Diagnosis present

## 2015-12-08 DIAGNOSIS — Z9884 Bariatric surgery status: Secondary | ICD-10-CM

## 2015-12-08 DIAGNOSIS — R410 Disorientation, unspecified: Secondary | ICD-10-CM | POA: Diagnosis not present

## 2015-12-08 DIAGNOSIS — D500 Iron deficiency anemia secondary to blood loss (chronic): Secondary | ICD-10-CM | POA: Diagnosis not present

## 2015-12-08 DIAGNOSIS — I959 Hypotension, unspecified: Secondary | ICD-10-CM | POA: Diagnosis not present

## 2015-12-08 DIAGNOSIS — N179 Acute kidney failure, unspecified: Secondary | ICD-10-CM | POA: Diagnosis not present

## 2015-12-08 DIAGNOSIS — Z91013 Allergy to seafood: Secondary | ICD-10-CM

## 2015-12-08 DIAGNOSIS — E039 Hypothyroidism, unspecified: Secondary | ICD-10-CM | POA: Diagnosis not present

## 2015-12-08 DIAGNOSIS — J9601 Acute respiratory failure with hypoxia: Secondary | ICD-10-CM | POA: Diagnosis not present

## 2015-12-08 DIAGNOSIS — D62 Acute posthemorrhagic anemia: Secondary | ICD-10-CM | POA: Diagnosis present

## 2015-12-08 DIAGNOSIS — J45909 Unspecified asthma, uncomplicated: Secondary | ICD-10-CM | POA: Diagnosis not present

## 2015-12-08 DIAGNOSIS — K9189 Other postprocedural complications and disorders of digestive system: Secondary | ICD-10-CM | POA: Diagnosis not present

## 2015-12-08 DIAGNOSIS — Z01818 Encounter for other preprocedural examination: Secondary | ICD-10-CM

## 2015-12-08 DIAGNOSIS — Z6838 Body mass index (BMI) 38.0-38.9, adult: Secondary | ICD-10-CM | POA: Diagnosis not present

## 2015-12-08 DIAGNOSIS — I129 Hypertensive chronic kidney disease with stage 1 through stage 4 chronic kidney disease, or unspecified chronic kidney disease: Secondary | ICD-10-CM | POA: Diagnosis not present

## 2015-12-08 DIAGNOSIS — Z981 Arthrodesis status: Secondary | ICD-10-CM

## 2015-12-08 DIAGNOSIS — Z888 Allergy status to other drugs, medicaments and biological substances status: Secondary | ICD-10-CM

## 2015-12-08 DIAGNOSIS — Z9049 Acquired absence of other specified parts of digestive tract: Secondary | ICD-10-CM

## 2015-12-08 DIAGNOSIS — K5909 Other constipation: Secondary | ICD-10-CM | POA: Diagnosis present

## 2015-12-08 DIAGNOSIS — K2970 Gastritis, unspecified, without bleeding: Secondary | ICD-10-CM | POA: Diagnosis not present

## 2015-12-08 DIAGNOSIS — E872 Acidosis: Secondary | ICD-10-CM | POA: Diagnosis present

## 2015-12-08 DIAGNOSIS — R918 Other nonspecific abnormal finding of lung field: Secondary | ICD-10-CM | POA: Diagnosis not present

## 2015-12-08 DIAGNOSIS — Z79899 Other long term (current) drug therapy: Secondary | ICD-10-CM

## 2015-12-08 DIAGNOSIS — Z9289 Personal history of other medical treatment: Secondary | ICD-10-CM

## 2015-12-08 DIAGNOSIS — K922 Gastrointestinal hemorrhage, unspecified: Secondary | ICD-10-CM | POA: Diagnosis not present

## 2015-12-08 DIAGNOSIS — Z7189 Other specified counseling: Secondary | ICD-10-CM

## 2015-12-08 DIAGNOSIS — Z7982 Long term (current) use of aspirin: Secondary | ICD-10-CM

## 2015-12-08 DIAGNOSIS — K920 Hematemesis: Secondary | ICD-10-CM | POA: Diagnosis not present

## 2015-12-08 HISTORY — PX: IR GENERIC HISTORICAL: IMG1180011

## 2015-12-08 HISTORY — PX: ESOPHAGOGASTRODUODENOSCOPY: SHX5428

## 2015-12-08 LAB — POCT I-STAT, CHEM 8
BUN: 30 mg/dL — ABNORMAL HIGH (ref 6–20)
CALCIUM ION: 1.09 mmol/L — AB (ref 1.15–1.40)
CHLORIDE: 105 mmol/L (ref 101–111)
Creatinine, Ser: 1.7 mg/dL — ABNORMAL HIGH (ref 0.44–1.00)
GLUCOSE: 164 mg/dL — AB (ref 65–99)
HCT: 29 % — ABNORMAL LOW (ref 36.0–46.0)
Hemoglobin: 9.9 g/dL — ABNORMAL LOW (ref 12.0–15.0)
Potassium: 4.9 mmol/L (ref 3.5–5.1)
Sodium: 139 mmol/L (ref 135–145)
TCO2: 23 mmol/L (ref 0–100)

## 2015-12-08 LAB — COMPREHENSIVE METABOLIC PANEL
ALK PHOS: 92 U/L (ref 38–126)
ALT: 7 U/L — AB (ref 14–54)
AST: 18 U/L (ref 15–41)
Albumin: 2.6 g/dL — ABNORMAL LOW (ref 3.5–5.0)
Anion gap: 13 (ref 5–15)
BILIRUBIN TOTAL: 0.3 mg/dL (ref 0.3–1.2)
BUN: 24 mg/dL — ABNORMAL HIGH (ref 6–20)
CALCIUM: 9.2 mg/dL (ref 8.9–10.3)
CO2: 24 mmol/L (ref 22–32)
CREATININE: 2.14 mg/dL — AB (ref 0.44–1.00)
Chloride: 100 mmol/L — ABNORMAL LOW (ref 101–111)
GFR, EST AFRICAN AMERICAN: 27 mL/min — AB (ref >60)
GFR, EST NON AFRICAN AMERICAN: 23 mL/min — AB (ref >60)
Glucose, Bld: 249 mg/dL — ABNORMAL HIGH (ref 65–99)
Potassium: 4.4 mmol/L (ref 3.5–5.1)
SODIUM: 137 mmol/L (ref 135–145)
TOTAL PROTEIN: 5.4 g/dL — AB (ref 6.5–8.1)

## 2015-12-08 LAB — CBC
HCT: 30.2 % — ABNORMAL LOW (ref 36.0–46.0)
HEMATOCRIT: 34.8 % — AB (ref 36.0–46.0)
Hemoglobin: 11.1 g/dL — ABNORMAL LOW (ref 12.0–15.0)
Hemoglobin: 9.6 g/dL — ABNORMAL LOW (ref 12.0–15.0)
MCH: 27.8 pg (ref 26.0–34.0)
MCH: 27.7 pg (ref 26.0–34.0)
MCHC: 31.9 g/dL (ref 30.0–36.0)
MCHC: 31.8 g/dL (ref 30.0–36.0)
MCV: 87 fL (ref 78.0–100.0)
MCV: 87 fL (ref 78.0–100.0)
PLATELETS: 316 10*3/uL (ref 150–400)
PLATELETS: 424 10*3/uL — AB (ref 150–400)
RBC: 4 MIL/uL (ref 3.87–5.11)
RBC: 3.47 MIL/uL — AB (ref 3.87–5.11)
RDW: 14.4 % (ref 11.5–15.5)
RDW: 14.6 % (ref 11.5–15.5)
WBC: 14.4 10*3/uL — AB (ref 4.0–10.5)
WBC: 21.5 10*3/uL — ABNORMAL HIGH (ref 4.0–10.5)

## 2015-12-08 LAB — I-STAT CG4 LACTIC ACID, ED: LACTIC ACID, VENOUS: 5.91 mmol/L — AB (ref 0.5–1.9)

## 2015-12-08 LAB — BASIC METABOLIC PANEL
ANION GAP: 6 (ref 5–15)
Anion gap: 7 (ref 5–15)
BUN: 28 mg/dL — AB (ref 6–20)
BUN: 27 mg/dL — ABNORMAL HIGH (ref 6–20)
CHLORIDE: 106 mmol/L (ref 101–111)
CHLORIDE: 110 mmol/L (ref 101–111)
CO2: 25 mmol/L (ref 22–32)
CO2: 18 mmol/L — AB (ref 22–32)
CREATININE: 1.7 mg/dL — AB (ref 0.44–1.00)
Calcium: 8.4 mg/dL — ABNORMAL LOW (ref 8.9–10.3)
Calcium: 6.3 mg/dL — CL (ref 8.9–10.3)
Creatinine, Ser: 1.55 mg/dL — ABNORMAL HIGH (ref 0.44–1.00)
GFR calc Af Amer: 35 mL/min — ABNORMAL LOW (ref >60)
GFR calc non Af Amer: 30 mL/min — ABNORMAL LOW (ref >60)
GFR calc non Af Amer: 34 mL/min — ABNORMAL LOW (ref >60)
GFR, EST AFRICAN AMERICAN: 39 mL/min — AB (ref >60)
GLUCOSE: 102 mg/dL — AB (ref 65–99)
Glucose, Bld: 323 mg/dL — ABNORMAL HIGH (ref 65–99)
POTASSIUM: 5.1 mmol/L (ref 3.5–5.1)
Potassium: 4.9 mmol/L (ref 3.5–5.1)
Sodium: 138 mmol/L (ref 135–145)
Sodium: 134 mmol/L — ABNORMAL LOW (ref 135–145)

## 2015-12-08 LAB — PROTIME-INR
INR: 1.15
INR: 1.45
PROTHROMBIN TIME: 17.8 s — AB (ref 11.4–15.2)
Prothrombin Time: 14.7 seconds (ref 11.4–15.2)

## 2015-12-08 LAB — I-STAT CHEM 8, ED
BUN: 26 mg/dL — ABNORMAL HIGH (ref 6–20)
CREATININE: 2 mg/dL — AB (ref 0.44–1.00)
Calcium, Ion: 1.13 mmol/L — ABNORMAL LOW (ref 1.15–1.40)
Chloride: 101 mmol/L (ref 101–111)
Glucose, Bld: 243 mg/dL — ABNORMAL HIGH (ref 65–99)
HEMATOCRIT: 33 % — AB (ref 36.0–46.0)
HEMOGLOBIN: 11.2 g/dL — AB (ref 12.0–15.0)
Potassium: 4.3 mmol/L (ref 3.5–5.1)
Sodium: 136 mmol/L (ref 135–145)
TCO2: 24 mmol/L (ref 0–100)

## 2015-12-08 LAB — PREPARE RBC (CROSSMATCH)

## 2015-12-08 LAB — POCT I-STAT 3, ART BLOOD GAS (G3+)
Acid-base deficit: 3 mmol/L — ABNORMAL HIGH (ref 0.0–2.0)
BICARBONATE: 22.7 mmol/L (ref 20.0–28.0)
O2 SAT: 100 %
PCO2 ART: 42.9 mmHg (ref 32.0–48.0)
PO2 ART: 510 mmHg — AB (ref 83.0–108.0)
Patient temperature: 97.7
TCO2: 24 mmol/L (ref 0–100)
pH, Arterial: 7.33 — ABNORMAL LOW (ref 7.350–7.450)

## 2015-12-08 LAB — APTT
aPTT: 28 seconds (ref 24–36)
aPTT: 29 seconds (ref 24–36)

## 2015-12-08 LAB — CBC WITH DIFFERENTIAL/PLATELET
BASOS PCT: 0 %
Basophils Absolute: 0 10*3/uL (ref 0.0–0.1)
EOS PCT: 4 %
Eosinophils Absolute: 0.7 10*3/uL (ref 0.0–0.7)
HEMATOCRIT: 34.9 % — AB (ref 36.0–46.0)
HEMOGLOBIN: 10.5 g/dL — AB (ref 12.0–15.0)
LYMPHS PCT: 39 %
Lymphs Abs: 6.7 10*3/uL — ABNORMAL HIGH (ref 0.7–4.0)
MCH: 26.8 pg (ref 26.0–34.0)
MCHC: 30.1 g/dL (ref 30.0–36.0)
MCV: 89 fL (ref 78.0–100.0)
Monocytes Absolute: 0.9 10*3/uL (ref 0.1–1.0)
Monocytes Relative: 5 %
NEUTROS ABS: 9 10*3/uL — AB (ref 1.7–7.7)
NEUTROS PCT: 52 %
Platelets: 577 10*3/uL — ABNORMAL HIGH (ref 150–400)
RBC: 3.92 MIL/uL (ref 3.87–5.11)
RDW: 13 % (ref 11.5–15.5)
WBC: 17.3 10*3/uL — ABNORMAL HIGH (ref 4.0–10.5)

## 2015-12-08 LAB — HEPATIC FUNCTION PANEL
ALT: 7 U/L — ABNORMAL LOW (ref 14–54)
AST: 14 U/L — AB (ref 15–41)
Albumin: 1.7 g/dL — ABNORMAL LOW (ref 3.5–5.0)
Alkaline Phosphatase: 50 U/L (ref 38–126)
BILIRUBIN TOTAL: 0.4 mg/dL (ref 0.3–1.2)
Total Protein: 3.2 g/dL — ABNORMAL LOW (ref 6.5–8.1)

## 2015-12-08 LAB — MRSA PCR SCREENING: MRSA by PCR: NEGATIVE

## 2015-12-08 LAB — I-STAT TROPONIN, ED: Troponin i, poc: 0.01 ng/mL (ref 0.00–0.08)

## 2015-12-08 LAB — LACTIC ACID, PLASMA
Lactic Acid, Venous: 1.9 mmol/L (ref 0.5–1.9)
Lactic Acid, Venous: 2.3 mmol/L (ref 0.5–1.9)

## 2015-12-08 LAB — LIPASE, BLOOD: LIPASE: 37 U/L (ref 11–51)

## 2015-12-08 LAB — TROPONIN I: Troponin I: 0.04 ng/mL (ref <0.03)

## 2015-12-08 SURGERY — EGD (ESOPHAGOGASTRODUODENOSCOPY)
Anesthesia: Moderate Sedation | Laterality: Left

## 2015-12-08 SURGERY — EGD (ESOPHAGOGASTRODUODENOSCOPY)
Anesthesia: Moderate Sedation

## 2015-12-08 MED ORDER — SODIUM CHLORIDE 0.9 % IV BOLUS (SEPSIS)
1000.0000 mL | Freq: Once | INTRAVENOUS | Status: AC
  Administered 2015-12-08: 1000 mL via INTRAVENOUS

## 2015-12-08 MED ORDER — MIDAZOLAM HCL 2 MG/2ML IJ SOLN: 1.0000 mg | INTRAMUSCULAR | Status: DC | PRN

## 2015-12-08 MED ORDER — SODIUM CHLORIDE 0.9 % IV SOLN
INTRAVENOUS | Status: DC
  Administered 2015-12-08: 19:00:00 via INTRAVENOUS

## 2015-12-08 MED ORDER — MIDAZOLAM HCL 2 MG/2ML IJ SOLN
INTRAMUSCULAR | Status: AC
  Administered 2015-12-08: 2 mg via INTRAVENOUS
  Filled 2015-12-08: qty 2

## 2015-12-08 MED ORDER — MIDAZOLAM HCL 2 MG/2ML IJ SOLN
INTRAMUSCULAR | Status: AC | PRN
  Administered 2015-12-08: 1 mg via INTRAVENOUS
  Administered 2015-12-08: 0.5 mg via INTRAVENOUS

## 2015-12-08 MED ORDER — FENTANYL CITRATE (PF) 100 MCG/2ML IJ SOLN
INTRAMUSCULAR | Status: AC
  Filled 2015-12-08: qty 2

## 2015-12-08 MED ORDER — LIDOCAINE HCL 1 % IJ SOLN
INTRAMUSCULAR | Status: AC
  Filled 2015-12-08: qty 20

## 2015-12-08 MED ORDER — NOREPINEPHRINE BITARTRATE 1 MG/ML IV SOLN
0.0000 ug/min | INTRAVENOUS | Status: DC
  Administered 2015-12-08: 25 ug/min via INTRAVENOUS
  Administered 2015-12-08: 5 ug/min via INTRAVENOUS
  Filled 2015-12-08 (×2): qty 4

## 2015-12-08 MED ORDER — SODIUM CHLORIDE 0.9 % IV SOLN: Freq: Once | INTRAVENOUS | Status: DC

## 2015-12-08 MED ORDER — LIDOCAINE HCL 1 % IJ SOLN
INTRAMUSCULAR | Status: AC | PRN
  Administered 2015-12-08: 20 mL

## 2015-12-08 MED ORDER — SODIUM CHLORIDE 0.9 % IV SOLN: INTRAVENOUS | Status: DC | PRN

## 2015-12-08 MED ORDER — CALCIUM CHLORIDE 10 % IV SOLN
1.0000 g | Freq: Once | INTRAVENOUS | Status: AC
  Administered 2015-12-08: 1 g via INTRAVENOUS
  Filled 2015-12-08: qty 10

## 2015-12-08 MED ORDER — FENTANYL CITRATE (PF) 100 MCG/2ML IJ SOLN
INTRAMUSCULAR | Status: AC
  Filled 2015-12-08: qty 4

## 2015-12-08 MED ORDER — SODIUM CHLORIDE 0.9 % IV SOLN: INTRAVENOUS | Status: DC

## 2015-12-08 MED ORDER — PROMETHAZINE HCL 25 MG/ML IJ SOLN
12.5000 mg | Freq: Four times a day (QID) | INTRAMUSCULAR | Status: DC | PRN
Start: 2015-12-08 — End: 2015-12-10
  Administered 2015-12-08: 12.5 mg via INTRAVENOUS
  Filled 2015-12-08: qty 1

## 2015-12-08 MED ORDER — ORAL CARE MOUTH RINSE
15.0000 mL | Freq: Two times a day (BID) | OROMUCOSAL | Status: DC
  Administered 2015-12-08: 15 mL via OROMUCOSAL

## 2015-12-08 MED ORDER — MIDAZOLAM HCL 2 MG/2ML IJ SOLN
2.0000 mg | Freq: Once | INTRAMUSCULAR | Status: AC
  Administered 2015-12-08: 2 mg via INTRAVENOUS

## 2015-12-08 MED ORDER — MIDAZOLAM HCL 5 MG/ML IJ SOLN
INTRAMUSCULAR | Status: AC
  Filled 2015-12-08: qty 3

## 2015-12-08 MED ORDER — PANTOPRAZOLE SODIUM 40 MG IV SOLR
80.0000 mg | Freq: Once | INTRAVENOUS | Status: AC
  Administered 2015-12-08: 80 mg via INTRAVENOUS
  Filled 2015-12-08: qty 80

## 2015-12-08 MED ORDER — FENTANYL CITRATE (PF) 100 MCG/2ML IJ SOLN
25.0000 ug | INTRAMUSCULAR | Status: DC | PRN
  Administered 2015-12-08 (×2): 50 ug via INTRAVENOUS
  Filled 2015-12-08: qty 2

## 2015-12-08 MED ORDER — LIDOCAINE HCL (CARDIAC) 20 MG/ML IV SOLN
INTRAVENOUS | Status: DC | PRN
  Administered 2015-12-08: 80 mg via INTRAVENOUS

## 2015-12-08 MED ORDER — MIDAZOLAM HCL 5 MG/ML IJ SOLN
INTRAMUSCULAR | Status: AC
Start: 2015-12-08 — End: 2015-12-08
  Filled 2015-12-08: qty 2

## 2015-12-08 MED ORDER — SODIUM CHLORIDE 0.9% FLUSH
10.0000 mL | Freq: Two times a day (BID) | INTRAVENOUS | Status: DC
  Administered 2015-12-08: 20 mL
  Administered 2015-12-08 – 2015-12-09 (×3): 10 mL

## 2015-12-08 MED ORDER — EPINEPHRINE HCL 0.1 MG/ML IJ SOSY
PREFILLED_SYRINGE | INTRAMUSCULAR | Status: AC
  Filled 2015-12-08: qty 10

## 2015-12-08 MED ORDER — IOPAMIDOL (ISOVUE-300) INJECTION 61%
INTRAVENOUS | Status: AC
  Administered 2015-12-08: 40 mL
  Filled 2015-12-08: qty 100

## 2015-12-08 MED ORDER — CHLORHEXIDINE GLUCONATE 0.12 % MT SOLN
15.0000 mL | Freq: Two times a day (BID) | OROMUCOSAL | Status: DC
  Administered 2015-12-08 – 2015-12-09 (×3): 15 mL via OROMUCOSAL

## 2015-12-08 MED ORDER — SUCCINYLCHOLINE CHLORIDE 20 MG/ML IJ SOLN
INTRAMUSCULAR | Status: DC | PRN
  Administered 2015-12-08: 120 mg via INTRAVENOUS

## 2015-12-08 MED ORDER — MIDAZOLAM HCL 10 MG/2ML IJ SOLN
INTRAMUSCULAR | Status: DC | PRN
  Administered 2015-12-08: 1 mg via INTRAVENOUS
  Administered 2015-12-08 (×2): 2 mg via INTRAVENOUS

## 2015-12-08 MED ORDER — FENTANYL CITRATE (PF) 2500 MCG/50ML IJ SOLN
0.0000 ug/h | INTRAMUSCULAR | Status: DC
  Administered 2015-12-08: 100 ug/h via INTRAVENOUS
  Filled 2015-12-08 (×2): qty 50

## 2015-12-08 MED ORDER — IOPAMIDOL (ISOVUE-300) INJECTION 61%
INTRAVENOUS | Status: AC
  Administered 2015-12-08: 15 mL
  Filled 2015-12-08: qty 100

## 2015-12-08 MED ORDER — FENTANYL CITRATE (PF) 100 MCG/2ML IJ SOLN
INTRAMUSCULAR | Status: DC | PRN
  Administered 2015-12-08 (×2): 25 ug via INTRAVENOUS

## 2015-12-08 MED ORDER — PROPOFOL 10 MG/ML IV BOLUS
INTRAVENOUS | Status: DC | PRN
  Administered 2015-12-08: 100 mg via INTRAVENOUS

## 2015-12-08 MED ORDER — MIDAZOLAM HCL 2 MG/2ML IJ SOLN
1.0000 mg | INTRAMUSCULAR | Status: DC | PRN
  Administered 2015-12-08: 1 mg via INTRAVENOUS
  Filled 2015-12-08: qty 2

## 2015-12-08 MED ORDER — IPRATROPIUM-ALBUTEROL 0.5-2.5 (3) MG/3ML IN SOLN: 3.0000 mL | RESPIRATORY_TRACT | Status: DC | PRN

## 2015-12-08 MED ORDER — THIAMINE HCL 100 MG/ML IJ SOLN
100.0000 mg | Freq: Every day | INTRAMUSCULAR | Status: DC
  Administered 2015-12-09: 100 mg via INTRAVENOUS
  Filled 2015-12-08: qty 2

## 2015-12-08 MED ORDER — SODIUM CHLORIDE 0.9 % IV SOLN
8.0000 mg/h | INTRAVENOUS | Status: DC
  Administered 2015-12-08 – 2015-12-09 (×3): 8 mg/h via INTRAVENOUS
  Filled 2015-12-08 (×10): qty 80

## 2015-12-08 MED ORDER — MIDAZOLAM HCL 2 MG/2ML IJ SOLN
INTRAMUSCULAR | Status: AC
  Filled 2015-12-08: qty 2

## 2015-12-08 MED ORDER — ALPRAZOLAM 0.5 MG PO TABS
0.5000 mg | ORAL_TABLET | Freq: Three times a day (TID) | ORAL | Status: DC | PRN
  Administered 2015-12-08: 0.5 mg via ORAL
  Filled 2015-12-08: qty 1

## 2015-12-08 MED ORDER — SODIUM CHLORIDE 0.9 % IJ SOLN
PREFILLED_SYRINGE | INTRAMUSCULAR | Status: DC | PRN
  Administered 2015-12-08: 5 mL

## 2015-12-08 MED ORDER — PANTOPRAZOLE SODIUM 40 MG IV SOLR: 40.0000 mg | Freq: Two times a day (BID) | INTRAVENOUS | Status: DC

## 2015-12-08 MED ORDER — ONDANSETRON HCL 4 MG/2ML IJ SOLN
4.0000 mg | Freq: Four times a day (QID) | INTRAMUSCULAR | Status: DC | PRN
Start: 2015-12-08 — End: 2015-12-08
  Filled 2015-12-08: qty 2

## 2015-12-08 MED ORDER — ONDANSETRON HCL 4 MG/2ML IJ SOLN
4.0000 mg | INTRAMUSCULAR | Status: DC | PRN
Start: 2015-12-08 — End: 2015-12-10
  Administered 2015-12-08 – 2015-12-09 (×4): 4 mg via INTRAVENOUS
  Filled 2015-12-08 (×2): qty 2

## 2015-12-08 MED ORDER — SODIUM CHLORIDE 0.9% FLUSH: 10.0000 mL | INTRAVENOUS | Status: DC | PRN

## 2015-12-08 MED ORDER — FENTANYL CITRATE (PF) 100 MCG/2ML IJ SOLN
INTRAMUSCULAR | Status: AC | PRN
  Administered 2015-12-08 (×2): 25 ug via INTRAVENOUS

## 2015-12-08 MED ORDER — CALCIUM GLUCONATE 10 % IV SOLN: 1.0000 g | Freq: Once | INTRAVENOUS | Status: DC

## 2015-12-08 MED ORDER — SODIUM CHLORIDE 0.9 % IV SOLN: 250.0000 mL | INTRAVENOUS | Status: DC | PRN

## 2015-12-08 MED ORDER — PROPOFOL 1000 MG/100ML IV EMUL
INTRAVENOUS | Status: AC
  Filled 2015-12-08: qty 100

## 2015-12-08 MED ORDER — IOPAMIDOL (ISOVUE-370) INJECTION 76%
INTRAVENOUS | Status: AC
  Administered 2015-12-08: 75 mL
  Filled 2015-12-08: qty 100

## 2015-12-08 MED ORDER — ORAL CARE MOUTH RINSE
15.0000 mL | Freq: Four times a day (QID) | OROMUCOSAL | Status: DC
  Administered 2015-12-09 (×2): 15 mL via OROMUCOSAL

## 2015-12-08 MED ORDER — SODIUM CHLORIDE 0.9 % IV SOLN
10.0000 mL/h | Freq: Once | INTRAVENOUS | Status: AC
  Administered 2015-12-08: 10 mL/h via INTRAVENOUS

## 2015-12-08 NOTE — ED Notes (Signed)
Report given to 2 H, RN

## 2015-12-08 NOTE — Progress Notes (Addendum)
[facility] Progress Note Patient Name: Lauren Mckenzie DOB: November 08, 1950 MRN: [id number]   Date of Service  12/25/2015  HPI/Events of Note  Hypotension and epigastric pain - associated with N/V and diaphoresis. Hgb = 11.2.   eICU Interventions  Will order: 1. Norepinephrine IV infusion. 2. 12 Lead EKG STAT >> done - No acute changes. NSR. 3. Cycle Troponin. 4. Will ask ground on-call team to evaluate at bedside.      Intervention Category Intermediate Interventions: Abdominal pain - evaluation and management  Falicia Lizotte [facility] 12/16/2015, 6:43 PM

## 2015-12-08 NOTE — Progress Notes (Signed)
Subjective: Since I last evaluated the patient, she has her GDA coiled and was stable till about 545 pm when she had recurrent hematemesis with hypotension requiring pressors and has also passed a large bloody BM. I have spoken to Dr. Greer Pickerel who is going to review her chart and make recommendations. I spoke to CCM about intubating her before the repeat EGD I have planned to do but as the CCM extender is not available for 30 minutes they have advised that we call anesthesia to help intubate her for the EGD.  Thomas Mabry 12/16/2015, 8:34 PM

## 2015-12-08 NOTE — Progress Notes (Signed)
   [facility]  ELINK called me to assess pt. Pt admitted today for UGIB and had coiling of gastric/duodenal ulcer. Did OK with procedure.   1 hr ago, pt looked clammy and had LLQ pain (worse than baseline). Was diaphoretic. (-) cp. Had some bloody emesis. BP 80 systolic > got 1L bolus and started on levophed. Currently, BP is 90 systolic, HR 123XX123, RR 18, sats on 100% on NRBM. (-) NVD. Good air entry, clear to auscultation. Good s1/s2. (+) BS, soft, non tender. (-) masses. Cool extremities.   Pt has recievd 3upBRC since this am. Last Hb was at 6pm at 11. EKG NSR, (-) ischemia.  CXR this am (-) acute changes.   Plan : 1. Will finish 1L NS bolus. Keep on NS at 125 mls/hr 2. Troponins, bmp, lactate have been sent.  3. Check POCT Hb/hct at 8pm and transfuse accordingly. If hb is lower, likely will need scan of abd/pelvis 4. Wean down FiO2. ABG with po2 in 500+.  5. Fiance has been updated.   Monica Becton, MD 12/19/2015, 7:08 PM [facility] Pager [phone] After 3 pm or if no answer, call [phone]

## 2015-12-08 NOTE — Consult Note (Signed)
Chief Complaint: Upper GI bleeding.   Referring Physician(s): Dr. Collene Mares.   Supervising Physician: Corrie Mckusick  Patient Status: Inpatient  History of Present Illness: Lauren Mckenzie is a 65 y.o. female presenting to [facility] with upper GI bleeding/hematemesis.    VIR was contacted by Dr. Collene Mares of GI, after upper endoscopy.  The patient has a history of bariatric surgery.  The endoscopy had evidence of bleeding at the proximal duodenum, with large blood clot.    [name] has anemia on her first CBC today, likely blood loss related, with hgb of 10.5.  She has received 2 UPRBC's when VIR evaluated at bedside, with 3rd pending.    Vital signs have been labile, with SBP ranging from 80's to 142.  Currently 126SBP.  The HR has ranged from 59 to the 90's.  Currently 61.  She continues with volume resuscitation.    She has a history in her labs of renal insufficiency, with elevated creatinine dating to early 2017.  Currently is above 2.0.    [name] tells me this is her first episode of bleeding.  She has ongoing abd pain in the upper abdomen of several weeks to months, but the hematemesis started this morning/overnight.  She denies BRBPR or melana.  Otherwise normal bowel habits.    She has a fiance with her today, Marcello Moores, with cell number: [phone].  She has no other family in the area.  No children.  No living siblings.  She has several close friends in the region for social support.      Past Medical History:  Diagnosis Date  . Abdominal pain     status post gastric bypass for bariatric surgical   management.  . Anemia   . Anxiety   . Anxiety and depression   . Depression   . Hyperlipidemia   . Hypertension   . Hypokalemia   . Hypothyroid   . Prediabetes   . S/P gastric bypass     Past Surgical History:  Procedure Laterality Date  . CERVICAL FUSION     C6-7  . CHOLECYSTECTOMY    . GASTRIC BYPASS    . TUBAL LIGATION      Allergies: Shellfish allergy and  Theophylline  Medications: Prior to Admission medications   Medication Sig Start Date End Date Taking? Authorizing Provider  albuterol (PROAIR HFA) 108 (90 Base) MCG/ACT inhaler INHALE TWO PUFFS BY MOUTH 4 TIMES DAILY TO EVERY 4 HOURS 07/10/15   Unk Pinto, MD  ALPRAZolam Duanne Moron) 1 MG tablet Take 1 mg by mouth 3 (three) times daily as needed for anxiety.     Historical Provider, MD  aspirin 81 MG EC tablet Take 81 mg by mouth daily.      Historical Provider, MD  calcium carbonate (OS-CAL) 600 MG TABS Take 600 mg by mouth daily.      Historical Provider, MD  Cholecalciferol (VITAMIN D3) 5000 units CAPS Take 10,000 Units by mouth daily.    Historical Provider, MD  CINNAMON PO Take 1,000 mg by mouth 3 (three) times daily.    Historical Provider, MD  Cyanocobalamin (VITAMIN B-12 PO) Take 1 tablet by mouth daily.    Historical Provider, MD  EPINEPHrine (EPI-PEN) 0.3 mg/0.3 mL SOAJ injection Inject 0.3 mLs (0.3 mg total) into the muscle once. 07/17/13   Kelby Aline, PA-C  ferrous sulfate 325 (65 FE) MG tablet Take 325 mg by mouth daily with breakfast.     Historical Provider, MD  furosemide (LASIX) 40  MG tablet TAKE 1 TABLET BY MOUTH TWICE A DAY 11/13/15   Unk Pinto, MD  gabapentin (NEURONTIN) 300 MG capsule Take 2 capsules (600 mg total) by mouth 3 (three) times daily. 10/15/15   Vicie Mutters, PA-C  ipratropium-albuterol (DUONEB) 0.5-2.5 (3) MG/3ML SOLN USE 1 VIAL IN NEBULIZER 4 TIMES A DAY Patient taking differently: USE 1 VIAL IN NEBULIZER 4 TIMES A DAY as needed for shortness of breath    Melissa Smith, PA-C  levothyroxine (SYNTHROID, LEVOTHROID) 50 MCG tablet TAKE 1 TABLET BY MOUTH ONCE DAILY 11/04/15   Vicie Mutters, PA-C  lidocaine (LMX) 4 % cream Apply 1 application topically as needed. 10/16/15   Vicie Mutters, PA-C  MAGNESIUM PO Take 1 tablet by mouth 2 (two) times daily.    Historical Provider, MD  meloxicam (MOBIC) 15 MG tablet Take 1 tablet (15 mg total) by mouth daily. 04/23/15    Max T Hyatt, DPM  rOPINIRole (REQUIP) 0.5 MG tablet Take 1 tablet (0.5 mg total) by mouth 3 (three) times daily. 10/15/15 10/14/16  Vicie Mutters, PA-C  Simethicone (GAS-X PO) Take 2 tablets by mouth daily as needed (gas). Reported on 09/04/2015    Historical Provider, MD  traZODone (DESYREL) 100 MG tablet TAKE 1/2 TO 2 TABLETS AT BEDTIME 05/10/15   Historical Provider, MD  triamcinolone ointment (KENALOG) 0.5 % Apply to rash 2 to 4 x day as needed 10/15/15   Vicie Mutters, PA-C  venlafaxine (EFFEXOR) 100 MG tablet Take 100 mg by mouth 4 (four) times daily - after meals and at bedtime.     Historical Provider, MD     Family History  Problem Relation Age of Onset  . Heart attack Mother   . Heart attack Father     Social History   Social History  . Marital status: Divorced    Spouse name: N/A  . Number of children: N/A  . Years of education: N/A   Occupational History  . unemployed    Social History Main Topics  . Smoking status: Never Smoker  . Smokeless tobacco: Never Used  . Alcohol use No  . Drug use: No  . Sexual activity: Yes   Other Topics Concern  . None   Social History Narrative   Has chihuahua      Never smoked, no alcohol     Review of Systems: A 12 point ROS discussed and pertinent positives are indicated in the HPI above.  All other systems are negative.  Review of Systems  Vital Signs: BP 126/64 (BP Location: Right Arm)   Pulse 61   Temp 97.7 F (36.5 C) (Oral)   Resp 11   Ht 5\' 5"  (1.651 m)   Wt 210 lb 8.6 oz (95.5 kg)   SpO2 99%   BMI 35.04 kg/m   Physical Exam   Atraumatic, normocephalic Mucous membranes pale.   Conjugate gaze.  No scleral icterus or injection. Alert and oriented to person, place and time.  Appropriate insight and judgement. Appropriate questions. Symmetric excursion of the chest on inspiration and expiration.  No labored breathing.  [place] in place.  Tender abd.   Palpable pedal pulses.   GU deferred.     Mallampati  Score: 2  MD Evaluation Airway: WNL Heart: WNL Abdomen: Other (comments) Abdomen comments: obese with epigastric tenderness on palpation, hypoactive BS Chest/ Lungs: WNL ASA  Classification: 4 Mallampati/Airway Score: Two  Imaging: Dg Chest Port 1 View  Result Date: 12/06/2015 CLINICAL DATA:  65 year old female with a history  of vomiting blood. Gastric bypass. EXAM: PORTABLE CHEST 1 VIEW COMPARISON:  07/09/2015 FINDINGS: Cardiomediastinal silhouette unchanged in size and contour. Surgical changes of the cervical region. No pneumothorax or pleural effusion. Coarsened interstitial markings, similar to the comparison study. No confluent airspace disease. No displaced fracture. IMPRESSION: Chronic lung changes without evidence of superimposed acute cardiopulmonary disease. Signed, Dulcy Fanny. Earleen Newport, DO [facility] Electronically Signed   By: Corrie Mckusick D.O.   On: 12/15/2015 09:22   Dg Duanne Limerick  W/kub  Result Date: 12/06/2015 CLINICAL DATA:  Right upper quadrant and epigastric pain. Prior gastric bypass. EXAM: UPPER GI SERIES WITH KUB TECHNIQUE: After obtaining a scout radiograph a routine upper GI series was performed using thin barium FLUOROSCOPY TIME:  Radiation Exposure Index (as provided by the fluoroscopic device): 86 d Gy cm2 If the device does not provide the exposure index: Fluoroscopy Time (in minutes and seconds):  2 minutes 6 seconds Number of Acquired Images:  15 COMPARISON:  None. FINDINGS: Fluoroscopic evaluation of swallowing shows disruption of all primary esophageal peristaltic waves with full column stasis. No esophageal stricture or fold thickening. The patient is status post gastric bypass. Prompt emptying of contrast from the stomach into the small bowel. No ulceration or mass. Proximal small bowel unremarkable. IMPRESSION: Status post gastric bypass with small gastric remnant unremarkable. Prompt emptying of the gastric  remnant into normal appearing proximal small bowel. Esophageal dysmotility with full column stasis. Electronically Signed   By: Rolm Baptise M.D.   On: 12/06/2015 09:56    Labs:  CBC:  Recent Labs  07/07/15 0543 10/15/15 1539 12/06/15 1034 12/27/2015 0820 12/12/2015 0830  WBC 4.9 14.0* 11.8* 17.3*  --   HGB 11.7* 15.6* 12.6 10.5* 11.2*  HCT 37.2 48.9* 39.5 34.9* 33.0*  PLT 210 434* 411* 577*  --     COAGS:  Recent Labs  12/03/2015 0820  INR 1.15  APTT 28    BMP:  Recent Labs  07/08/15 0524 10/15/15 1539 12/06/15 1034 11/30/2015 0820 12/19/2015 0830  NA 144 143 137 137 136  K 4.4 5.2 5.2 4.4 4.3  CL 108 101 95* 100* 101  CO2 29 26 28 24   --   GLUCOSE 93 93 107* 249* 243*  BUN 8 14 23  24* 26*  CALCIUM 9.1 10.7* 10.1 9.2  --   CREATININE 1.45* 1.98* 2.42* 2.14* 2.00*  GFRNONAA 37* 26* 20* 23*  --   GFRAA 43* 30* 23* 27*  --     LIVER FUNCTION TESTS:  Recent Labs  07/08/15 0524 07/09/15 0529 10/15/15 1539 12/08/15 0820  BILITOT 0.3 0.3 0.3 0.3  AST 109* 56* 15 18  ALT 109* 85* 12 7*  ALKPHOS 216* 212* 127 92  PROT 6.2* 6.4* 7.4 5.4*  ALBUMIN 3.5 3.6 4.6 2.6*    TUMOR MARKERS: No results for input(s): AFPTM, CEA, CA199, CHROMGRNA in the last 8760 hours.  Assessment and Plan:  65 year old female presenting with upper GI bleeding and acute blood loss anemia, with hemodynamic instability.    Evidence of bleeding at the duodenal bulb on endoscopy.  Given her labile vital signs, angiogram and possible intervention is indicated.   Agree with current resuscitation, and will proceed with angiogram.    She has abnormal renal function at baseline, which puts her kidneys at risk for CIN.  I have discussed this with her and her finance.  In addition, I have discussed the principles of angiogram and embolization, including the  life-threatening nature of her bleed, and the risks of our procedure.  Specific risks discussed: bleeding, infection, arteria  injury/dissection, need for further procedure/surgery, contrast reaction, kidney injury, bowel ischemia, cardiopulmonary collapse, death.    She would like to proceed with the angiogram and possible treatment.   Thank you for this interesting consult.  I greatly enjoyed meeting CHERALYN FRAVEL and look forward to participating in their care.  A copy of this report was sent to the requesting provider on this date.  Electronically Signed: Corrie Mckusick 12/25/2015, 12:43 PM   I spent a total of 40 Minutes    in face to face in clinical consultation, greater than 50% of which was counseling/coordinating care for life-threatening upper GI bleeding, mesenteric angiogram, possible embolization, with central line placement.

## 2015-12-08 NOTE — Progress Notes (Signed)
Pt c/o of abdomen pain and then pt proceeded to vomit roughly 250cc bright red blood and pt became hypotensive.  MD notified. Will continue to monitor closely and update as needed.

## 2015-12-08 NOTE — Consult Note (Signed)
Reason for Consult: Vomiting blood. Referring Physician: ER MD-Dr. April Plunkett.  Lauren Mckenzie is an 65 y.o. female.  HPI: 65 year old white female with multiple medical problems including HTN, CKD, anxiety and depression, hypothyroidism, hypertension hyperlipidemia and history of gastric bypass in 2003, well known to Dr. Jeani Hawking from previous encounters, presented to the [facility] ER  via EMS after she had multiple episodes of hematemesis. She claims she's had some epigastric pain for a few weeks and has had dysphagia for solids. She claims she's had dysphagia since she had her gastric bypass in 2003 [Rou-en-Y gastric bypass].  She was hypotensive and confused on arrival in the emergency room but has responded well to fluids and seems to be maybe more alert and responsive at this time. She claims she had an endoscopy with Dr. Elnoria Howard 5 weeks ago but I was unable to connect to the office electronic record. She tells me her endoscopy was normal at that time. On reviewing her records she's had an MRCP done earlier this year which revealed dilated bile ducts. This was done as she had had elevation in her liver functions. She describes having an ERCP done but I cannot find any records on Epic regarding the procedure. Her boyfriend who accompanies her today is not aware of the details on her medical history. She's also had a CT scan of the abdomen and pelvis done on 07/05/2015 wh. en a question of a gastric gastric fistula was raised by Dr. Irish Lack.    Past Medical History:  Diagnosis Date  .  Chronic abdominal pain     status post gastric bypass for bariatric surgical   management.  .  Asthma   . Anxiety and depression   . Hyperlipidemia   . Hypertension   . Hypokalemia   . Hypothyroid   . Prediabetes   . S/P Rou-en-Y gastric bypass    Past Surgical History:  Procedure Laterality Date  . CERVICAL FUSION     C6-7  . CHOLECYSTECTOMY     PARATHYROIDECTOMY    .  ROU-EN-Y GASTRIC BYPASS     . TUBAL LIGATION     Family History  Problem Relation Age of Onset  . Heart attack Mother   . Heart attack Father    Social History:  reports that she has never smoked. She has never used smokeless tobacco. She reports that she does not drink alcohol or use drugs.  Allergies:  Allergies  Allergen Reactions  . Shellfish Allergy Anaphylaxis  . Theophylline Anaphylaxis   Medications: I have reviewed the patient's current medications.  Results for orders placed or performed during the hospital encounter of 12/06/2015 (from the past 48 hour(s))  CBC with Differential/Platelet     Status: Abnormal   Collection Time: 12/07/2015  8:20 AM  Result Value Ref Range   WBC 17.3 (H) 4.0 - 10.5 K/uL   RBC 3.92 3.87 - 5.11 MIL/uL   Hemoglobin 10.5 (L) 12.0 - 15.0 g/dL   HCT 56.7 (L) 20.9 - 19.8 %   MCV 89.0 78.0 - 100.0 fL   MCH 26.8 26.0 - 34.0 pg   MCHC 30.1 30.0 - 36.0 g/dL   RDW 02.2 17.9 - 81.0 %   Platelets 577 (H) 150 - 400 K/uL   Neutrophils Relative % 52 %   Lymphocytes Relative 39 %   Monocytes Relative 5 %   Eosinophils Relative 4 %   Basophils Relative 0 %   Neutro Abs 9.0 (H) 1.7 -  7.7 K/uL   Lymphs Abs 6.7 (H) 0.7 - 4.0 K/uL   Monocytes Absolute 0.9 0.1 - 1.0 K/uL   Eosinophils Absolute 0.7 0.0 - 0.7 K/uL   Basophils Absolute 0.0 0.0 - 0.1 K/uL   WBC Morphology ABSOLUTE LYMPHOCYTOSIS   Comprehensive metabolic panel     Status: Abnormal   Collection Time: 11/29/2015  8:20 AM  Result Value Ref Range   Sodium 137 135 - 145 mmol/L   Potassium 4.4 3.5 - 5.1 mmol/L   Chloride 100 (L) 101 - 111 mmol/L   CO2 24 22 - 32 mmol/L   Glucose, Bld 249 (H) 65 - 99 mg/dL   BUN 24 (H) 6 - 20 mg/dL   Creatinine, Ser 1.61 (H) 0.44 - 1.00 mg/dL   Calcium 9.2 8.9 - 09.6 mg/dL   Total Protein 5.4 (L) 6.5 - 8.1 g/dL   Albumin 2.6 (L) 3.5 - 5.0 g/dL   AST 18 15 - 41 U/L   ALT 7 (L) 14 - 54 U/L   Alkaline Phosphatase 92 38 - 126 U/L   Total Bilirubin 0.3 0.3 - 1.2 mg/dL   GFR calc non Af  Amer 23 (L) >60 mL/min   GFR calc Af Amer 27 (L) >60 mL/min    Comment: (NOTE) The eGFR has been calculated using the CKD EPI equation. This calculation has not been validated in all clinical situations. eGFR's persistently <60 mL/min signify possible Chronic Kidney Disease.    Anion gap 13 5 - 15  Lipase, blood     Status: None   Collection Time: 12/11/2015  8:20 AM  Result Value Ref Range   Lipase 37 11 - 51 U/L  Protime-INR     Status: None   Collection Time: 12/28/2015  8:20 AM  Result Value Ref Range   Prothrombin Time 14.7 11.4 - 15.2 seconds   INR 1.15   APTT     Status: None   Collection Time: 12/18/2015  8:20 AM  Result Value Ref Range   aPTT 28 24 - 36 seconds  Type and screen     Status: None (Preliminary result)   Collection Time: 12/11/2015  8:20 AM  Result Value Ref Range   ABO/RH(D) O POS    Antibody Screen NEG    Sample Expiration 12/11/2015    Unit Number [id number]    Blood Component Type RED CELLS,LR    Unit division 00    Status of Unit ISSUED    Unit tag comment VERBAL ORDERS PER DR PLUNCKETT    Transfusion Status OK TO TRANSFUSE    Crossmatch Result PENDING    Unit Number [id number]    Blood Component Type RED CELLS,LR    Unit division 00    Status of Unit ISSUED    Unit tag comment VERBAL ORDERS PER DR PLUNCKETT    Transfusion Status OK TO TRANSFUSE    Crossmatch Result PENDING    Unit Number [id number]    Blood Component Type RED CELLS,LR    Unit division 00    Status of Unit ISSUED    Transfusion Status OK TO TRANSFUSE    Crossmatch Result Compatible   I-stat troponin, ED     Status: None   Collection Time: 12/06/2015  8:28 AM  Result Value Ref Range   Troponin i, poc 0.01 0.00 - 0.08 ng/mL   Comment 3            Comment: Due to the release kinetics of cTnI, a negative result within  the first hours of the onset of symptoms does not rule out myocardial infarction with certainty. If myocardial infarction is still suspected, repeat the  test at appropriate intervals.   I-stat chem 8, ed     Status: Abnormal   Collection Time: 12/11/2015  8:30 AM  Result Value Ref Range   Sodium 136 135 - 145 mmol/L   Potassium 4.3 3.5 - 5.1 mmol/L   Chloride 101 101 - 111 mmol/L   BUN 26 (H) 6 - 20 mg/dL   Creatinine, Ser 2.00 (H) 0.44 - 1.00 mg/dL   Glucose, Bld 243 (H) 65 - 99 mg/dL   Calcium, Ion 1.13 (L) 1.15 - 1.40 mmol/L   TCO2 24 0 - 100 mmol/L   Hemoglobin 11.2 (L) 12.0 - 15.0 g/dL   HCT 33.0 (L) 36.0 - 46.0 %  I-Stat CG4 Lactic Acid, ED     Status: Abnormal   Collection Time: 12/07/2015  8:30 AM  Result Value Ref Range   Lactic Acid, Venous 5.91 (HH) 0.5 - 1.9 mmol/L   Comment NOTIFIED PHYSICIAN   Prepare RBC     Status: None   Collection Time: 11/29/2015  9:10 AM  Result Value Ref Range   Order Confirmation ORDER PROCESSED BY BLOOD BANK    Dg Chest Port 1 View  Result Date: 12/16/2015 CLINICAL DATA:  65 year old female with a history of vomiting blood. Gastric bypass. EXAM: PORTABLE CHEST 1 VIEW COMPARISON:  07/09/2015 FINDINGS: Cardiomediastinal silhouette unchanged in size and contour. Surgical changes of the cervical region. No pneumothorax or pleural effusion. Coarsened interstitial markings, similar to the comparison study. No confluent airspace disease. No displaced fracture. IMPRESSION: Chronic lung changes without evidence of superimposed acute cardiopulmonary disease. Signed, Dulcy Fanny. Earleen Newport, DO [facility] Radiology Electronically Signed   By: Corrie Mckusick D.O.   On: 12/10/2015 09:22   Review of Systems  Constitutional: Positive for diaphoresis and malaise/fatigue. Negative for chills, fever and weight loss.  HENT: Negative.   Eyes: Negative.   Respiratory: Negative.   Cardiovascular: Negative.   Gastrointestinal: Positive for abdominal pain, constipation, heartburn, nausea and vomiting. Negative for blood in stool, diarrhea and melena.  Genitourinary: Negative.    Musculoskeletal: Positive for joint pain.  Skin: Negative.   Neurological: Positive for weakness.  Endo/Heme/Allergies: Negative.   Psychiatric/Behavioral: Positive for depression. The patient is nervous/anxious.    Blood pressure (!) 100/50, pulse 68, temperature 97.3 F (36.3 C), temperature source Oral, resp. rate 21, height '5\' 5"'$  (1.651 m), weight 83.9 kg (185 lb), SpO2 100 %. Physical Exam  Constitutional: She is oriented to person, place, and time. She appears well-developed and well-nourished.  Obese, pale appearing white female  HENT:  Head: Normocephalic and atraumatic.  Eyes: Conjunctivae and EOM are normal. Pupils are equal, round, and reactive to light.  Neck: Normal range of motion. Neck supple.  Scar at the base of the neck in the midline  Cardiovascular: Normal rate and regular rhythm.   Respiratory: Effort normal and breath sounds normal.  GI: Soft. She exhibits no mass. There is tenderness. There is no rebound and no guarding.  Multiple scars from above mentioned surgeries  Neurological: She is alert and oriented to person, place, and time.  Skin: Skin is warm and dry.  Psychiatric: She has a normal mood and affect. Her behavior is normal. Judgment and thought content normal.   Assessment/Plan: 1) Acute upper GI bleed-hematemesis on Meloxicam and Aspirin/Dysphagia/Post hemorrhagic anemia-Emergent EGD planned now.  2)  Chronic abdominal pain. ?PUD.  3) Chronic constipation.  4) History of asthma.  5) HTN/Hyperlipdemia.  6) Chronic kidney disease. 7) Hypothyroidism.  8) Anxiety and depression.  Chiyeko Ferre 12/11/2015, 9:57 AM

## 2015-12-08 NOTE — Progress Notes (Signed)
[facility] Progress Note Patient Name: GENET FISCHL DOB: 1951-01-13 MRN: [id number]   Date of Service  12/04/2015  HPI/Events of Note  Hypotension - BP = 73/46. Now s/p GDA embolization for bleeding gastric and duodenal ulcers. Last Hgb = 11.2. Await repeat Hgb.  eICU Interventions  Will order: 1. 0.9 NaCl 1 liter IV over 1 hour now.      Intervention Category Intermediate Interventions: Hypotension - evaluation and management  Hisham Provence Eugene 12/12/2015, 6:16 PM

## 2015-12-08 NOTE — ED Provider Notes (Addendum)
[facility] DEPT Provider Note   CSN: [id number] Arrival date & time: 12/04/2015  0815     History   Chief Complaint Chief Complaint  Patient presents with  . GI Bleeding    HPI Lauren Mckenzie is a 65 y.o. female.  Patient is a 65 year old female with a history of ongoing abdominal pain status post gastric bypass, hypertension, hyperlipidemia, renal insufficiency presenting today with vomiting bright red blood that started this morning. Patient states for the last 6 weeks she's had moving upper abdominal pain that has not gotten any better. She has seen her specialist Dr. [name] last week and was noted to have ongoing elevated liver function tests. She was referred to Dr. Lucia Gaskins who she has not seen yet but at this time is unclear what's causing the pain. However this morning she woke up and had 3 episodes of bright red bloody emesis. She has never vomited blood in the past. She denies any alcohol use or history of varices. She does take meloxicam every day for arthritis but denies any ibuprofen, Goody powder or naproxen use.  She is still complaining of upper abdominal pain with some mild distention. She denies any urinary symptoms or diarrhea.   The history is provided by the patient and the EMS personnel.    Past Medical History:  Diagnosis Date  . Abdominal pain     status post gastric bypass for bariatric surgical   management.  . Anemia   . Anxiety   . Anxiety and depression   . Depression   . Hyperlipidemia   . Hypertension   . Hypokalemia   . Hypothyroid   . Prediabetes   . S/P gastric bypass     Patient Active Problem List   Diagnosis Date Noted  . Depression, major, in remission ([place]) 10/15/2015  . Encounter for general adult medical examination with abnormal findings 10/15/2015  . Tremor 09/04/2015  . Vitamin D deficiency 03/19/2014  . Medication management 03/19/2014  . Morbid obesity (BMI 38.53) 03/19/2014  . 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 Combined form of  senile cataract 09/28/2013  . Hypertension   . Anemia   . Anxiety   . Hypothyroid   . Prediabetes   . Hyperlipidemia 07/07/2010  . Asthma, chronic 03/17/2007    Past Surgical History:  Procedure Laterality Date  . CERVICAL FUSION     C6-7  . CHOLECYSTECTOMY    . GASTRIC BYPASS    . TUBAL LIGATION      OB History    No data available       Home Medications    Prior to Admission medications   Medication Sig Start Date End Date Taking? Authorizing Provider  albuterol (PROAIR HFA) 108 (90 Base) MCG/ACT inhaler INHALE TWO PUFFS BY MOUTH 4 TIMES DAILY TO EVERY 4 HOURS 07/10/15   Unk Pinto, MD  ALPRAZolam Duanne Moron) 1 MG tablet Take 1 mg by mouth 3 (three) times daily as needed for anxiety.     Historical Provider, MD  aspirin 81 MG EC tablet Take 81 mg by mouth daily.      Historical Provider, MD  calcium carbonate (OS-CAL) 600 MG TABS Take 600 mg by mouth daily.      Historical Provider, MD  Cholecalciferol (VITAMIN D3) 5000 units CAPS Take 10,000 Units by mouth daily.    Historical Provider, MD  CINNAMON PO Take 1,000 mg by mouth 3 (three) times daily.    Historical Provider, MD  Cyanocobalamin (VITAMIN B-12 PO) Take 1 tablet  by mouth daily.    Historical Provider, MD  EPINEPHrine (EPI-PEN) 0.3 mg/0.3 mL SOAJ injection Inject 0.3 mLs (0.3 mg total) into the muscle once. 07/17/13   Kelby Aline, PA-C  ferrous sulfate 325 (65 FE) MG tablet Take 325 mg by mouth daily with breakfast.     Historical Provider, MD  furosemide (LASIX) 40 MG tablet TAKE 1 TABLET BY MOUTH TWICE A DAY 11/13/15   Unk Pinto, MD  gabapentin (NEURONTIN) 300 MG capsule Take 2 capsules (600 mg total) by mouth 3 (three) times daily. 10/15/15   Vicie Mutters, PA-C  ipratropium-albuterol (DUONEB) 0.5-2.5 (3) MG/3ML SOLN USE 1 VIAL IN NEBULIZER 4 TIMES A DAY Patient taking differently: USE 1 VIAL IN NEBULIZER 4 TIMES A DAY as needed for shortness of breath    Melissa Smith, PA-C  levothyroxine (SYNTHROID,  LEVOTHROID) 50 MCG tablet TAKE 1 TABLET BY MOUTH ONCE DAILY 11/04/15   Vicie Mutters, PA-C  lidocaine (LMX) 4 % cream Apply 1 application topically as needed. 10/16/15   Vicie Mutters, PA-C  MAGNESIUM PO Take 1 tablet by mouth 2 (two) times daily.    Historical Provider, MD  meloxicam (MOBIC) 15 MG tablet Take 1 tablet (15 mg total) by mouth daily. 04/23/15   Max T Hyatt, DPM  rOPINIRole (REQUIP) 0.5 MG tablet Take 1 tablet (0.5 mg total) by mouth 3 (three) times daily. 10/15/15 10/14/16  Vicie Mutters, PA-C  Simethicone (GAS-X PO) Take 2 tablets by mouth daily as needed (gas). Reported on 09/04/2015    Historical Provider, MD  traZODone (DESYREL) 100 MG tablet TAKE 1/2 TO 2 TABLETS AT BEDTIME 05/10/15   Historical Provider, MD  triamcinolone ointment (KENALOG) 0.5 % Apply to rash 2 to 4 x day as needed 10/15/15   Vicie Mutters, PA-C  venlafaxine (EFFEXOR) 100 MG tablet Take 100 mg by mouth 4 (four) times daily - after meals and at bedtime.     Historical Provider, MD    Family History Family History  Problem Relation Age of Onset  . Heart attack Mother   . Heart attack Father     Social History Social History  Substance Use Topics  . Smoking status: Never Smoker  . Smokeless tobacco: Never Used  . Alcohol use No     Allergies   Shellfish allergy and Theophylline   Review of Systems Review of Systems  All other systems reviewed and are negative.    Physical Exam Updated Vital Signs There were no vitals taken for this visit.  Physical Exam  Constitutional: She is oriented to person, place, and time. She appears well-developed and well-nourished. She appears distressed.  HENT:  Head: Normocephalic and atraumatic.  Mouth/Throat: Oropharynx is clear and moist.  Bright red blood in the mouth. No apparent blood coming from the nose  Eyes: EOM are normal. Pupils are equal, round, and reactive to light.  Pale Conjunctiva  Neck: Normal range of motion. Neck supple.  Cardiovascular:  Regular rhythm and intact distal pulses.  Tachycardia present.   No murmur heard. Pulmonary/Chest: Effort normal and breath sounds normal. No respiratory distress. She has no wheezes. She has no rales.  Abdominal: Soft. She exhibits distension. There is tenderness in the epigastric area. There is no rebound and no guarding.    Musculoskeletal: Normal range of motion. She exhibits no edema or tenderness.  Neurological: She is alert and oriented to person, place, and time.  Gag reflex currently intact. Patient answering questions appropriately. Currently protecting her airway.  Skin: Skin  is warm. Capillary refill takes more than 3 seconds. No rash noted. She is diaphoretic. No erythema. There is pallor.  Psychiatric: She has a normal mood and affect. Her behavior is normal.  Nursing note and vitals reviewed.    ED Treatments / Results  Labs (all labs ordered are listed, but only abnormal results are displayed) Labs Reviewed  CBC WITH DIFFERENTIAL/PLATELET - Abnormal; Notable for the following:       Result Value   WBC 17.3 (*)    Hemoglobin 10.5 (*)    HCT 34.9 (*)    Platelets 577 (*)    All other components within normal limits  I-STAT CHEM 8, ED - Abnormal; Notable for the following:    BUN 26 (*)    Creatinine, Ser 2.00 (*)    Glucose, Bld 243 (*)    Calcium, Ion 1.13 (*)    Hemoglobin 11.2 (*)    HCT 33.0 (*)    All other components within normal limits  I-STAT CG4 LACTIC ACID, ED - Abnormal; Notable for the following:    Lactic Acid, Venous 5.91 (*)    All other components within normal limits  COMPREHENSIVE METABOLIC PANEL  LIPASE, BLOOD  PROTIME-INR  APTT  I-STAT TROPOININ, ED  TYPE AND SCREEN  PREPARE RBC (CROSSMATCH)    EKG  EKG Interpretation  Date/Time:  Sunday December 08 2015 08:19:18 EDT Ventricular Rate:  90 PR Interval:    QRS Duration: 77 QT Interval:  370 QTC Calculation: 453 R Axis:   -4 Text Interpretation:  Sinus rhythm Consider anterior  infarct No significant change since last tracing Confirmed by Maryan Rued  MD, Loree Fee ([id number]) on 12/21/2015 8:24:34 AM       Radiology Dg Duanne Limerick  W/kub  Result Date: 12/06/2015 CLINICAL DATA:  Right upper quadrant and epigastric pain. Prior gastric bypass. EXAM: UPPER GI SERIES WITH KUB TECHNIQUE: After obtaining a scout radiograph a routine upper GI series was performed using thin barium FLUOROSCOPY TIME:  Radiation Exposure Index (as provided by the fluoroscopic device): 86 d Gy cm2 If the device does not provide the exposure index: Fluoroscopy Time (in minutes and seconds):  2 minutes 6 seconds Number of Acquired Images:  15 COMPARISON:  None. FINDINGS: Fluoroscopic evaluation of swallowing shows disruption of all primary esophageal peristaltic waves with full column stasis. No esophageal stricture or fold thickening. The patient is status post gastric bypass. Prompt emptying of contrast from the stomach into the small bowel. No ulceration or mass. Proximal small bowel unremarkable. IMPRESSION: Status post gastric bypass with small gastric remnant unremarkable. Prompt emptying of the gastric remnant into normal appearing proximal small bowel. Esophageal dysmotility with full column stasis. Electronically Signed   By: Rolm Baptise M.D.   On: 12/06/2015 09:56    Procedures Procedures (including critical care time)  Medications Ordered in ED Medications  sodium chloride 0.9 % bolus 1,000 mL (not administered)  pantoprazole (PROTONIX) 80 mg in sodium chloride 0.9 % 100 mL IVPB (not administered)  pantoprazole (PROTONIX) 80 mg in sodium chloride 0.9 % 250 mL (0.32 mg/mL) infusion (not administered)  pantoprazole (PROTONIX) injection 40 mg (not administered)  0.9 %  sodium chloride infusion (not administered)     Initial Impression / Assessment and Plan / ED Course  I have reviewed the triage vital signs and the nursing notes.  Pertinent labs & imaging results that were available during my care of  the patient were reviewed by me and considered in my medical decision making (see  chart for details).  Clinical Course   Patient is a 65 year old female with a history of gastric bypass presenting today with upper GI bleeding. She has had multiple emesis of bright red blood. Upon arrival patient is pale, diaphoretic, hypotensive and tachycardic. She is able to answer questions and is currently maintaining her airway. Patient was placed in Trendelenburg and started on IV fluids with improvement of blood pressure to 100. However after 1 L of IV fluid patient's pressure dropped to the 80s. She was given emergency release blood 2 units. Multiple attempts for NG tube placement were unsuccessful. When patient taken off oxygen she desats to the 80s. She is complaining of ongoing upper abdominal pain and thinks it might be a little bit worse. She has been seen by Dr. [name] for this and was recently referred to Dr. Lucia Gaskins however there is no definitive cause for her upper abdominal pain. Patient denies any alcohol use but does take meloxicam. No history of GI bleeding or esophageal varices. Initial lactate is 5.9. Initial hemoglobin is 10. Patient has evidence of chronic renal disease with a creatinine of 2. Patient was typed and screened. Dr. Collene Mares GI will come and evaluate the patient. She was started on a Protonix drip. Discussed case with critical care who will admit the patient. Patient will need endoscopy. After first unit of O- patient's blood pressure to the low 100s.   Final Clinical Impressions(s) / ED Diagnoses   Final diagnoses:  Upper GI bleed  Hypotension, unspecified hypotension type    New Prescriptions New Prescriptions   No medications on file   CRITICAL CARE Performed by: Blanchie Dessert Total critical care time: 45 minutes Critical care time was exclusive of separately billable procedures and treating other patients. Critical care was necessary to treat or prevent imminent or  life-threatening deterioration. Critical care was time spent personally by me on the following activities: development of treatment plan with patient and/or surrogate as well as nursing, discussions with consultants, evaluation of patient's response to treatment, examination of patient, obtaining history from patient or surrogate, ordering and performing treatments and interventions, ordering and review of laboratory studies, ordering and review of radiographic studies, pulse oximetry and re-evaluation of patient's condition.    Blanchie Dessert, MD 12/12/2015 [id number]    Blanchie Dessert, MD 12/03/2015 [phone]

## 2015-12-08 NOTE — Sedation Documentation (Signed)
Escorted to 2H01 via bed with monitor R groin site c/d/i

## 2015-12-08 NOTE — Progress Notes (Signed)
PRN zofran given. Will continue to monitor closely and update as needed.

## 2015-12-08 NOTE — Progress Notes (Signed)
PCCM INTERVAL PROGRESS NOTE  Patient required intubation for endoscopy for airway protection. Procedure was without complication.  Vent, sedation, imaging, ABG orders placed. Will continue to evaluate.    Georgann Housekeeper, AGACNP-BC [facility] Pulmonology/Critical Care Pager [phone] or [phone]  12/06/2015 9:53 PM

## 2015-12-08 NOTE — Progress Notes (Signed)
Attempted Art line and unsuccessful, unable to thread the catheter after hitting artery. Attempted time two by myself and times 2 by another RT per protocol. RN aware

## 2015-12-08 NOTE — Transfer of Care (Signed)
OOD intubation 

## 2015-12-08 NOTE — Op Note (Signed)
[facility] Patient Name: Lauren Mckenzie Procedure Date : 12/22/2015 MRN: [id number] Attending MD: Juanita Craver , MD Date of Birth: 07-01-1950 CSN: [id number] Age: 65 Admit Type: Inpatient Procedure:                EGD with injection of epinephrine 5 cc around GU. Indications:              Hematemesis, Post-hemorrhagic anemia Providers:                Juanita Craver, MD, Carolynn Comment, RN, William Dalton, Technician. Referring MD:             Carol Ada, MD, Dr. Sarina Ill at patient's                            bedside. Medicines:                Fentanyl drip. Complications:            No immediate complications. Estimated Blood Loss:     Estimated blood loss: none. Estimated blood loss:                            none. Procedure:                Pre-anesthesia assessment: - Prior to the                            procedure, a History and Physical was performed,                            and patient medications and allergies were                            reviewed. The patient's tolerance of previous                            anesthesia was also reviewed. She was intubated by                            anesthesia to protect the airway prior to the                            procedure. The risks and benefits of the procedure                            and the sedation options and risks were discussed                            with the patient. All questions were answered, and                            informed consent was obtained. Prior  anticoagulants: The patient has taken previous                            NSAID medication, Meloxicam and Aspirin; last dose                            was 1 day prior to procedure. ASA Grade assessment:                            IV - A patient with severe systemic disease that is                            a constant threat to life. After reviewing the          risks and benefits, the patient was deemed in                            satisfactory condition to undergo the procedure.                            After obtaining informed consent, the endoscope was                            passed under direct vision. Throughout the                            procedure, the patient's blood pressure, pulse, and                            oxygen saturations were monitored continuously. The                            EG-[id number]) scope was introduced through the                            mouth, and advanced to the jejunum. The EGD was                            extremely difficult due to abnormal anatomy. The                            patient tolerated the procedure well. Scope In: Scope Out: Findings:      The examined esophagus was widely patent and appeared normal.      There was a large ulcer in the cardia just below the Z-line; no visible       vessel was identified; the area around the ulcer was injected with 5 mL       of a 1:10,000 solution of epinephrine for drug delivery.      There were large clots in the stomach of what appaears to be the       Rou-en-Y limb; the anastamosis was partially visualized but no definite       source of bleeding could be identified      The ulcer noted in the cardia was noted on  retroflexion as well. I was       no able to identify any gastric-gastric fistula as mentioned in the CT       report. Impression:               - Normal appearing esophagus.                           - Large ulcer in the cardia below the Z-line; 5 cc                            of epinephrine injected around the ulcer.                           - Large clots in the stomach-very limted                            visualization.                           - No specimens collected. Moderate Sedation:      Moderate (conscious) sedation was administered by the ICU nurse and       supervised by the endoscopist. The following  parameters were monitored:       oxygen saturation, heart rate, blood pressure, respiratory rate, EKG,       adequacy of pulmonary ventilation, and response to care. Total physician       intraservice time was 20 minutes. Recommendation:           - Refer to a surgeon ASAP.                           - Continue supportive care.                           - Code status and prognosis discussed with her POA                           - NPO today.                           - Continue present medications. Procedure Code(s):        --- Professional ---                           [phone], Esophagogastroduodenoscopy, flexible,                            transoral; with directed submucosal injection(s),                            any substance Diagnosis Code(s):        --- Professional ---                           K92.0, Hematemesis                           D62, Acute posthemorrhagic anemia CPT copyright  2016 American Medical Association. All rights reserved. The codes documented in this report are preliminary and upon coder review may  be revised to meet current compliance requirements. Juanita Craver, MD Juanita Craver, MD 12/23/2015 10:07:35 PM This report has been signed electronically. Number of Addenda: 0

## 2015-12-08 NOTE — ED Triage Notes (Signed)
To ED from home via [facility] with c/o woke up with vomiting bright red blood. Hx of stomach gastric bypass-- 20 yrs ago.   Seed notes

## 2015-12-08 NOTE — Op Note (Signed)
[facility] Patient Name: Lauren Mckenzie Procedure Date : 12/25/2015 MRN: [id number] Attending MD: Juanita Craver , MD Date of Birth: Dec 01, 1950 CSN: [id number] Age: 65 Admit Type: Inpatient Procedure:                Diagnostic EGD. Indications:              Hematemesis, Acute post-hemorrhagic anemia,                            Dysphagia.. Providers:                Juanita Craver, MD, Carolynn Comment, RN, Elspeth Cho, technician. Referring MD:             Alphonsa Overall, MD, Carol Ada, MD Medicines:                Fentanyl 50 micrograms & Midazolam 4 mg IV. Complications:            No immediate complications. Estimated Blood Loss:     Estimated blood loss: none. Procedure:                Pre-anesthesia assessment: - Prior to the                            procedure, a history and physical was performed,                            and patient medications and allergies were                            reviewed. The patient's tolerance of previous                            anesthesia was also reviewed. The risks and                            benefits of the procedure and the sedation options                            and risks were discussed with the patient. All                            questions were answered, and informed consent was                            obtained. Prior Anticoagulants: The patient has                            taken aspirin, last dose was 1 day prior to                            procedure. ASA Grade assessment: III - A patient  with severe systemic disease. After reviewing the                            risks and benefits, the patient was deemed in                            satisfactory condition to undergo the procedure.                            After obtaining informed consent, the endoscope was                            passed under direct vision. Throughout the        procedure, the patient's blood pressure, pulse, and                            oxygen saturations were monitored continuously. The                            EG-[id number]) scope was introduced through the                            mouth, and advanced to the duodenal bulb. The EGD                            performed with moderate difficulty due to excessive                            bleeding. Completion of the procedure was aided by                            lavage. The patient tolerated the procedure well. Scope In: Scope Out: Findings:      The examined esophagus was widely patent and normal; there were no       strictures, esophagitis or webs noted; there was a ?M-W tear va GU at       the GEJ      One large cratered gastric ulcer with noted at the gastroesophageal       junction; there was no visible vessel identified; large clots were noted       in the cardia and along the greater curbature of the stomach making       visualization very limited; I was unable to appreciate any post-op       changes after her gastric bypass; retroflexion revealed a ?M-W tear at       the GEJ.      There was a large clot noted in the duodenal bulb with what appeared to       a bleeding ulcer underlying it; fresh blood was noted oozing around the       clot; no definite visible vessel was identified; blood clots in the       post-bulbar region made visualization of this area impossible. Impression:               - Normal appearing esophagus; ?M-W tear at GEJ at  40 cm.                           - Large gastric ulcer in the cardia with no visible                            vessel noted; multiple clots in the                            stomch-visualization was very limited.                           - ?Large ulcer in the duodenal ulcer with large                            clots in the duodenal bulb and proximal small bowel.                           - No specimens  collected. Moderate Sedation:      Moderate (conscious) sedation was administered by the endoscopy nurse       and supervised by the endoscopist. The following parameters were       monitored: oxygen saturation, heart rate, blood pressure, respiratory       rate, EKG, adequacy of pulmonary ventilation, and response to care.       Total physician intraservice time was 11 minutes. Recommendation:           - NPO for now.                           - Continue present medications.                           - Continue serial CBC's-transfuse as needed.                           - IR consult requested; I spoke to Dr. Jacqualyn Posey                            today-he will see the patient ASAP. Procedure Code(s):        --- Professional ---                           [phone], Esophagogastroduodenoscopy, flexible,                            transoral; diagnostic, including collection of                            specimen(s) by brushing or washing, when performed                            (separate procedure) Diagnosis Code(s):        --- Professional ---                           K92.0, Hematemesis  D62, Acute posthemorrhagic anemia                           R13.10, Dysphagia, unspecified                           K25.9, Gastric ulcer, unspecified as acute or                            chronic, without hemorrhage or perforation CPT copyright 2016 American Medical Association. All rights reserved. The codes documented in this report are preliminary and upon coder review may  be revised to meet current compliance requirements. Juanita Craver, MD Juanita Craver, MD 12/01/2015 11:48:48 AM This report has been signed electronically. Number of Addenda: 0

## 2015-12-08 NOTE — Procedures (Signed)
Interventional Radiology Procedure Note  Procedure: Limited mesenteric angiogram, given presence of extensive barium through bowel/colon.   Empiric embolization of the GDA for findings on endoscopy of acute duodenal hemorrhage.  Exoseal for closure of R CFA.   Findings:  Extensive retained enteric contrast.  This limits a comprehensive mesenteric angiogram.   Typical anatomy of celiac artery.  Patent celiac, splenic, common hepatic artery.  Small left gastric.   No extravasation, pseudoaneursym, contrast pooling, or tumor blush.    Empiric embo of GDA for the scope findings.  No continued flow through the GDA at completion.    Complications: None Recommendations:  - Right hip straight for 4 hours - Adequate hydration for elevated contrast, decrease risk of CIN - Continue ICU care and resuscitation.  - Serial H&H - Routine w3ound care   Signed,  Dulcy Fanny. Earleen Newport, DO

## 2015-12-08 NOTE — Consult Note (Addendum)
Reason for Consult:UGI bleed Referring Physician: Dr Linde Gillis is an 65 y.o. female.  HPI: 65 year old morbidly obese female with a reported history of a open Roux-en-Y gastric bypass in 2003 at [facility] (confirmed with fiance) presented to the emergency room earlier today with hematemesis. She has a history of chronic abdominal pain and underwent endoscopy recently. Reportedly she had sudden onset of bright red blood in her vomit this morning. Fiance noted that her abdominal pain was no worse in the past few days. She had not had blood per rectum. She was hypotensive and confused in the emergency room. Her blood pressure improved with saline and blood.  She underwent upper endoscopy this morning by Dr. Collene Mares with the following findings:  "The examined esophagus was widely patent and normal; there were no strictures, esophagitis or webs noted; there was a ?M-W tear va GU at the GEJ  One large cratered gastric ulcer with noted at the gastroesophageal junction; there was no visible vessel identified; large clots were noted in the cardia and along the greater curbature of the stomach making visualization very limited; I was unable to appreciate any post-op changes after her gastric bypass; retroflexion revealed a ?M-W tear at the GEJ. There was a large clot noted in the duodenal bulb with what appeared to a bleeding ulcer underlying it; fresh blood was noted oozing around the clot; no definite visible vessel was identified; blood clots in the post-bulbar region made visualization of this area impossible."  She then went to interventional radiology where she underwent empiric embolization of her gastroduodenal artery.  This evening she had recurrent hematemesis and dropped her pressure and gastroenterology was recalled. She was intubated to protect airway. Dr. Collene Mares with gastroenterology called me as she was planning to repeat an upper endoscopy and was not optimistic there would be  much she could offer the patient.  On arrival the patient was intubated and sedated with an upper endoscope being performed. She was on norepinephrine. She was hypertensive at this point. She appeared to have an ulcer in her gastric pouch. There is no active bleeding or visible vessel. She had a clot in her distal pouch extending through her anastomosis into her-Roux limb. This is how it appeared endoscopically to me. I saw no evidence of a remnant stomach on endoscopy.  She had been in [facility] in April with upper abdominal pain. There were some mildly elevated LFTs and she was found to have a dilated common bile duct on imaging. She has a history of cholecystectomy as well. 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 MRCP during that admission showed a mildly increased caliber of the common bile duct but no obstructing stone or mass is visualized and she was discharged. Apparently she had ongoing upper abdominal pain. On admission to the hospital in April she was on voltaren and meloxicam was added on discharge. It is unclear if she has been taking both these meds since she is intubated and can't provide history.  She had an upper GI on September 8 which showed no evidence of a gastric gastric fistula, she had prompt emptying of contrast into her room limb without evidence of stricture.  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  She has had 3 units of blood  so far today and is in the process of getting her fourth unit now  Past Medical History:  Diagnosis Date  . Abdominal pain     status post gastric bypass for bariatric surgical   management.  . Anemia   . Anxiety   . Anxiety and depression   . Depression   . Hyperlipidemia   . Hypertension   . Hypokalemia    . Hypothyroid   . Prediabetes   . S/P gastric bypass     Past Surgical History:  Procedure Laterality Date  . CERVICAL FUSION     C6-7  . CHOLECYSTECTOMY    . GASTRIC BYPASS    . IR GENERIC HISTORICAL  12/05/2015   IR US GUIDE VASC ACCESS RIGHT 12/27/2015 Corrie Mckusick, DO [facility]  . IR GENERIC HISTORICAL  12/26/2015   IR ANGIOGRAM FOLLOW UP STUDY 12/11/2015 Corrie Mckusick, DO [facility]  . IR GENERIC HISTORICAL  12/28/2015   IR ANGIOGRAM SELECTIVE EACH ADDITIONAL VESSEL 12/22/2015 Corrie Mckusick, DO [facility]  . IR GENERIC HISTORICAL  12/27/2015   IR FLUORO GUIDE CV LINE RIGHT 12/12/2015 Corrie Mckusick, DO [facility]  . IR GENERIC HISTORICAL  12/23/2015   IR US GUIDE VASC ACCESS RIGHT 12/19/2015 Corrie Mckusick, DO [facility]  . IR GENERIC HISTORICAL  12/07/2015   IR ANGIOGRAM VISCERAL SELECTIVE 12/04/2015 Corrie Mckusick, DO [facility]  . IR GENERIC HISTORICAL  11/29/2015   IR EMBO ART  VEN HEMORR LYMPH EXTRAV  INC GUIDE ROADMAPPING 11/30/2015 Corrie Mckusick, DO [facility]  . TUBAL LIGATION      Family History  Problem Relation Age of Onset  . Heart attack Mother   . Heart attack Father     Social History:  reports that she has never smoked. She has never used smokeless tobacco. She reports that she does not drink alcohol or use drugs.  Allergies:  Allergies  Allergen Reactions  . Shellfish Allergy Anaphylaxis  . Theophylline Anaphylaxis    Medications: I have reviewed the patient's current medications.  Results for orders placed or performed during the hospital encounter of 11/29/2015 (from the past 48 hour(s))  CBC with Differential/Platelet     Status: Abnormal   Collection Time: 12/08/15  8:20 AM  Result Value Ref Range   WBC 17.3 (H) 4.0 - 10.5 K/uL   RBC 3.92 3.87 - 5.11 MIL/uL   Hemoglobin 10.5 (L) 12.0 - 15.0 g/dL   HCT 34.9 (L) 36.0 - 46.0 %   MCV 89.0 78.0 - 100.0 fL   MCH 26.8 26.0 - 34.0 pg   MCHC 30.1 30.0 - 36.0 g/dL   RDW 13.0 11.5 - 15.5 %    Platelets 577 (H) 150 - 400 K/uL   Neutrophils Relative % 52 %   Lymphocytes Relative 39 %   Monocytes Relative 5 %   Eosinophils Relative 4 %   Basophils Relative 0 %   Neutro Abs 9.0 (H) 1.7 - 7.7 K/uL   Lymphs Abs 6.7 (H) 0.7 - 4.0 K/uL   Monocytes Absolute 0.9 0.1 - 1.0 K/uL   Eosinophils Absolute 0.7 0.0 - 0.7 K/uL   Basophils Absolute 0.0 0.0 - 0.1 K/uL   WBC Morphology ABSOLUTE LYMPHOCYTOSIS   Comprehensive metabolic panel     Status: Abnormal   Collection Time: 12/08/15  8:20 AM  Result Value Ref Range   Sodium 137 135 - 145 mmol/L   Potassium 4.4 3.5 - 5.1 mmol/L   Chloride 100 (L) 101 - 111 mmol/L  CO2 24 22 - 32 mmol/L   Glucose, Bld 249 (H) 65 - 99 mg/dL   BUN 24 (H) 6 - 20 mg/dL   Creatinine, Ser 2.14 (H) 0.44 - 1.00 mg/dL   Calcium 9.2 8.9 - 10.3 mg/dL   Total Protein 5.4 (L) 6.5 - 8.1 g/dL   Albumin 2.6 (L) 3.5 - 5.0 g/dL   AST 18 15 - 41 U/L   ALT 7 (L) 14 - 54 U/L   Alkaline Phosphatase 92 38 - 126 U/L   Total Bilirubin 0.3 0.3 - 1.2 mg/dL   GFR calc non Af Amer 23 (L) >60 mL/min   GFR calc Af Amer 27 (L) >60 mL/min    Comment: (NOTE) The eGFR has been calculated using the CKD EPI equation. This calculation has not been validated in all clinical situations. eGFR's persistently <60 mL/min signify possible Chronic Kidney Disease.    Anion gap 13 5 - 15  Lipase, blood     Status: None   Collection Time: 12/27/2015  8:20 AM  Result Value Ref Range   Lipase 37 11 - 51 U/L  Protime-INR     Status: None   Collection Time: 12/03/2015  8:20 AM  Result Value Ref Range   Prothrombin Time 14.7 11.4 - 15.2 seconds   INR 1.15   APTT     Status: None   Collection Time: 12/01/2015  8:20 AM  Result Value Ref Range   aPTT 28 24 - 36 seconds  Type and screen     Status: None (Preliminary result)   Collection Time: 12/08/15  8:20 AM  Result Value Ref Range   ABO/RH(D) O POS    Antibody Screen NEG    Sample Expiration 12/11/2015    Unit Number [id number]    Blood  Component Type RED CELLS,LR    Unit division 00    Status of Unit ISSUED    Unit tag comment VERBAL ORDERS PER DR PLUNCKETT    Transfusion Status OK TO TRANSFUSE    Crossmatch Result COMPATIBLE    Unit Number [id number]    Blood Component Type RED CELLS,LR    Unit division 00    Status of Unit REL FROM [facility]    Unit tag comment VERBAL ORDERS PER DR PLUNCKETT    Transfusion Status OK TO TRANSFUSE    Crossmatch Result NOT NEEDED    Unit Number [id number]    Blood Component Type RED CELLS,LR    Unit division 00    Status of Unit ISSUED    Transfusion Status OK TO TRANSFUSE    Crossmatch Result Compatible    Unit Number [id number]    Blood Component Type RED CELLS,LR    Unit division 00    Status of Unit ISSUED    Transfusion Status OK TO TRANSFUSE    Crossmatch Result Compatible    Unit Number [id number]    Blood Component Type RED CELLS,LR    Unit division 00    Status of Unit ALLOCATED    Transfusion Status OK TO TRANSFUSE    Crossmatch Result Compatible    Unit Number [id number]    Blood Component Type RED CELLS,LR    Unit division 00    Status of Unit ALLOCATED    Transfusion Status OK TO TRANSFUSE    Crossmatch Result Compatible    Unit Number [id number]    Blood Component Type RED CELLS,LR    Unit division 00    Status of Unit ALLOCATED    Transfusion  Status OK TO TRANSFUSE    Crossmatch Result Compatible    Unit Number [id number]    Blood Component Type RED CELLS,LR    Unit division 00    Status of Unit ISSUED    Transfusion Status OK TO TRANSFUSE    Crossmatch Result Compatible   I-stat troponin, ED     Status: None   Collection Time: 12/07/2015  8:28 AM  Result Value Ref Range   Troponin i, poc 0.01 0.00 - 0.08 ng/mL   Comment 3            Comment: Due to the release kinetics of cTnI, a negative result within the first hours of the onset of symptoms does not rule out myocardial infarction with certainty. If myocardial infarction is  still suspected, repeat the test at appropriate intervals.   I-stat chem 8, ed     Status: Abnormal   Collection Time: 12/16/2015  8:30 AM  Result Value Ref Range   Sodium 136 135 - 145 mmol/L   Potassium 4.3 3.5 - 5.1 mmol/L   Chloride 101 101 - 111 mmol/L   BUN 26 (H) 6 - 20 mg/dL   Creatinine, Ser 2.00 (H) 0.44 - 1.00 mg/dL   Glucose, Bld 243 (H) 65 - 99 mg/dL   Calcium, Ion 1.13 (L) 1.15 - 1.40 mmol/L   TCO2 24 0 - 100 mmol/L   Hemoglobin 11.2 (L) 12.0 - 15.0 g/dL   HCT 33.0 (L) 36.0 - 46.0 %  I-Stat CG4 Lactic Acid, ED     Status: Abnormal   Collection Time: 12/07/2015  8:30 AM  Result Value Ref Range   Lactic Acid, Venous 5.91 (HH) 0.5 - 1.9 mmol/L   Comment NOTIFIED PHYSICIAN   Prepare RBC     Status: None   Collection Time: 12/10/2015  9:10 AM  Result Value Ref Range   Order Confirmation ORDER PROCESSED BY BLOOD BANK   MRSA PCR Screening     Status: None   Collection Time: 11/29/2015 10:33 AM  Result Value Ref Range   MRSA by PCR NEGATIVE NEGATIVE    Comment:        The GeneXpert MRSA Assay (FDA approved for NASAL specimens only), is one component of a comprehensive MRSA colonization surveillance program. It is not intended to diagnose MRSA infection nor to guide or monitor treatment for MRSA infections.   Prepare RBC     Status: None   Collection Time: 12/26/2015 11:30 AM  Result Value Ref Range   Order Confirmation ORDER PROCESSED BY BLOOD BANK   Basic metabolic panel     Status: Abnormal   Collection Time: 12/21/2015  6:09 PM  Result Value Ref Range   Sodium 138 135 - 145 mmol/L   Potassium 5.1 3.5 - 5.1 mmol/L   Chloride 106 101 - 111 mmol/L   CO2 25 22 - 32 mmol/L   Glucose, Bld 102 (H) 65 - 99 mg/dL   BUN 28 (H) 6 - 20 mg/dL   Creatinine, Ser 1.70 (H) 0.44 - 1.00 mg/dL   Calcium 8.4 (L) 8.9 - 10.3 mg/dL   GFR calc non Af Amer 30 (L) >60 mL/min   GFR calc Af Amer 35 (L) >60 mL/min    Comment: (NOTE) The eGFR has been calculated using the CKD EPI  equation. This calculation has not been validated in all clinical situations. eGFR's persistently <60 mL/min signify possible Chronic Kidney Disease.    Anion gap 7 5 - 15  CBC  Status: Abnormal   Collection Time: 12/07/2015  6:09 PM  Result Value Ref Range   WBC 14.4 (H) 4.0 - 10.5 K/uL   RBC 4.00 3.87 - 5.11 MIL/uL   Hemoglobin 11.1 (L) 12.0 - 15.0 g/dL   HCT 24.0 (L) 01.8 - 09.7 %   MCV 87.0 78.0 - 100.0 fL   MCH 27.8 26.0 - 34.0 pg   MCHC 31.9 30.0 - 36.0 g/dL   RDW 04.4 92.5 - 24.1 %   Platelets 316 150 - 400 K/uL  Troponin I (q 6hr x 3)     Status: Abnormal   Collection Time: 12/20/2015  6:09 PM  Result Value Ref Range   Troponin I 0.04 (HH) <0.03 ng/mL    Comment: CRITICAL RESULT CALLED TO, READ BACK BY AND VERIFIED WITH: RN BEASLEY,M AT [id number] MARTINB   Lactic acid, plasma     Status: None   Collection Time: 12/21/2015  6:10 PM  Result Value Ref Range   Lactic Acid, Venous 1.9 0.5 - 1.9 mmol/L  I-STAT 3, arterial blood gas (G3+)     Status: Abnormal   Collection Time: 12/28/2015  7:04 PM  Result Value Ref Range   pH, Arterial 7.330 (L) 7.350 - 7.450   pCO2 arterial 42.9 32.0 - 48.0 mmHg   pO2, Arterial 510.0 (H) 83.0 - 108.0 mmHg   Bicarbonate 22.7 20.0 - 28.0 mmol/L   TCO2 24 0 - 100 mmol/L   O2 Saturation 100.0 %   Acid-base deficit 3.0 (H) 0.0 - 2.0 mmol/L   Patient temperature 97.7 F    Collection site RADIAL, ALLEN'S TEST ACCEPTABLE    Drawn by Operator    Sample type ARTERIAL   CBC     Status: Abnormal   Collection Time: 12/24/2015  7:30 PM  Result Value Ref Range   WBC 21.5 (H) 4.0 - 10.5 K/uL   RBC 3.47 (L) 3.87 - 5.11 MIL/uL   Hemoglobin 9.6 (L) 12.0 - 15.0 g/dL   HCT 95.4 (L) 24.8 - 14.4 %   MCV 87.0 78.0 - 100.0 fL   MCH 27.7 26.0 - 34.0 pg   MCHC 31.8 30.0 - 36.0 g/dL   RDW 39.2 65.9 - 97.8 %   Platelets 424 (H) 150 - 400 K/uL  I-STAT, chem 8     Status: Abnormal   Collection Time: 12/28/2015  7:32 PM  Result Value Ref Range   Sodium 139 135  - 145 mmol/L   Potassium 4.9 3.5 - 5.1 mmol/L   Chloride 105 101 - 111 mmol/L   BUN 30 (H) 6 - 20 mg/dL   Creatinine, Ser 7.76 (H) 0.44 - 1.00 mg/dL   Glucose, Bld 548 (H) 65 - 99 mg/dL   Calcium, Ion 6.88 (L) 1.15 - 1.40 mmol/L   TCO2 23 0 - 100 mmol/L   Hemoglobin 9.9 (L) 12.0 - 15.0 g/dL   HCT 52.0 (L) 74.0 - 97.9 %  Protime-INR     Status: Abnormal   Collection Time: 12/25/2015  9:00 PM  Result Value Ref Range   Prothrombin Time 17.8 (H) 11.4 - 15.2 seconds   INR 1.45   APTT     Status: None   Collection Time: 12/18/2015  9:00 PM  Result Value Ref Range   aPTT 29 24 - 36 seconds  Basic metabolic panel     Status: Abnormal   Collection Time:   9:00 PM  Result Value Ref Range   Sodium 134 (L) 135 - 145 mmol/L  Potassium 4.9 3.5 - 5.1 mmol/L   Chloride 110 101 - 111 mmol/L   CO2 18 (L) 22 - 32 mmol/L   Glucose, Bld 323 (H) 65 - 99 mg/dL   BUN 27 (H) 6 - 20 mg/dL   Creatinine, Ser 1.55 (H) 0.44 - 1.00 mg/dL   Calcium 6.3 (LL) 8.9 - 10.3 mg/dL    Comment: DELTA CHECK NOTED CRITICAL RESULT CALLED TO, READ BACK BY AND VERIFIED WITH: OKEEFE S,RN 12/04/2015 2212 WAYK    GFR calc non Af Amer 34 (L) >60 mL/min   GFR calc Af Amer 39 (L) >60 mL/min    Comment: (NOTE) The eGFR has been calculated using the CKD EPI equation. This calculation has not been validated in all clinical situations. eGFR's persistently <60 mL/min signify possible Chronic Kidney Disease.    Anion gap 6 5 - 15  CBC with Differential/Platelet     Status: Abnormal (Preliminary result)   Collection Time: 12/20/2015  9:00 PM  Result Value Ref Range   WBC 17.5 (H) 4.0 - 10.5 K/uL   RBC 3.20 (L) 3.87 - 5.11 MIL/uL   Hemoglobin 9.0 (L) 12.0 - 15.0 g/dL   HCT 27.8 (L) 36.0 - 46.0 %   MCV 86.9 78.0 - 100.0 fL   MCH 28.1 26.0 - 34.0 pg   MCHC 32.4 30.0 - 36.0 g/dL   RDW 14.6 11.5 - 15.5 %   Platelets 443 (H) 150 - 400 K/uL   Neutrophils Relative % PENDING %   Neutro Abs PENDING 1.7 - 7.7 K/uL   Band  Neutrophils PENDING %   Lymphocytes Relative PENDING %   Lymphs Abs PENDING 0.7 - 4.0 K/uL   Monocytes Relative PENDING %   Monocytes Absolute PENDING 0.1 - 1.0 K/uL   Eosinophils Relative PENDING %   Eosinophils Absolute PENDING 0.0 - 0.7 K/uL   Basophils Relative PENDING %   Basophils Absolute PENDING 0.0 - 0.1 K/uL   WBC Morphology PENDING    RBC Morphology PENDING    Smear Review PENDING    nRBC PENDING 0 /100 WBC   Metamyelocytes Relative PENDING %   Myelocytes PENDING %   Promyelocytes Absolute PENDING %   Blasts PENDING %  Hepatic function panel     Status: Abnormal   Collection Time: 12/03/2015  9:00 PM  Result Value Ref Range   Total Protein 3.2 (L) 6.5 - 8.1 g/dL   Albumin 1.7 (L) 3.5 - 5.0 g/dL   AST 14 (L) 15 - 41 U/L   ALT 7 (L) 14 - 54 U/L   Alkaline Phosphatase 50 38 - 126 U/L   Total Bilirubin 0.4 0.3 - 1.2 mg/dL   Bilirubin, Direct <0.1 (L) 0.1 - 0.5 mg/dL   Indirect Bilirubin NOT CALCULATED 0.3 - 0.9 mg/dL  Lactic acid, plasma     Status: Abnormal   Collection Time: 12/01/2015  9:02 PM  Result Value Ref Range   Lactic Acid, Venous 2.3 (HH) 0.5 - 1.9 mmol/L    Comment: CRITICAL RESULT CALLED TO, READ BACK BY AND VERIFIED WITH: OKEEFE C,RN 12/07/2015 2207 WAYK   Prepare RBC     Status: None   Collection Time: 12/10/2015  9:13 PM  Result Value Ref Range   Order Confirmation ORDER PROCESSED BY BLOOD BANK     Ir Angiogram Visceral Selective  Result Date: 12/10/2015 INDICATION: 65 year old female with admission for acute hematemesis and upper gastrointestinal hemorrhage. Upper endoscopy has demonstrated acute hemorrhage within the duodenum, as a target for  angiogram/embolization. EXAM: IR EMBO ART VEN HEMORR LYMPH EXTRAV INC GUIDE ROADMAPPING; IR ULTRASOUND GUIDANCE VASC ACCESS RIGHT; ARTERIOGRAPHY; IR RIGHT FLOURO GUIDE CV LINE; SELECTIVE VISCERAL ARTERIOGRAPHY; ADDITIONAL ARTERIOGRAPHY ULTRASOUND-GUIDED CENTRAL VENOUS CATHETER PLACEMENT. MEDICATIONS: None  ANESTHESIA/SEDATION: Moderate (conscious) sedation was employed during this procedure. A total of Versed 1.5 mg and Fentanyl 50 mcg was administered intravenously. Moderate Sedation Time: 57 minutes. The patient's level of consciousness and vital signs were monitored continuously by radiology nursing throughout the procedure under my direct supervision. CONTRAST:  55 cc Isovue FLUOROSCOPY TIME:  Fluoroscopy Time: 17 minutes 18 seconds (1613 mGy). COMPLICATIONS: None PROCEDURE: Informed consent was obtained from the patient following explanation of the procedure, risks, benefits and alternatives. The patient understands, agrees and consents for the procedure. All questions were addressed. A time out was performed prior to the initiation of the procedure. Maximal barrier sterile technique utilized including caps, mask, sterile gowns, sterile gloves, large sterile drape, hand hygiene, and Betadine prep. Ultrasound survey of the right inguinal region was stored of the venous anatomy. After generous local anesthesia 0 was injected using 1% lidocaine, a small incision was made in the skin. A single wall needle was observed under ultrasound to enter the right common femoral vein. With venous blood flow returned, a standard 035 wire was advanced under fluoroscopy into the IVC. The needle was removed. Soft tissue dilation was performed. Triple-lumen catheter was then placed over the wire and a wire was removed. The 3 ports were flushed and sterile caps placed. Catheter was sutured in position. Ultrasound survey of the right common femoral artery was performed with images stored and sent to PACs. A single wall needle was used access the right common femoral artery under ultrasound. With excellent arterial blood flow returned, a standard 035 wire was passed through the needle, observed enter the abdominal aorta under fluoroscopy. The needle was removed. A standard 5 French vascular sheath was placed. The dilator was removed  and the sheath was flushed. Bentson wire was advanced into the abdominal aorta. A standard 5 French C2 Cobra catheter was advanced over the wire and used to select the celiac artery. Celiac angiogram was performed. Obliquities were required with limited angiogram given the presence of retained enteric contrast within the colon. Micro catheter system with 0 1 for micro wire and O2 1 lumen micro catheter were then advanced through the base catheter into the common hepatic artery. The gastroduodenal artery was selected, with dedicated angiogram performed. Given the patient's upper endoscopy findings, empiric embolization was then performed of the gastroduodenal artery through the micro catheter. Status post embolization repeat angiogram was performed with no persisting flow through the gastroduodenal artery. Angiogram of the common femoral artery access site was performed. Exoseal was deployed for hemostasis. Patient tolerated the procedure well and remained hemodynamically stable throughout. No complications were encountered and no significant blood loss encountered. FINDINGS: Ultrasound survey of the right inguinal region demonstrates patency of the right common femoral vein and the right common femoral artery. Initial images demonstrate significant retained enteric contrast through the colon, limiting the fidelity of the angiogram. Limited mesenteric angiogram of celiac artery demonstrates typical anatomy with patent splenic artery, common hepatic artery, gastroduodenal artery, and small left gastric artery. Dedicated selective and sub selective angiogram of the second and third order branches of the celiac artery demonstrate no tumor blush, extravasation of contrast, pooling of contrast, pseudoaneurysm, or arterial venous shunting. Status post coil embolization of gastroduodenal artery, there is no residual flow. IMPRESSION: Status post ultrasound-guided  right common femoral vein central venous catheter. Catheter  ready for use. Status post mesenteric angiogram with empiric embolization of gastroduodenal artery for life-threatening acute hemorrhage observed on endoscopy at the duodenum. Status post deployment of right common femoral artery Exoseal. Signed, Dulcy Fanny. Earleen Newport, DO [facility] Radiology Electronically Signed   By: Corrie Mckusick D.O.   On: 12/07/2015 15:38   Ir Angiogram Selective Each Additional Vessel  Result Date: 12/24/2015 INDICATION: 65 year old female with admission for acute hematemesis and upper gastrointestinal hemorrhage. Upper endoscopy has demonstrated acute hemorrhage within the duodenum, as a target for angiogram/embolization. EXAM: IR EMBO ART VEN HEMORR LYMPH EXTRAV INC GUIDE ROADMAPPING; IR ULTRASOUND GUIDANCE VASC ACCESS RIGHT; ARTERIOGRAPHY; IR RIGHT FLOURO GUIDE CV LINE; SELECTIVE VISCERAL ARTERIOGRAPHY; ADDITIONAL ARTERIOGRAPHY ULTRASOUND-GUIDED CENTRAL VENOUS CATHETER PLACEMENT. MEDICATIONS: None ANESTHESIA/SEDATION: Moderate (conscious) sedation was employed during this procedure. A total of Versed 1.5 mg and Fentanyl 50 mcg was administered intravenously. Moderate Sedation Time: 57 minutes. The patient's level of consciousness and vital signs were monitored continuously by radiology nursing throughout the procedure under my direct supervision. CONTRAST:  55 cc Isovue FLUOROSCOPY TIME:  Fluoroscopy Time: 17 minutes 18 seconds (1613 mGy). COMPLICATIONS: None PROCEDURE: Informed consent was obtained from the patient following explanation of the procedure, risks, benefits and alternatives. The patient understands, agrees and consents for the procedure. All questions were addressed. A time out was performed prior to the initiation of the procedure. Maximal barrier sterile technique utilized including caps, mask, sterile gowns, sterile gloves, large sterile drape, hand hygiene, and Betadine prep. Ultrasound survey of the right inguinal region  was stored of the venous anatomy. After generous local anesthesia 0 was injected using 1% lidocaine, a small incision was made in the skin. A single wall needle was observed under ultrasound to enter the right common femoral vein. With venous blood flow returned, a standard 035 wire was advanced under fluoroscopy into the IVC. The needle was removed. Soft tissue dilation was performed. Triple-lumen catheter was then placed over the wire and a wire was removed. The 3 ports were flushed and sterile caps placed. Catheter was sutured in position. Ultrasound survey of the right common femoral artery was performed with images stored and sent to PACs. A single wall needle was used access the right common femoral artery under ultrasound. With excellent arterial blood flow returned, a standard 035 wire was passed through the needle, observed enter the abdominal aorta under fluoroscopy. The needle was removed. A standard 5 French vascular sheath was placed. The dilator was removed and the sheath was flushed. Bentson wire was advanced into the abdominal aorta. A standard 5 French C2 Cobra catheter was advanced over the wire and used to select the celiac artery. Celiac angiogram was performed. Obliquities were required with limited angiogram given the presence of retained enteric contrast within the colon. Micro catheter system with 0 1 for micro wire and O2 1 lumen micro catheter were then advanced through the base catheter into the common hepatic artery. The gastroduodenal artery was selected, with dedicated angiogram performed. Given the patient's upper endoscopy findings, empiric embolization was then performed of the gastroduodenal artery through the micro catheter. Status post embolization repeat angiogram was performed with no persisting flow through the gastroduodenal artery. Angiogram of the common femoral artery access site was performed. Exoseal was deployed for hemostasis. Patient tolerated the procedure well and  remained hemodynamically stable throughout. No complications were encountered and no significant blood loss encountered. FINDINGS: Ultrasound survey of the  right inguinal region demonstrates patency of the right common femoral vein and the right common femoral artery. Initial images demonstrate significant retained enteric contrast through the colon, limiting the fidelity of the angiogram. Limited mesenteric angiogram of celiac artery demonstrates typical anatomy with patent splenic artery, common hepatic artery, gastroduodenal artery, and small left gastric artery. Dedicated selective and sub selective angiogram of the second and third order branches of the celiac artery demonstrate no tumor blush, extravasation of contrast, pooling of contrast, pseudoaneurysm, or arterial venous shunting. Status post coil embolization of gastroduodenal artery, there is no residual flow. IMPRESSION: Status post ultrasound-guided right common femoral vein central venous catheter. Catheter ready for use. Status post mesenteric angiogram with empiric embolization of gastroduodenal artery for life-threatening acute hemorrhage observed on endoscopy at the duodenum. Status post deployment of right common femoral artery Exoseal. Signed, Dulcy Fanny. Earleen Newport, DO [facility] Radiology Electronically Signed   By: Corrie Mckusick D.O.   On: 12/19/2015 15:38   Ir Angiogram Follow Up Study  Result Date: 12/10/2015 INDICATION: 65 year old female with admission for acute hematemesis and upper gastrointestinal hemorrhage. Upper endoscopy has demonstrated acute hemorrhage within the duodenum, as a target for angiogram/embolization. EXAM: IR EMBO ART VEN HEMORR LYMPH EXTRAV INC GUIDE ROADMAPPING; IR ULTRASOUND GUIDANCE VASC ACCESS RIGHT; ARTERIOGRAPHY; IR RIGHT FLOURO GUIDE CV LINE; SELECTIVE VISCERAL ARTERIOGRAPHY; ADDITIONAL ARTERIOGRAPHY ULTRASOUND-GUIDED CENTRAL VENOUS CATHETER PLACEMENT.  MEDICATIONS: None ANESTHESIA/SEDATION: Moderate (conscious) sedation was employed during this procedure. A total of Versed 1.5 mg and Fentanyl 50 mcg was administered intravenously. Moderate Sedation Time: 57 minutes. The patient's level of consciousness and vital signs were monitored continuously by radiology nursing throughout the procedure under my direct supervision. CONTRAST:  55 cc Isovue FLUOROSCOPY TIME:  Fluoroscopy Time: 17 minutes 18 seconds (1613 mGy). COMPLICATIONS: None PROCEDURE: Informed consent was obtained from the patient following explanation of the procedure, risks, benefits and alternatives. The patient understands, agrees and consents for the procedure. All questions were addressed. A time out was performed prior to the initiation of the procedure. Maximal barrier sterile technique utilized including caps, mask, sterile gowns, sterile gloves, large sterile drape, hand hygiene, and Betadine prep. Ultrasound survey of the right inguinal region was stored of the venous anatomy. After generous local anesthesia 0 was injected using 1% lidocaine, a small incision was made in the skin. A single wall needle was observed under ultrasound to enter the right common femoral vein. With venous blood flow returned, a standard 035 wire was advanced under fluoroscopy into the IVC. The needle was removed. Soft tissue dilation was performed. Triple-lumen catheter was then placed over the wire and a wire was removed. The 3 ports were flushed and sterile caps placed. Catheter was sutured in position. Ultrasound survey of the right common femoral artery was performed with images stored and sent to PACs. A single wall needle was used access the right common femoral artery under ultrasound. With excellent arterial blood flow returned, a standard 035 wire was passed through the needle, observed enter the abdominal aorta under fluoroscopy. The needle was removed. A standard 5 French vascular sheath was placed. The  dilator was removed and the sheath was flushed. Bentson wire was advanced into the abdominal aorta. A standard 5 French C2 Cobra catheter was advanced over the wire and used to select the celiac artery. Celiac angiogram was performed. Obliquities were required with limited angiogram given the presence of retained enteric contrast within the colon. Micro catheter system with 0 1 for micro  wire and O2 1 lumen micro catheter were then advanced through the base catheter into the common hepatic artery. The gastroduodenal artery was selected, with dedicated angiogram performed. Given the patient's upper endoscopy findings, empiric embolization was then performed of the gastroduodenal artery through the micro catheter. Status post embolization repeat angiogram was performed with no persisting flow through the gastroduodenal artery. Angiogram of the common femoral artery access site was performed. Exoseal was deployed for hemostasis. Patient tolerated the procedure well and remained hemodynamically stable throughout. No complications were encountered and no significant blood loss encountered. FINDINGS: Ultrasound survey of the right inguinal region demonstrates patency of the right common femoral vein and the right common femoral artery. Initial images demonstrate significant retained enteric contrast through the colon, limiting the fidelity of the angiogram. Limited mesenteric angiogram of celiac artery demonstrates typical anatomy with patent splenic artery, common hepatic artery, gastroduodenal artery, and small left gastric artery. Dedicated selective and sub selective angiogram of the second and third order branches of the celiac artery demonstrate no tumor blush, extravasation of contrast, pooling of contrast, pseudoaneurysm, or arterial venous shunting. Status post coil embolization of gastroduodenal artery, there is no residual flow. IMPRESSION: Status post ultrasound-guided right common femoral vein central venous  catheter. Catheter ready for use. Status post mesenteric angiogram with empiric embolization of gastroduodenal artery for life-threatening acute hemorrhage observed on endoscopy at the duodenum. Status post deployment of right common femoral artery Exoseal. Signed, Dulcy Fanny. Earleen Newport, DO [facility] Radiology Electronically Signed   By: Corrie Mckusick D.O.   On: 11/29/2015 15:38   Ir Fluoro Guide Cv Line Right  Result Date: 12/01/2015 INDICATION: 65 year old female with admission for acute hematemesis and upper gastrointestinal hemorrhage. Upper endoscopy has demonstrated acute hemorrhage within the duodenum, as a target for angiogram/embolization. EXAM: IR EMBO ART VEN HEMORR LYMPH EXTRAV INC GUIDE ROADMAPPING; IR ULTRASOUND GUIDANCE VASC ACCESS RIGHT; ARTERIOGRAPHY; IR RIGHT FLOURO GUIDE CV LINE; SELECTIVE VISCERAL ARTERIOGRAPHY; ADDITIONAL ARTERIOGRAPHY ULTRASOUND-GUIDED CENTRAL VENOUS CATHETER PLACEMENT. MEDICATIONS: None ANESTHESIA/SEDATION: Moderate (conscious) sedation was employed during this procedure. A total of Versed 1.5 mg and Fentanyl 50 mcg was administered intravenously. Moderate Sedation Time: 57 minutes. The patient's level of consciousness and vital signs were monitored continuously by radiology nursing throughout the procedure under my direct supervision. CONTRAST:  55 cc Isovue FLUOROSCOPY TIME:  Fluoroscopy Time: 17 minutes 18 seconds (1613 mGy). COMPLICATIONS: None PROCEDURE: Informed consent was obtained from the patient following explanation of the procedure, risks, benefits and alternatives. The patient understands, agrees and consents for the procedure. All questions were addressed. A time out was performed prior to the initiation of the procedure. Maximal barrier sterile technique utilized including caps, mask, sterile gowns, sterile gloves, large sterile drape, hand hygiene, and Betadine prep. Ultrasound survey of the right inguinal region  was stored of the venous anatomy. After generous local anesthesia 0 was injected using 1% lidocaine, a small incision was made in the skin. A single wall needle was observed under ultrasound to enter the right common femoral vein. With venous blood flow returned, a standard 035 wire was advanced under fluoroscopy into the IVC. The needle was removed. Soft tissue dilation was performed. Triple-lumen catheter was then placed over the wire and a wire was removed. The 3 ports were flushed and sterile caps placed. Catheter was sutured in position. Ultrasound survey of the right common femoral artery was performed with images stored and sent to PACs. A single wall needle was used access the right  common femoral artery under ultrasound. With excellent arterial blood flow returned, a standard 035 wire was passed through the needle, observed enter the abdominal aorta under fluoroscopy. The needle was removed. A standard 5 French vascular sheath was placed. The dilator was removed and the sheath was flushed. Bentson wire was advanced into the abdominal aorta. A standard 5 French C2 Cobra catheter was advanced over the wire and used to select the celiac artery. Celiac angiogram was performed. Obliquities were required with limited angiogram given the presence of retained enteric contrast within the colon. Micro catheter system with 0 1 for micro wire and O2 1 lumen micro catheter were then advanced through the base catheter into the common hepatic artery. The gastroduodenal artery was selected, with dedicated angiogram performed. Given the patient's upper endoscopy findings, empiric embolization was then performed of the gastroduodenal artery through the micro catheter. Status post embolization repeat angiogram was performed with no persisting flow through the gastroduodenal artery. Angiogram of the common femoral artery access site was performed. Exoseal was deployed for hemostasis. Patient tolerated the procedure well and  remained hemodynamically stable throughout. No complications were encountered and no significant blood loss encountered. FINDINGS: Ultrasound survey of the right inguinal region demonstrates patency of the right common femoral vein and the right common femoral artery. Initial images demonstrate significant retained enteric contrast through the colon, limiting the fidelity of the angiogram. Limited mesenteric angiogram of celiac artery demonstrates typical anatomy with patent splenic artery, common hepatic artery, gastroduodenal artery, and small left gastric artery. Dedicated selective and sub selective angiogram of the second and third order branches of the celiac artery demonstrate no tumor blush, extravasation of contrast, pooling of contrast, pseudoaneurysm, or arterial venous shunting. Status post coil embolization of gastroduodenal artery, there is no residual flow. IMPRESSION: Status post ultrasound-guided right common femoral vein central venous catheter. Catheter ready for use. Status post mesenteric angiogram with empiric embolization of gastroduodenal artery for life-threatening acute hemorrhage observed on endoscopy at the duodenum. Status post deployment of right common femoral artery Exoseal. Signed, Dulcy Fanny. Earleen Newport, DO [facility] Radiology Electronically Signed   By: Corrie Mckusick D.O.   On: 12/19/2015 15:38   Ir US Guide Vasc Access Right  Result Date: 12/18/2015 INDICATION: 65 year old female with admission for acute hematemesis and upper gastrointestinal hemorrhage. Upper endoscopy has demonstrated acute hemorrhage within the duodenum, as a target for angiogram/embolization. EXAM: IR EMBO ART VEN HEMORR LYMPH EXTRAV INC GUIDE ROADMAPPING; IR ULTRASOUND GUIDANCE VASC ACCESS RIGHT; ARTERIOGRAPHY; IR RIGHT FLOURO GUIDE CV LINE; SELECTIVE VISCERAL ARTERIOGRAPHY; ADDITIONAL ARTERIOGRAPHY ULTRASOUND-GUIDED CENTRAL VENOUS CATHETER PLACEMENT.  MEDICATIONS: None ANESTHESIA/SEDATION: Moderate (conscious) sedation was employed during this procedure. A total of Versed 1.5 mg and Fentanyl 50 mcg was administered intravenously. Moderate Sedation Time: 57 minutes. The patient's level of consciousness and vital signs were monitored continuously by radiology nursing throughout the procedure under my direct supervision. CONTRAST:  55 cc Isovue FLUOROSCOPY TIME:  Fluoroscopy Time: 17 minutes 18 seconds (1613 mGy). COMPLICATIONS: None PROCEDURE: Informed consent was obtained from the patient following explanation of the procedure, risks, benefits and alternatives. The patient understands, agrees and consents for the procedure. All questions were addressed. A time out was performed prior to the initiation of the procedure. Maximal barrier sterile technique utilized including caps, mask, sterile gowns, sterile gloves, large sterile drape, hand hygiene, and Betadine prep. Ultrasound survey of the right inguinal region was stored of the venous anatomy. After generous local anesthesia 0 was injected using  1% lidocaine, a small incision was made in the skin. A single wall needle was observed under ultrasound to enter the right common femoral vein. With venous blood flow returned, a standard 035 wire was advanced under fluoroscopy into the IVC. The needle was removed. Soft tissue dilation was performed. Triple-lumen catheter was then placed over the wire and a wire was removed. The 3 ports were flushed and sterile caps placed. Catheter was sutured in position. Ultrasound survey of the right common femoral artery was performed with images stored and sent to PACs. A single wall needle was used access the right common femoral artery under ultrasound. With excellent arterial blood flow returned, a standard 035 wire was passed through the needle, observed enter the abdominal aorta under fluoroscopy. The needle was removed. A standard 5 French vascular sheath was placed. The  dilator was removed and the sheath was flushed. Bentson wire was advanced into the abdominal aorta. A standard 5 French C2 Cobra catheter was advanced over the wire and used to select the celiac artery. Celiac angiogram was performed. Obliquities were required with limited angiogram given the presence of retained enteric contrast within the colon. Micro catheter system with 0 1 for micro wire and O2 1 lumen micro catheter were then advanced through the base catheter into the common hepatic artery. The gastroduodenal artery was selected, with dedicated angiogram performed. Given the patient's upper endoscopy findings, empiric embolization was then performed of the gastroduodenal artery through the micro catheter. Status post embolization repeat angiogram was performed with no persisting flow through the gastroduodenal artery. Angiogram of the common femoral artery access site was performed. Exoseal was deployed for hemostasis. Patient tolerated the procedure well and remained hemodynamically stable throughout. No complications were encountered and no significant blood loss encountered. FINDINGS: Ultrasound survey of the right inguinal region demonstrates patency of the right common femoral vein and the right common femoral artery. Initial images demonstrate significant retained enteric contrast through the colon, limiting the fidelity of the angiogram. Limited mesenteric angiogram of celiac artery demonstrates typical anatomy with patent splenic artery, common hepatic artery, gastroduodenal artery, and small left gastric artery. Dedicated selective and sub selective angiogram of the second and third order branches of the celiac artery demonstrate no tumor blush, extravasation of contrast, pooling of contrast, pseudoaneurysm, or arterial venous shunting. Status post coil embolization of gastroduodenal artery, there is no residual flow. IMPRESSION: Status post ultrasound-guided right common femoral vein central venous  catheter. Catheter ready for use. Status post mesenteric angiogram with empiric embolization of gastroduodenal artery for life-threatening acute hemorrhage observed on endoscopy at the duodenum. Status post deployment of right common femoral artery Exoseal. Signed, Dulcy Fanny. Earleen Newport, DO [facility] Radiology Electronically Signed   By: Corrie Mckusick D.O.   On: 12/01/2015 15:38   Ir US Guide Vasc Access Right  Result Date: 12/21/2015 INDICATION: 65 year old female with admission for acute hematemesis and upper gastrointestinal hemorrhage. Upper endoscopy has demonstrated acute hemorrhage within the duodenum, as a target for angiogram/embolization. EXAM: IR EMBO ART VEN HEMORR LYMPH EXTRAV INC GUIDE ROADMAPPING; IR ULTRASOUND GUIDANCE VASC ACCESS RIGHT; ARTERIOGRAPHY; IR RIGHT FLOURO GUIDE CV LINE; SELECTIVE VISCERAL ARTERIOGRAPHY; ADDITIONAL ARTERIOGRAPHY ULTRASOUND-GUIDED CENTRAL VENOUS CATHETER PLACEMENT. MEDICATIONS: None ANESTHESIA/SEDATION: Moderate (conscious) sedation was employed during this procedure. A total of Versed 1.5 mg and Fentanyl 50 mcg was administered intravenously. Moderate Sedation Time: 57 minutes. The patient's level of consciousness and vital signs were monitored continuously by radiology nursing throughout the procedure under my  direct supervision. CONTRAST:  55 cc Isovue FLUOROSCOPY TIME:  Fluoroscopy Time: 17 minutes 18 seconds (1613 mGy). COMPLICATIONS: None PROCEDURE: Informed consent was obtained from the patient following explanation of the procedure, risks, benefits and alternatives. The patient understands, agrees and consents for the procedure. All questions were addressed. A time out was performed prior to the initiation of the procedure. Maximal barrier sterile technique utilized including caps, mask, sterile gowns, sterile gloves, large sterile drape, hand hygiene, and Betadine prep. Ultrasound survey of the right inguinal region  was stored of the venous anatomy. After generous local anesthesia 0 was injected using 1% lidocaine, a small incision was made in the skin. A single wall needle was observed under ultrasound to enter the right common femoral vein. With venous blood flow returned, a standard 035 wire was advanced under fluoroscopy into the IVC. The needle was removed. Soft tissue dilation was performed. Triple-lumen catheter was then placed over the wire and a wire was removed. The 3 ports were flushed and sterile caps placed. Catheter was sutured in position. Ultrasound survey of the right common femoral artery was performed with images stored and sent to PACs. A single wall needle was used access the right common femoral artery under ultrasound. With excellent arterial blood flow returned, a standard 035 wire was passed through the needle, observed enter the abdominal aorta under fluoroscopy. The needle was removed. A standard 5 French vascular sheath was placed. The dilator was removed and the sheath was flushed. Bentson wire was advanced into the abdominal aorta. A standard 5 French C2 Cobra catheter was advanced over the wire and used to select the celiac artery. Celiac angiogram was performed. Obliquities were required with limited angiogram given the presence of retained enteric contrast within the colon. Micro catheter system with 0 1 for micro wire and O2 1 lumen micro catheter were then advanced through the base catheter into the common hepatic artery. The gastroduodenal artery was selected, with dedicated angiogram performed. Given the patient's upper endoscopy findings, empiric embolization was then performed of the gastroduodenal artery through the micro catheter. Status post embolization repeat angiogram was performed with no persisting flow through the gastroduodenal artery. Angiogram of the common femoral artery access site was performed. Exoseal was deployed for hemostasis. Patient tolerated the procedure well and  remained hemodynamically stable throughout. No complications were encountered and no significant blood loss encountered. FINDINGS: Ultrasound survey of the right inguinal region demonstrates patency of the right common femoral vein and the right common femoral artery. Initial images demonstrate significant retained enteric contrast through the colon, limiting the fidelity of the angiogram. Limited mesenteric angiogram of celiac artery demonstrates typical anatomy with patent splenic artery, common hepatic artery, gastroduodenal artery, and small left gastric artery. Dedicated selective and sub selective angiogram of the second and third order branches of the celiac artery demonstrate no tumor blush, extravasation of contrast, pooling of contrast, pseudoaneurysm, or arterial venous shunting. Status post coil embolization of gastroduodenal artery, there is no residual flow. IMPRESSION: Status post ultrasound-guided right common femoral vein central venous catheter. Catheter ready for use. Status post mesenteric angiogram with empiric embolization of gastroduodenal artery for life-threatening acute hemorrhage observed on endoscopy at the duodenum. Status post deployment of right common femoral artery Exoseal. Signed, Dulcy Fanny. Earleen Newport, DO [facility] Radiology Electronically Signed   By: Corrie Mckusick D.O.   On: 12/24/2015 15:38   Dg Chest Port 1 View  Result Date: 12/27/2015 CLINICAL DATA:  65 year old female with  a history of vomiting blood. Gastric bypass. EXAM: PORTABLE CHEST 1 VIEW COMPARISON:  07/09/2015 FINDINGS: Cardiomediastinal silhouette unchanged in size and contour. Surgical changes of the cervical region. No pneumothorax or pleural effusion. Coarsened interstitial markings, similar to the comparison study. No confluent airspace disease. No displaced fracture. IMPRESSION: Chronic lung changes without evidence of superimposed acute cardiopulmonary disease.  Signed, Dulcy Fanny. Earleen Newport, DO [facility]iology [facility] Radiology Electronically Signed   By: Corrie Mckusick D.O.   On: 12/17/2015 09:22   [place] Guide Roadmapping  Result Date: 12/28/2015 INDICATION: 66 year old female with admission for acute hematemesis and upper gastrointestinal hemorrhage. Upper endoscopy has demonstrated acute hemorrhage within the duodenum, as a target for angiogram/embolization. EXAM: IR EMBO ART VEN HEMORR LYMPH EXTRAV INC GUIDE ROADMAPPING; IR ULTRASOUND GUIDANCE VASC ACCESS RIGHT; ARTERIOGRAPHY; IR RIGHT FLOURO GUIDE CV LINE; SELECTIVE VISCERAL ARTERIOGRAPHY; ADDITIONAL ARTERIOGRAPHY ULTRASOUND-GUIDED CENTRAL VENOUS CATHETER PLACEMENT. MEDICATIONS: None ANESTHESIA/SEDATION: Moderate (conscious) sedation was employed during this procedure. A total of Versed 1.5 mg and Fentanyl 50 mcg was administered intravenously. Moderate Sedation Time: 57 minutes. The patient's level of consciousness and vital signs were monitored continuously by radiology nursing throughout the procedure under my direct supervision. CONTRAST:  55 cc Isovue FLUOROSCOPY TIME:  Fluoroscopy Time: 17 minutes 18 seconds (1613 mGy). COMPLICATIONS: None PROCEDURE: Informed consent was obtained from the patient following explanation of the procedure, risks, benefits and alternatives. The patient understands, agrees and consents for the procedure. All questions were addressed. A time out was performed prior to the initiation of the procedure. Maximal barrier sterile technique utilized including caps, mask, sterile gowns, sterile gloves, large sterile drape, hand hygiene, and Betadine prep. Ultrasound survey of the right inguinal region was stored of the venous anatomy. After generous local anesthesia 0 was injected using 1% lidocaine, a small incision was made in the skin. A single wall needle was observed under ultrasound to enter the right common femoral  vein. With venous blood flow returned, a standard 035 wire was advanced under fluoroscopy into the IVC. The needle was removed. Soft tissue dilation was performed. Triple-lumen catheter was then placed over the wire and a wire was removed. The 3 ports were flushed and sterile caps placed. Catheter was sutured in position. Ultrasound survey of the right common femoral artery was performed with images stored and sent to PACs. A single wall needle was used access the right common femoral artery under ultrasound. With excellent arterial blood flow returned, a standard 035 wire was passed through the needle, observed enter the abdominal aorta under fluoroscopy. The needle was removed. A standard 5 French vascular sheath was placed. The dilator was removed and the sheath was flushed. Bentson wire was advanced into the abdominal aorta. A standard 5 French C2 Cobra catheter was advanced over the wire and used to select the celiac artery. Celiac angiogram was performed. Obliquities were required with limited angiogram given the presence of retained enteric contrast within the colon. Micro catheter system with 0 1 for micro wire and O2 1 lumen micro catheter were then advanced through the base catheter into the common hepatic artery. The gastroduodenal artery was selected, with dedicated angiogram performed. Given the patient's upper endoscopy findings, empiric embolization was then performed of the gastroduodenal artery through the micro catheter. Status post embolization repeat angiogram was performed with no persisting flow through the gastroduodenal artery. Angiogram of the common femoral artery access site was performed. Exoseal was deployed for hemostasis.  Patient tolerated the procedure well and remained hemodynamically stable throughout. No complications were encountered and no significant blood loss encountered. FINDINGS: Ultrasound survey of the right inguinal region demonstrates patency of the right common femoral  vein and the right common femoral artery. Initial images demonstrate significant retained enteric contrast through the colon, limiting the fidelity of the angiogram. Limited mesenteric angiogram of celiac artery demonstrates typical anatomy with patent splenic artery, common hepatic artery, gastroduodenal artery, and small left gastric artery. Dedicated selective and sub selective angiogram of the second and third order branches of the celiac artery demonstrate no tumor blush, extravasation of contrast, pooling of contrast, pseudoaneurysm, or arterial venous shunting. Status post coil embolization of gastroduodenal artery, there is no residual flow. IMPRESSION: Status post ultrasound-guided right common femoral vein central venous catheter. Catheter ready for use. Status post mesenteric angiogram with empiric embolization of gastroduodenal artery for life-threatening acute hemorrhage observed on endoscopy at the duodenum. Status post deployment of right common femoral artery Exoseal. Signed, Dulcy Fanny. Earleen Newport, DO [facility] Electronically Signed   By: Corrie Mckusick D.O.   On: 12/27/2015 15:38    Review of Systems  Unable to perform ROS: Intubated   Blood pressure (!) 116/95, pulse 100, temperature 97.9 F (36.6 C), temperature source Axillary, resp. rate 11, height '5\' 5"'$  (1.651 m), weight 95.5 kg (210 lb 8.6 oz), SpO2 100 %. Physical Exam  Constitutional: She appears well-developed.  Pale, morbidly obese  HENT:  Head: Normocephalic and atraumatic.  Right Ear: External ear normal.  Left Ear: External ear normal.  Eyes: Conjunctivae are normal. Pupils are equal, round, and reactive to light.  Neck: Normal range of motion. No tracheal deviation present. No thyromegaly present.  Cardiovascular: Normal rate and normal heart sounds.   Respiratory: Breath sounds normal. No respiratory distress. She has no wheezes.  GI: Soft. She exhibits no  distension. There is no tenderness. There is no rebound.  Morbidly obese, soft, old midline incision  Musculoskeletal: She exhibits no edema.  Neurological:  Intubated/sedated  Skin: There is pallor.  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 She is more than likely bleeding from a marginal ulcer at her gastrojejunal anastomosis. She has several reasons to have a marginal ulcer namely chronic NSAID use and potential gastric gastric fistula. On CT imaging in April she had oral contrast in her gastric pouch as well as in her excluded stomach. The fianc reports over the past 5 weeks she has had upper abdominal pain which is worsened with oral intake and that she will regurgitate shortly after eating. She had an upper GI on September 8 which demonstrated no obvious gastric gastric fistula but her CT scan in April is highly concerning for a GG fistula.  The endoscopy performed this morning I was not present for. I do not believe the gastroenterologist was in the duodenal bulb. In my opinion, in order for the GI doctor to have been in the duodenal bulb then there would have to be a large patent G-G fistula. IF it was large enough to accommodate a EGD scope then it should be clearly evident on UGI which it wasn't.  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  The endoscopy I  witnessed this evening was just impressive for clot around her gastrojejunal anastomosis. There is evidence of an ulcer in the pouch without any stigmata of active bleeding or visible vessel.  Approaching this surgically would be fraught with issues. She has mesh around her upper midline with metal tacks. Therefore just getting into her abdomen  would be challenging without having an enterotomy. Moreover emergent surgery would involve taking down her gastric jejunal anastomosis and her potential gastric gastric fistula. This may all have to be done ultimately if she does in fact have a gastric gastric fistula. I don't think the endoscope this morning was able to traverse the gastric gastric fistula and got into the duodenal bulb since the gastric gastric fistula was not visible on her upper GI just 2 days ago. I think the intestine the gastroenterologist saw was just in fact the Roux limb  Maintain type and cross q 8 CBCs, daily coags Protonix gtt Daily thiamine Check CTA abdomen pelvis to rule out other causes of GI bleed  Ideally this would be best managed emergently-urgently endoscopically. [facility] endoscopic capabilities.  Discussed with fianc  If transferred to [facility] - I would send CD of imaging this admission along with UGI from 9/8 and CT abd/pelvis from 06/2015  Total critical care time 80 minutes - reviewing labs, imaging results, coordinating care, managing hypotension  Leighton Ruff. Redmond Pulling, MD, FACS General, Bariatric, & Minimally Invasive Surgery [facility] Surgery, [place]     [facility] M 12/10/2015, 10:22 PM

## 2015-12-08 NOTE — ED Notes (Signed)
Attempted to place NGT in right nares x 3 -- used cetacaine to numb throat, without success. Will wait for Dr. Collene Mares to see pt before attempting again .

## 2015-12-08 NOTE — H&P (Signed)
PULMONARY / CRITICAL CARE MEDICINE  12/22/2015 Name: Lauren Mckenzie MRN: [id number] DOB: 07-03-1950    ADMISSION DATE:  12/23/2015 CONSULTATION DATE:  12/22/2015  REFERRING MD:  Dr. Maryan Rued, ER physician  CHIEF COMPLAINT:  Vomiting blood  HISTORY OF PRESENT ILLNESS:   65 year old female with a past medical history significant for hypertension, chronic kidney disease, anxiety, gastric bypass came to the emergency room on 12/08/2015 with the sudden onset of vomiting blood. Her fianc provides most of the history because she is quite fatigued and somewhat confused. He states that she had the sudden onset of bright red blood this morning and so they came to the emergency room. She had several episodes of vomiting blood. She has had chronic abdominal pain and has undergone endoscopy in the past. She was recently treated with meloxicam to see if this would help with her abdominal pain. She was to see a specialist at [facility] for further evaluation. Her fianc notes that her belly pain was no worse in the last few days and it is at baseline. She has not passed blood per rectum. In the emergency department she was noted to be hypotensive and somewhat confused. Her blood pressure has improved with saline and blood. GI medicine was consulted by the ER physician.  PAST MEDICAL HISTORY :  She  has a past medical history of Abdominal pain; Anemia; Anxiety; Anxiety and depression; Depression; Hyperlipidemia; Hypertension; Hypokalemia; Hypothyroid; Prediabetes; and S/P gastric bypass.  PAST SURGICAL HISTORY: She  has a past surgical history that includes Gastric bypass; Cholecystectomy; Tubal ligation; and Cervical fusion.  Allergies  Allergen Reactions  . Shellfish Allergy Anaphylaxis  . Theophylline Anaphylaxis    No current facility-administered medications on file prior to encounter.    Current Outpatient Prescriptions on File Prior to Encounter  Medication Sig  . albuterol  (PROAIR HFA) 108 (90 Base) MCG/ACT inhaler INHALE TWO PUFFS BY MOUTH 4 TIMES DAILY TO EVERY 4 HOURS  . ALPRAZolam (XANAX) 1 MG tablet Take 1 mg by mouth 3 (three) times daily as needed for anxiety.   Marland Kitchen aspirin 81 MG EC tablet Take 81 mg by mouth daily.    . calcium carbonate (OS-CAL) 600 MG TABS Take 600 mg by mouth daily.    . Cholecalciferol (VITAMIN D3) 5000 units CAPS Take 10,000 Units by mouth daily.  Marland Kitchen CINNAMON PO Take 1,000 mg by mouth 3 (three) times daily.  . Cyanocobalamin (VITAMIN B-12 PO) Take 1 tablet by mouth daily.  Marland Kitchen EPINEPHrine (EPI-PEN) 0.3 mg/0.3 mL SOAJ injection Inject 0.3 mLs (0.3 mg total) into the muscle once.  . ferrous sulfate 325 (65 FE) MG tablet Take 325 mg by mouth daily with breakfast.   . furosemide (LASIX) 40 MG tablet TAKE 1 TABLET BY MOUTH TWICE A DAY  . gabapentin (NEURONTIN) 300 MG capsule Take 2 capsules (600 mg total) by mouth 3 (three) times daily.  Marland Kitchen ipratropium-albuterol (DUONEB) 0.5-2.5 (3) MG/3ML SOLN USE 1 VIAL IN NEBULIZER 4 TIMES A DAY (Patient taking differently: USE 1 VIAL IN NEBULIZER 4 TIMES A DAY as needed for shortness of breath)  . levothyroxine (SYNTHROID, LEVOTHROID) 50 MCG tablet TAKE 1 TABLET BY MOUTH ONCE DAILY  . lidocaine (LMX) 4 % cream Apply 1 application topically as needed.  Marland Kitchen MAGNESIUM PO Take 1 tablet by mouth 2 (two) times daily.  . meloxicam (MOBIC) 15 MG tablet Take 1 tablet (15 mg total) by mouth daily.  Marland Kitchen rOPINIRole (REQUIP) 0.5 MG tablet Take 1 tablet (  0.5 mg total) by mouth 3 (three) times daily.  . Simethicone (GAS-X PO) Take 2 tablets by mouth daily as needed (gas). Reported on 09/04/2015  . traZODone (DESYREL) 100 MG tablet TAKE 1/2 TO 2 TABLETS AT BEDTIME  . triamcinolone ointment (KENALOG) 0.5 % Apply to rash 2 to 4 x day as needed  . venlafaxine (EFFEXOR) 100 MG tablet Take 100 mg by mouth 4 (four) times daily - after meals and at bedtime.     FAMILY HISTORY:  Her indicated that her mother is deceased. She indicated  that her father is deceased.    SOCIAL HISTORY: She  reports that she has never smoked. She has never used smokeless tobacco. She reports that she does not drink alcohol or use drugs.  REVIEW OF SYSTEMS:   Cannot obtain due to mild confusion, fatigue  SUBJECTIVE:  As above  VITAL SIGNS: BP 102/58   Pulse 80   Temp 97.3 F (36.3 C) (Oral)   Resp 16   Ht 5\' 5"  (1.651 m)   Wt 185 lb (83.9 kg)   SpO2 100%   BMI 30.79 kg/m   HEMODYNAMICS:    VENTILATOR SETTINGS:    INTAKE / OUTPUT: No intake/output data recorded.  PHYSICAL EXAMINATION: General: Acutely ill-appearing, in bed Neuro:  Sleepy but will arouse to voice, answers some questions, moves all 4 extremities HEENT:  Mucous membranes with notable pallor, extraocular movements intact normocephalic atraumatic Cardiovascular:  Regular rate and rhythm on my exam, no murmurs gallops or rubs Lungs:  Clear to auscultation bilaterally with normal effort Abdomen:  Belly soft, no tenderness to palpation, bowel sounds positive Musculoskeletal:  Normal bulk and tone Skin:  Pale, no rash or skin breakdown  LABS:  BMET  Recent Labs Lab 12/06/15 1034 12/07/2015 0820 12/07/2015 0830  NA 137 137 136  K 5.2 4.4 4.3  CL 95* 100* 101  CO2 28 24  --   BUN 23 24* 26*  CREATININE 2.42* 2.14* 2.00*  GLUCOSE 107* 249* 243*    Electrolytes  Recent Labs Lab 12/06/15 1034 11/29/2015 0820  CALCIUM 10.1 9.2    CBC  Recent Labs Lab 12/06/15 1034 12/03/2015 0820 12/14/2015 0830  WBC 11.8* 17.3*  --   HGB 12.6 10.5* 11.2*  HCT 39.5 34.9* 33.0*  PLT 411* 577*  --     Coag's  Recent Labs Lab 12/07/2015 0820  APTT 28  INR 1.15    Sepsis Markers  Recent Labs Lab 12/19/2015 0830  LATICACIDVEN 5.91*    ABG No results for input(s): PHART, PCO2ART, PO2ART in the last 168 hours.  Liver Enzymes  Recent Labs Lab 12/26/2015 0820  AST 18  ALT 7*  ALKPHOS 92  BILITOT 0.3  ALBUMIN 2.6*    Cardiac Enzymes No results  for input(s): TROPONINI, PROBNP in the last 168 hours.  Glucose No results for input(s): GLUCAP in the last 168 hours.  Imaging No results found.   STUDIES:    CULTURES:   ANTIBIOTICS:   SIGNIFICANT EVENTS:   LINES/TUBES:   DISCUSSION: 65 year old female with a past medical history significant for gastric bypass surgery and chronic abdominal pain taking meloxicam for the same presented to the emergency department on 12/04/2015 with an upper GI bleed. Differential diagnosis includes peptic ulcer disease, gastritis related to medications, Mallory-Weiss tear or other. She needs to go to the ICU because of hypotension but fortunately her blood pressure has responded to crystalloid infusion as well as a blood transfusion. GI medicine to see  today. If bleeding continues she may need to have endoscopy today.  ASSESSMENT / PLAN:  PULMONARY A: History of asthma, not an exacerbation P:   Monitor O2 saturations As needed albuterol and ipratropium per home regimen  CARDIOVASCULAR A:  History of hypertension Hypotension due to hypovolemia and hemorrhage, improved with crystalloid infusion Lactic acidosis due to hypotension and bleeding P:  Continue blood transfusion as ordered Gentle IV fluids Repeat lactic acid Telemetry monitoring  RENAL A:   Chronic kidney disease, uncertain baseline P:   Monitor BMET and UOP Replace electrolytes as needed   GASTROINTESTINAL A:   Upper GI bleed, acute Chronic abdominal pain History of gastric bypass surgery P:   Stat GI consult Consider reattempt NG tube (failed several attempts in the ER) Infusion of pantoprazole  HEMATOLOGIC A:   Hemorrhagic anemia P:  Receiving blood transfusion in the ER now CBC every 6 hours, next at noon today Transfuse if hemoglobin less than 8 while actively bleeding  INFECTIOUS A:   No acute issues P:   Monitor for infection  ENDOCRINE A:   History of hypothyroidism   P:   Hold  Synthroid today, restart when taking by mouth  NEUROLOGIC A:   History of anxiety and chronic pain Mild confusion P:   Hold home sedating medicines for now Monitor closely in ICU   FAMILY  - Updates: Fianc updated at bedside by me on 12/20/2015  - Inter-disciplinary family meet or Palliative Care meeting due by:  day 7  My cc time 40 minutes  Roselie Awkward, MD [facility] Pager: [phone] Cell: [phone] After 3pm or if no response, call [phone]   12/23/2015, 9:23 AM

## 2015-12-09 ENCOUNTER — Inpatient Hospital Stay (HOSPITAL_COMMUNITY): Payer: Medicare Other

## 2015-12-09 ENCOUNTER — Encounter (HOSPITAL_COMMUNITY): Payer: Self-pay | Admitting: Gastroenterology

## 2015-12-09 DIAGNOSIS — Z7189 Other specified counseling: Secondary | ICD-10-CM

## 2015-12-09 DIAGNOSIS — R578 Other shock: Secondary | ICD-10-CM

## 2015-12-09 DIAGNOSIS — I959 Hypotension, unspecified: Secondary | ICD-10-CM

## 2015-12-09 DIAGNOSIS — J9601 Acute respiratory failure with hypoxia: Secondary | ICD-10-CM

## 2015-12-09 DIAGNOSIS — E872 Acidosis: Secondary | ICD-10-CM

## 2015-12-09 LAB — CBC
HCT: 23.3 % — ABNORMAL LOW (ref 36.0–46.0)
HCT: 32.2 % — ABNORMAL LOW (ref 36.0–46.0)
HCT: 24.3 % — ABNORMAL LOW (ref 36.0–46.0)
HCT: 25 % — ABNORMAL LOW (ref 36.0–46.0)
HEMATOCRIT: 31.2 % — AB (ref 36.0–46.0)
HEMOGLOBIN: 10.1 g/dL — AB (ref 12.0–15.0)
Hemoglobin: 7.2 g/dL — ABNORMAL LOW (ref 12.0–15.0)
Hemoglobin: 9.8 g/dL — ABNORMAL LOW (ref 12.0–15.0)
Hemoglobin: 7.7 g/dL — ABNORMAL LOW (ref 12.0–15.0)
Hemoglobin: 7.6 g/dL — ABNORMAL LOW (ref 12.0–15.0)
MCH: 28.5 pg (ref 26.0–34.0)
MCH: 28.2 pg (ref 26.0–34.0)
MCH: 27.5 pg (ref 26.0–34.0)
MCH: 28.5 pg (ref 26.0–34.0)
MCH: 28.6 pg (ref 26.0–34.0)
MCHC: 31.4 g/dL (ref 30.0–36.0)
MCHC: 30.9 g/dL (ref 30.0–36.0)
MCHC: 31.4 g/dL (ref 30.0–36.0)
MCHC: 31.7 g/dL (ref 30.0–36.0)
MCHC: 30.4 g/dL (ref 30.0–36.0)
MCV: 89.7 fL (ref 78.0–100.0)
MCV: 92.1 fL (ref 78.0–100.0)
MCV: 90.6 fL (ref 78.0–100.0)
MCV: 90.7 fL (ref 78.0–100.0)
MCV: 90.3 fL (ref 78.0–100.0)
PLATELETS: 303 10*3/uL (ref 150–400)
PLATELETS: 55 10*3/uL — AB (ref 150–400)
PLATELETS: 111 10*3/uL — AB (ref 150–400)
Platelets: 295 10*3/uL (ref 150–400)
Platelets: 143 10*3/uL — ABNORMAL LOW (ref 150–400)
RBC: 3.48 MIL/uL — AB (ref 3.87–5.11)
RBC: 2.53 MIL/uL — ABNORMAL LOW (ref 3.87–5.11)
RBC: 3.55 MIL/uL — ABNORMAL LOW (ref 3.87–5.11)
RBC: 2.69 MIL/uL — AB (ref 3.87–5.11)
RBC: 2.76 MIL/uL — ABNORMAL LOW (ref 3.87–5.11)
RDW: 14.9 % (ref 11.5–15.5)
RDW: 15.8 % — AB (ref 11.5–15.5)
RDW: 15 % (ref 11.5–15.5)
RDW: 15.7 % — AB (ref 11.5–15.5)
RDW: 15.3 % (ref 11.5–15.5)
WBC: 35.6 10*3/uL — AB (ref 4.0–10.5)
WBC: 25.3 10*3/uL — AB (ref 4.0–10.5)
WBC: 22.8 10*3/uL — AB (ref 4.0–10.5)
WBC: 43.8 10*3/uL — ABNORMAL HIGH (ref 4.0–10.5)
WBC: 37.7 10*3/uL — ABNORMAL HIGH (ref 4.0–10.5)

## 2015-12-09 LAB — CBC WITH DIFFERENTIAL/PLATELET
BASOS PCT: 0 %
Basophils Absolute: 0 10*3/uL (ref 0.0–0.1)
EOS PCT: 0 %
Eosinophils Absolute: 0 10*3/uL (ref 0.0–0.7)
HCT: 27.8 % — ABNORMAL LOW (ref 36.0–46.0)
Hemoglobin: 9 g/dL — ABNORMAL LOW (ref 12.0–15.0)
LYMPHS PCT: 11 %
Lymphs Abs: 1.9 10*3/uL (ref 0.7–4.0)
MCH: 28.1 pg (ref 26.0–34.0)
MCHC: 32.4 g/dL (ref 30.0–36.0)
MCV: 86.9 fL (ref 78.0–100.0)
Monocytes Absolute: 0.9 10*3/uL (ref 0.1–1.0)
Monocytes Relative: 5 %
NEUTROS PCT: 84 %
Neutro Abs: 14.7 10*3/uL — ABNORMAL HIGH (ref 1.7–7.7)
PLATELETS: 443 10*3/uL — AB (ref 150–400)
RBC: 3.2 MIL/uL — ABNORMAL LOW (ref 3.87–5.11)
RDW: 14.6 % (ref 11.5–15.5)
WBC: 17.5 10*3/uL — ABNORMAL HIGH (ref 4.0–10.5)

## 2015-12-09 LAB — GLUCOSE, CAPILLARY
GLUCOSE-CAPILLARY: 222 mg/dL — AB (ref 65–99)
GLUCOSE-CAPILLARY: 233 mg/dL — AB (ref 65–99)
GLUCOSE-CAPILLARY: 423 mg/dL — AB (ref 65–99)
GLUCOSE-CAPILLARY: 567 mg/dL — AB (ref 65–99)
GLUCOSE-CAPILLARY: 582 mg/dL — AB (ref 65–99)
Glucose-Capillary: 420 mg/dL — ABNORMAL HIGH (ref 65–99)
Glucose-Capillary: 218 mg/dL — ABNORMAL HIGH (ref 65–99)

## 2015-12-09 LAB — BASIC METABOLIC PANEL
Anion gap: 12 (ref 5–15)
BUN: 37 mg/dL — AB (ref 6–20)
CALCIUM: 6.9 mg/dL — AB (ref 8.9–10.3)
CO2: 13 mmol/L — AB (ref 22–32)
Chloride: 114 mmol/L — ABNORMAL HIGH (ref 101–111)
Creatinine, Ser: 2.21 mg/dL — ABNORMAL HIGH (ref 0.44–1.00)
GFR calc Af Amer: 26 mL/min — ABNORMAL LOW (ref >60)
GFR, EST NON AFRICAN AMERICAN: 22 mL/min — AB (ref >60)
GLUCOSE: 222 mg/dL — AB (ref 65–99)
Potassium: 6.1 mmol/L — ABNORMAL HIGH (ref 3.5–5.1)
Sodium: 139 mmol/L (ref 135–145)

## 2015-12-09 LAB — MAGNESIUM: MAGNESIUM: 2 mg/dL (ref 1.7–2.4)

## 2015-12-09 LAB — POCT I-STAT 3, ART BLOOD GAS (G3+)
ACID-BASE DEFICIT: 13 mmol/L — AB (ref 0.0–2.0)
ACID-BASE DEFICIT: 24 mmol/L — AB (ref 0.0–2.0)
Acid-base deficit: 14 mmol/L — ABNORMAL HIGH (ref 0.0–2.0)
BICARBONATE: 12.6 mmol/L — AB (ref 20.0–28.0)
Bicarbonate: 15.1 mmol/L — ABNORMAL LOW (ref 20.0–28.0)
Bicarbonate: 5.8 mmol/L — ABNORMAL LOW (ref 20.0–28.0)
O2 SAT: 96 %
O2 SAT: 99 %
O2 SAT: 99 %
PH ART: 6.993 — AB (ref 7.350–7.450)
Patient temperature: 98.2
TCO2: 16 mmol/L (ref 0–100)
TCO2: 13 mmol/L (ref 0–100)
TCO2: 6 mmol/L (ref 0–100)
pCO2 arterial: 42.8 mmHg (ref 32.0–48.0)
pCO2 arterial: 30.1 mmHg — ABNORMAL LOW (ref 32.0–48.0)
pCO2 arterial: 23.7 mmHg — ABNORMAL LOW (ref 32.0–48.0)
pH, Arterial: 7.151 — CL (ref 7.350–7.450)
pH, Arterial: 7.227 — ABNORMAL LOW (ref 7.350–7.450)
pO2, Arterial: 99 mmHg (ref 83.0–108.0)
pO2, Arterial: 161 mmHg — ABNORMAL HIGH (ref 83.0–108.0)
pO2, Arterial: 175 mmHg — ABNORMAL HIGH (ref 83.0–108.0)

## 2015-12-09 LAB — PREPARE RBC (CROSSMATCH)

## 2015-12-09 LAB — PHOSPHORUS: PHOSPHORUS: 6.1 mg/dL — AB (ref 2.5–4.6)

## 2015-12-09 LAB — PROTIME-INR
INR: 3
Prothrombin Time: 31.8 seconds — ABNORMAL HIGH (ref 11.4–15.2)

## 2015-12-09 LAB — LACTIC ACID, PLASMA
LACTIC ACID, VENOUS: 8.7 mmol/L — AB (ref 0.5–1.9)
Lactic Acid, Venous: 7.7 mmol/L (ref 0.5–1.9)

## 2015-12-09 LAB — APTT: APTT: 60 s — AB (ref 24–36)

## 2015-12-09 LAB — TROPONIN I
Troponin I: 0.1 ng/mL (ref <0.03)
Troponin I: 0.27 ng/mL (ref <0.03)

## 2015-12-09 LAB — BLOOD PRODUCT ORDER (VERBAL) VERIFICATION

## 2015-12-09 LAB — PATHOLOGIST SMEAR REVIEW

## 2015-12-09 LAB — CORTISOL: Cortisol, Plasma: 48.6 ug/dL

## 2015-12-09 MED ORDER — SODIUM CHLORIDE 0.9 % IV BOLUS (SEPSIS)
1000.0000 mL | Freq: Once | INTRAVENOUS | Status: AC
  Administered 2015-12-09: 1000 mL via INTRAVENOUS

## 2015-12-09 MED ORDER — INSULIN GLARGINE 100 UNIT/ML ~~LOC~~ SOLN
10.0000 [IU] | Freq: Every day | SUBCUTANEOUS | Status: DC
  Administered 2015-12-09: 10 [IU] via SUBCUTANEOUS
  Filled 2015-12-09: qty 0.1

## 2015-12-09 MED ORDER — SODIUM CHLORIDE 0.9 % IV SOLN
Freq: Once | INTRAVENOUS | Status: AC
  Administered 2015-12-09: 21:00:00 via INTRAVENOUS

## 2015-12-09 MED ORDER — MORPHINE BOLUS VIA INFUSION
5.0000 mg | INTRAVENOUS | Status: DC | PRN
  Filled 2015-12-09: qty 20

## 2015-12-09 MED ORDER — PHENYLEPHRINE HCL 10 MG/ML IJ SOLN
30.0000 ug/min | INTRAVENOUS | Status: DC
  Administered 2015-12-09: 30 ug/min via INTRAVENOUS
  Administered 2015-12-09: 50 ug/min via INTRAVENOUS
  Filled 2015-12-09 (×3): qty 1

## 2015-12-09 MED ORDER — SODIUM CHLORIDE 0.9 % IV SOLN: Freq: Once | INTRAVENOUS | Status: DC

## 2015-12-09 MED ORDER — ORAL CARE MOUTH RINSE
15.0000 mL | OROMUCOSAL | Status: DC
  Administered 2015-12-09 (×6): 15 mL via OROMUCOSAL

## 2015-12-09 MED ORDER — MORPHINE SULFATE 25 MG/ML IV SOLN
10.0000 mg/h | INTRAVENOUS | Status: DC
  Filled 2015-12-09: qty 10

## 2015-12-09 MED ORDER — SODIUM BICARBONATE 8.4 % IV SOLN
INTRAVENOUS | Status: DC
  Administered 2015-12-09 (×2): via INTRAVENOUS
  Filled 2015-12-09 (×7): qty 150

## 2015-12-09 MED ORDER — INSULIN ASPART 100 UNIT/ML ~~LOC~~ SOLN
2.0000 [IU] | SUBCUTANEOUS | Status: DC
  Administered 2015-12-09 (×3): 6 [IU] via SUBCUTANEOUS

## 2015-12-09 MED ORDER — SODIUM CHLORIDE 0.9 % IV SOLN
INTRAVENOUS | Status: DC
  Administered 2015-12-09: 3.6 [IU]/h via INTRAVENOUS
  Filled 2015-12-09: qty 2.5

## 2015-12-09 MED ORDER — SODIUM BICARBONATE 8.4 % IV SOLN
INTRAVENOUS | Status: AC
  Administered 2015-12-09: 02:00:00
  Filled 2015-12-09: qty 100

## 2015-12-09 MED ORDER — PHENYLEPHRINE HCL 10 MG/ML IJ SOLN
30.0000 ug/min | INTRAVENOUS | Status: DC
  Administered 2015-12-09 (×3): 200 ug/min via INTRAVENOUS
  Filled 2015-12-09 (×5): qty 4

## 2015-12-09 MED ORDER — SODIUM CHLORIDE 0.9 % IV BOLUS (SEPSIS): 1000.0000 mL | Freq: Once | INTRAVENOUS | Status: DC

## 2015-12-09 MED ORDER — SODIUM CHLORIDE 0.9 % IV SOLN
1.0000 g | Freq: Once | INTRAVENOUS | Status: AC
  Administered 2015-12-09: 1 g via INTRAVENOUS
  Filled 2015-12-09: qty 10

## 2015-12-09 MED ORDER — NOREPINEPHRINE BITARTRATE 1 MG/ML IV SOLN
0.0000 ug/min | INTRAVENOUS | Status: DC
  Administered 2015-12-09: 75 ug/min via INTRAVENOUS
  Administered 2015-12-09: 40 ug/min via INTRAVENOUS
  Administered 2015-12-09 (×2): 75 ug/min via INTRAVENOUS
  Filled 2015-12-09 (×5): qty 16

## 2015-12-09 MED ORDER — INSULIN ASPART 100 UNIT/ML ~~LOC~~ SOLN
0.0000 [IU] | SUBCUTANEOUS | Status: DC
  Administered 2015-12-09: 7 [IU] via SUBCUTANEOUS

## 2015-12-09 MED ORDER — VASOPRESSIN 20 UNIT/ML IV SOLN
0.0300 [IU]/min | INTRAVENOUS | Status: DC
  Administered 2015-12-09 (×2): 0.03 [IU]/min via INTRAVENOUS
  Filled 2015-12-09 (×2): qty 2

## 2015-12-09 MED ORDER — SODIUM CHLORIDE 0.9 % IV SOLN
Freq: Once | INTRAVENOUS | Status: AC
  Administered 2015-12-09: 16:00:00 via INTRAVENOUS

## 2015-12-10 ENCOUNTER — Ambulatory Visit (HOSPITAL_COMMUNITY): Payer: Self-pay

## 2015-12-10 ENCOUNTER — Encounter: Payer: Self-pay | Admitting: Internal Medicine

## 2015-12-10 LAB — GLUCOSE, CAPILLARY

## 2015-12-10 NOTE — Progress Notes (Signed)
Patient time of death 2352. Breath sounds auscultated by two RNS with no spontaneous breath sounds or heart sounds. MD and CDS notified. Family was at bedside at time of death.

## 2015-12-10 NOTE — Progress Notes (Signed)
125cc fentanyl wasted in sink. Wintessed by Willene Hatchet, RN.

## 2015-12-12 ENCOUNTER — Encounter (HOSPITAL_COMMUNITY): Payer: Self-pay | Admitting: Gastroenterology

## 2015-12-12 ENCOUNTER — Telehealth: Payer: Self-pay

## 2015-12-12 LAB — TYPE AND SCREEN
ABO/RH(D): O POS
Antibody Screen: NEGATIVE
UNIT DIVISION: 0
UNIT DIVISION: 0
UNIT DIVISION: 0
UNIT DIVISION: 0
UNIT DIVISION: 0
Unit division: 0
Unit division: 0
Unit division: 0
Unit division: 0
Unit division: 0
Unit division: 0
Unit division: 0
Unit division: 0
Unit division: 0

## 2015-12-13 NOTE — Telephone Encounter (Signed)
On 12/12/2015 I received a death certificate from [facility] (original) The death certificate will be taken to [facility]) for signature. On 01/02/16 I received the death certificate back from Doctor Nelda Marseille. I got the death certificate ready and called the funeral home to let them know the death certificate is ready for pickup.

## 2015-12-13 NOTE — Discharge Summary (Signed)
NAMEMERIL, [name]:  [id number]  MEDICAL RECORD NO.:  [id number]  LOCATION:  2H01C                        FACILITY:  [place]  PHYSICIAN:  Providence Lanius, MD  DATE OF BIRTH:  29-Jul-1950  DATE OF ADMISSION:  12/07/2015 DATE OF DISCHARGE:  12/10/2015                              DISCHARGE SUMMARY   DEATH SUMMARY  PRIMARY DIAGNOSIS/CAUSE OF DEATH:  Upper GI bleed.  SECONDARY DIAGNOSES:  Acute respiratory failure, hematemesis, acute encephalopathy, altered mental status, hemorrhagic shock, lactic acidosis, acute on chronic renal failure, chronic abdominal pain, history of gastric bypass surgery, hemorrhagic anemia, hypothyroidism, and anxiety.  HOSPITAL COURSE:  The patient is a 65 year old female with past medical history significant for gastric bypass in the past, who was left with multiple complications, mainly multiple episodes of her bowel obstruction.  The patient presented to the hospital with hematemesis. EGD was performed that noted some ulceration in the duodenum.  IR and Surgery were involved, however, the patient decompensated overnight and developed refractory hemorrhagic shock in spite of multiple transfusion. We had a meeting with the patient's cousin who is her next of kin and the patient had severely poor prognosis and at that point, was made DNR. The patient's vital signs continued to deteriorate that night and expired shortly thereafter.     Providence Lanius, MD     WJY/MEDQ  D:  12/12/2015  T:    Job:  [id number]

## 2015-12-29 NOTE — Progress Notes (Signed)
PULMONARY / CRITICAL CARE MEDICINE  11/30/2015 Name: Lauren Mckenzie MRN: [id number] DOB: [birth year]    ADMISSION DATE:  12/10/2015 CONSULTATION DATE:  12/21/2015  REFERRING MD:  Dr. Maryan Rued, ER physician  CHIEF COMPLAINT:  Vomiting blood  HISTORY OF PRESENT ILLNESS:   65 year old female with a past medical history significant for hypertension, chronic kidney disease, anxiety, gastric bypass came to the emergency room on 12/07/2015 with the sudden onset of vomiting blood. Her fianc provides most of the history because she is quite fatigued and somewhat confused. He states that she had the sudden onset of bright red blood this morning and so they came to the emergency room. She had several episodes of vomiting blood. She has had chronic abdominal pain and has undergone endoscopy in the past. She was recently treated with meloxicam to see if this would help with her abdominal pain. She was to see a specialist at [facility] for further evaluation. Her fianc notes that her belly pain was no worse in the last few days and it is at baseline. She has not passed blood per rectum. In the emergency department she was noted to be hypotensive and somewhat confused. Her blood pressure has improved with saline and blood. GI medicine was consulted by the ER physician. She initially  Underwent endoscopy, which demonstrated possible MW tear, large gastric ulcer in the cardia and possible duodenal ulcer and IR was consulted. She was taken to IR and underwent embolization of the GDA. She had continued bleeding and required intubation for airway protection. She underwent additional endoscopy. Cardia ulcer was injected with lidocaine, however, no obvious oozing. Despite all these interventions she continued to have BRBPR and shock requiring pressors. CCS felt that if bleeding could not be abated endoscopically then she would need surgery which would likely prove fatal in this setting.   SUBJECTIVE:     VITAL SIGNS: BP (!) 85/61 Comment: called elink. bolus done infusing.   Pulse 96   Temp 98 F (36.7 C) (Oral)   Resp (!) 22   Ht 5\' 5"  (1.651 m)   Wt 101.3 kg (223 lb 5.2 oz)   SpO2 100%   BMI 37.16 kg/m   HEMODYNAMICS:    VENTILATOR SETTINGS: Vent Mode: PRVC FiO2 (%):  [1 %-50 %] 40 % Set Rate:  [16 bmp-24 bmp] 24 bmp Vt Set:  [460 mL] 460 mL PEEP:  [5 cmH20] 5 cmH20 Plateau Pressure:  [13 cmH20-16 cmH20] 13 cmH20  INTAKE / OUTPUT: I/O last 3 completed shifts: In: 3765.5 [P.O.:240; I.V.:2185.5; Blood:1005; Other:335] Out: 850 [Urine:600; Emesis/NG output:250]  PHYSICAL EXAMINATION: General: intubated pale female Neuro:  sedate HEENT:  Mucous membranes with notable pallor, extraocular movements intact normocephalic atraumatic Cardiovascular:  Tachy and regular rhythm on my exam, no murmurs gallops or rubs Lungs:  Clear to auscultation bilaterally with normal effort Abdomen:  Belly soft, no tenderness to palpation, bowel sounds positive Musculoskeletal:  Normal bulk and tone Skin:  Pale, no rash or skin breakdown  LABS:  BMET  Recent Labs Lab 12/06/2015 1809 12/03/2015 1932 12/18/2015 2100 01-02-2016 0500  NA 138 139 134* 139  K 5.1 4.9 4.9 6.1*  CL 106 105 110 114*  CO2 25  --  18* 13*  BUN 28* 30* 27* 37*  CREATININE 1.70* 1.70* 1.55* 2.21*  GLUCOSE 102* 164* 323* 222*    Electrolytes  Recent Labs Lab 12/01/2015 1809 12/11/2015 2100 2016/01/02 0500  CALCIUM 8.4* 6.3* 6.9*    CBC  Recent Labs  Lab 11/29/2015 2100 01-07-2016 0000 2016/01/07 0500  WBC 17.5* 25.3* PENDING  HGB 9.0* 7.2* 9.8*  HCT 27.8* 23.3* 31.2*  PLT 443* 303 PENDING    Coag's  Recent Labs Lab 11/30/2015 0820 12/14/2015 2100  APTT 28 29  INR 1.15 1.45    Sepsis Markers  Recent Labs Lab 12/16/2015 2102 07-Jan-2016 0002 07-Jan-2016 0500  LATICACIDVEN 2.3* 8.7* 7.7*    ABG  Recent Labs Lab 12/12/2015 1904 01-07-2016 0146 Jan 07, 2016 0231  PHART 7.330* 7.151* 7.227*  PCO2ART 42.9  42.8 30.1*  PO2ART 510.0* 99.0 161.0*    Liver Enzymes  Recent Labs Lab 12/11/2015 0820 12/22/2015 2100  AST 18 14*  ALT 7* 7*  ALKPHOS 92 50  BILITOT 0.3 0.4  ALBUMIN 2.6* 1.7*    Cardiac Enzymes  Recent Labs Lab 12/25/2015 1809 January 07, 2016 0042  TROPONINI 0.04* 0.10*    Glucose  Recent Labs Lab 2016-01-07 0146 Jan 07, 2016 0506  GLUCAP 420* 218*    Imaging Ir Angiogram Visceral Selective  Result Date: 12/07/2015 INDICATION: 65 year old female with admission for acute hematemesis and upper gastrointestinal hemorrhage. Upper endoscopy has demonstrated acute hemorrhage within the duodenum, as a target for angiogram/embolization. EXAM: IR EMBO ART VEN HEMORR LYMPH EXTRAV INC GUIDE ROADMAPPING; IR ULTRASOUND GUIDANCE VASC ACCESS RIGHT; ARTERIOGRAPHY; IR RIGHT FLOURO GUIDE CV LINE; SELECTIVE VISCERAL ARTERIOGRAPHY; ADDITIONAL ARTERIOGRAPHY ULTRASOUND-GUIDED CENTRAL VENOUS CATHETER PLACEMENT. MEDICATIONS: None ANESTHESIA/SEDATION: Moderate (conscious) sedation was employed during this procedure. A total of Versed 1.5 mg and Fentanyl 50 mcg was administered intravenously. Moderate Sedation Time: 57 minutes. The patient's level of consciousness and vital signs were monitored continuously by radiology nursing throughout the procedure under my direct supervision. CONTRAST:  55 cc Isovue FLUOROSCOPY TIME:  Fluoroscopy Time: 17 minutes 18 seconds (1613 mGy). COMPLICATIONS: None PROCEDURE: Informed consent was obtained from the patient following explanation of the procedure, risks, benefits and alternatives. The patient understands, agrees and consents for the procedure. All questions were addressed. A time out was performed prior to the initiation of the procedure. Maximal barrier sterile technique utilized including caps, mask, sterile gowns, sterile gloves, large sterile drape, hand hygiene, and Betadine prep. Ultrasound survey of the right inguinal region was stored of the venous anatomy. After  generous local anesthesia 0 was injected using 1% lidocaine, a small incision was made in the skin. A single wall needle was observed under ultrasound to enter the right common femoral vein. With venous blood flow returned, a standard 035 wire was advanced under fluoroscopy into the IVC. The needle was removed. Soft tissue dilation was performed. Triple-lumen catheter was then placed over the wire and a wire was removed. The 3 ports were flushed and sterile caps placed. Catheter was sutured in position. Ultrasound survey of the right common femoral artery was performed with images stored and sent to PACs. A single wall needle was used access the right common femoral artery under ultrasound. With excellent arterial blood flow returned, a standard 035 wire was passed through the needle, observed enter the abdominal aorta under fluoroscopy. The needle was removed. A standard 5 French vascular sheath was placed. The dilator was removed and the sheath was flushed. Bentson wire was advanced into the abdominal aorta. A standard 5 French C2 Cobra catheter was advanced over the wire and used to select the celiac artery. Celiac angiogram was performed. Obliquities were required with limited angiogram given the presence of retained enteric contrast within the colon. Micro catheter system with 0 1 for micro wire and O2 1 lumen micro catheter  were then advanced through the base catheter into the common hepatic artery. The gastroduodenal artery was selected, with dedicated angiogram performed. Given the patient's upper endoscopy findings, empiric embolization was then performed of the gastroduodenal artery through the micro catheter. Status post embolization repeat angiogram was performed with no persisting flow through the gastroduodenal artery. Angiogram of the common femoral artery access site was performed. Exoseal was deployed for hemostasis. Patient tolerated the procedure well and remained hemodynamically stable  throughout. No complications were encountered and no significant blood loss encountered. FINDINGS: Ultrasound survey of the right inguinal region demonstrates patency of the right common femoral vein and the right common femoral artery. Initial images demonstrate significant retained enteric contrast through the colon, limiting the fidelity of the angiogram. Limited mesenteric angiogram of celiac artery demonstrates typical anatomy with patent splenic artery, common hepatic artery, gastroduodenal artery, and small left gastric artery. Dedicated selective and sub selective angiogram of the second and third order branches of the celiac artery demonstrate no tumor blush, extravasation of contrast, pooling of contrast, pseudoaneurysm, or arterial venous shunting. Status post coil embolization of gastroduodenal artery, there is no residual flow. IMPRESSION: Status post ultrasound-guided right common femoral vein central venous catheter. Catheter ready for use. Status post mesenteric angiogram with empiric embolization of gastroduodenal artery for life-threatening acute hemorrhage observed on endoscopy at the duodenum. Status post deployment of right common femoral artery Exoseal. Signed, Dulcy Fanny. Earleen Newport, DO [facility] Radiology Electronically Signed   By: Corrie Mckusick D.O.   On: 12/07/2015 15:38   Ir Angiogram Selective Each Additional Vessel  Result Date: 12/12/2015 INDICATION: 65 year old female with admission for acute hematemesis and upper gastrointestinal hemorrhage. Upper endoscopy has demonstrated acute hemorrhage within the duodenum, as a target for angiogram/embolization. EXAM: IR EMBO ART VEN HEMORR LYMPH EXTRAV INC GUIDE ROADMAPPING; IR ULTRASOUND GUIDANCE VASC ACCESS RIGHT; ARTERIOGRAPHY; IR RIGHT FLOURO GUIDE CV LINE; SELECTIVE VISCERAL ARTERIOGRAPHY; ADDITIONAL ARTERIOGRAPHY ULTRASOUND-GUIDED CENTRAL VENOUS CATHETER PLACEMENT. MEDICATIONS: None  ANESTHESIA/SEDATION: Moderate (conscious) sedation was employed during this procedure. A total of Versed 1.5 mg and Fentanyl 50 mcg was administered intravenously. Moderate Sedation Time: 57 minutes. The patient's level of consciousness and vital signs were monitored continuously by radiology nursing throughout the procedure under my direct supervision. CONTRAST:  55 cc Isovue FLUOROSCOPY TIME:  Fluoroscopy Time: 17 minutes 18 seconds (1613 mGy). COMPLICATIONS: None PROCEDURE: Informed consent was obtained from the patient following explanation of the procedure, risks, benefits and alternatives. The patient understands, agrees and consents for the procedure. All questions were addressed. A time out was performed prior to the initiation of the procedure. Maximal barrier sterile technique utilized including caps, mask, sterile gowns, sterile gloves, large sterile drape, hand hygiene, and Betadine prep. Ultrasound survey of the right inguinal region was stored of the venous anatomy. After generous local anesthesia 0 was injected using 1% lidocaine, a small incision was made in the skin. A single wall needle was observed under ultrasound to enter the right common femoral vein. With venous blood flow returned, a standard 035 wire was advanced under fluoroscopy into the IVC. The needle was removed. Soft tissue dilation was performed. Triple-lumen catheter was then placed over the wire and a wire was removed. The 3 ports were flushed and sterile caps placed. Catheter was sutured in position. Ultrasound survey of the right common femoral artery was performed with images stored and sent to PACs. A single wall needle was used access the right common femoral artery under ultrasound. With excellent  arterial blood flow returned, a standard 035 wire was passed through the needle, observed enter the abdominal aorta under fluoroscopy. The needle was removed. A standard 5 French vascular sheath was placed. The dilator was removed  and the sheath was flushed. Bentson wire was advanced into the abdominal aorta. A standard 5 French C2 Cobra catheter was advanced over the wire and used to select the celiac artery. Celiac angiogram was performed. Obliquities were required with limited angiogram given the presence of retained enteric contrast within the colon. Micro catheter system with 0 1 for micro wire and O2 1 lumen micro catheter were then advanced through the base catheter into the common hepatic artery. The gastroduodenal artery was selected, with dedicated angiogram performed. Given the patient's upper endoscopy findings, empiric embolization was then performed of the gastroduodenal artery through the micro catheter. Status post embolization repeat angiogram was performed with no persisting flow through the gastroduodenal artery. Angiogram of the common femoral artery access site was performed. Exoseal was deployed for hemostasis. Patient tolerated the procedure well and remained hemodynamically stable throughout. No complications were encountered and no significant blood loss encountered. FINDINGS: Ultrasound survey of the right inguinal region demonstrates patency of the right common femoral vein and the right common femoral artery. Initial images demonstrate significant retained enteric contrast through the colon, limiting the fidelity of the angiogram. Limited mesenteric angiogram of celiac artery demonstrates typical anatomy with patent splenic artery, common hepatic artery, gastroduodenal artery, and small left gastric artery. Dedicated selective and sub selective angiogram of the second and third order branches of the celiac artery demonstrate no tumor blush, extravasation of contrast, pooling of contrast, pseudoaneurysm, or arterial venous shunting. Status post coil embolization of gastroduodenal artery, there is no residual flow. IMPRESSION: Status post ultrasound-guided right common femoral vein central venous catheter. Catheter  ready for use. Status post mesenteric angiogram with empiric embolization of gastroduodenal artery for life-threatening acute hemorrhage observed on endoscopy at the duodenum. Status post deployment of right common femoral artery Exoseal. Signed, Dulcy Fanny. Earleen Newport, DO [facility] Radiology Electronically Signed   By: Corrie Mckusick D.O.   On: 11/29/2015 15:38   Ir Angiogram Follow Up Study  Result Date: 12/01/2015 INDICATION: 65 year old female with admission for acute hematemesis and upper gastrointestinal hemorrhage. Upper endoscopy has demonstrated acute hemorrhage within the duodenum, as a target for angiogram/embolization. EXAM: IR EMBO ART VEN HEMORR LYMPH EXTRAV INC GUIDE ROADMAPPING; IR ULTRASOUND GUIDANCE VASC ACCESS RIGHT; ARTERIOGRAPHY; IR RIGHT FLOURO GUIDE CV LINE; SELECTIVE VISCERAL ARTERIOGRAPHY; ADDITIONAL ARTERIOGRAPHY ULTRASOUND-GUIDED CENTRAL VENOUS CATHETER PLACEMENT. MEDICATIONS: None ANESTHESIA/SEDATION: Moderate (conscious) sedation was employed during this procedure. A total of Versed 1.5 mg and Fentanyl 50 mcg was administered intravenously. Moderate Sedation Time: 57 minutes. The patient's level of consciousness and vital signs were monitored continuously by radiology nursing throughout the procedure under my direct supervision. CONTRAST:  55 cc Isovue FLUOROSCOPY TIME:  Fluoroscopy Time: 17 minutes 18 seconds (1613 mGy). COMPLICATIONS: None PROCEDURE: Informed consent was obtained from the patient following explanation of the procedure, risks, benefits and alternatives. The patient understands, agrees and consents for the procedure. All questions were addressed. A time out was performed prior to the initiation of the procedure. Maximal barrier sterile technique utilized including caps, mask, sterile gowns, sterile gloves, large sterile drape, hand hygiene, and Betadine prep. Ultrasound survey of the right inguinal region was stored of the  venous anatomy. After generous local anesthesia 0 was injected using 1% lidocaine, a small incision was made  in the skin. A single wall needle was observed under ultrasound to enter the right common femoral vein. With venous blood flow returned, a standard 035 wire was advanced under fluoroscopy into the IVC. The needle was removed. Soft tissue dilation was performed. Triple-lumen catheter was then placed over the wire and a wire was removed. The 3 ports were flushed and sterile caps placed. Catheter was sutured in position. Ultrasound survey of the right common femoral artery was performed with images stored and sent to PACs. A single wall needle was used access the right common femoral artery under ultrasound. With excellent arterial blood flow returned, a standard 035 wire was passed through the needle, observed enter the abdominal aorta under fluoroscopy. The needle was removed. A standard 5 French vascular sheath was placed. The dilator was removed and the sheath was flushed. Bentson wire was advanced into the abdominal aorta. A standard 5 French C2 Cobra catheter was advanced over the wire and used to select the celiac artery. Celiac angiogram was performed. Obliquities were required with limited angiogram given the presence of retained enteric contrast within the colon. Micro catheter system with 0 1 for micro wire and O2 1 lumen micro catheter were then advanced through the base catheter into the common hepatic artery. The gastroduodenal artery was selected, with dedicated angiogram performed. Given the patient's upper endoscopy findings, empiric embolization was then performed of the gastroduodenal artery through the micro catheter. Status post embolization repeat angiogram was performed with no persisting flow through the gastroduodenal artery. Angiogram of the common femoral artery access site was performed. Exoseal was deployed for hemostasis. Patient tolerated the procedure well and remained  hemodynamically stable throughout. No complications were encountered and no significant blood loss encountered. FINDINGS: Ultrasound survey of the right inguinal region demonstrates patency of the right common femoral vein and the right common femoral artery. Initial images demonstrate significant retained enteric contrast through the colon, limiting the fidelity of the angiogram. Limited mesenteric angiogram of celiac artery demonstrates typical anatomy with patent splenic artery, common hepatic artery, gastroduodenal artery, and small left gastric artery. Dedicated selective and sub selective angiogram of the second and third order branches of the celiac artery demonstrate no tumor blush, extravasation of contrast, pooling of contrast, pseudoaneurysm, or arterial venous shunting. Status post coil embolization of gastroduodenal artery, there is no residual flow. IMPRESSION: Status post ultrasound-guided right common femoral vein central venous catheter. Catheter ready for use. Status post mesenteric angiogram with empiric embolization of gastroduodenal artery for life-threatening acute hemorrhage observed on endoscopy at the duodenum. Status post deployment of right common femoral artery Exoseal. Signed, Dulcy Fanny. Earleen Newport, DO [facility] Radiology Electronically Signed   By: Corrie Mckusick D.O.   On: 12/02/2015 15:38   Ir Fluoro Guide Cv Line Right  Result Date: 12/14/2015 INDICATION: 66 year old female with admission for acute hematemesis and upper gastrointestinal hemorrhage. Upper endoscopy has demonstrated acute hemorrhage within the duodenum, as a target for angiogram/embolization. EXAM: IR EMBO ART VEN HEMORR LYMPH EXTRAV INC GUIDE ROADMAPPING; IR ULTRASOUND GUIDANCE VASC ACCESS RIGHT; ARTERIOGRAPHY; IR RIGHT FLOURO GUIDE CV LINE; SELECTIVE VISCERAL ARTERIOGRAPHY; ADDITIONAL ARTERIOGRAPHY ULTRASOUND-GUIDED CENTRAL VENOUS CATHETER PLACEMENT. MEDICATIONS: None  ANESTHESIA/SEDATION: Moderate (conscious) sedation was employed during this procedure. A total of Versed 1.5 mg and Fentanyl 50 mcg was administered intravenously. Moderate Sedation Time: 57 minutes. The patient's level of consciousness and vital signs were monitored continuously by radiology nursing throughout the procedure under my direct supervision. CONTRAST:  55 cc Isovue  FLUOROSCOPY TIME:  Fluoroscopy Time: 17 minutes 18 seconds (1613 mGy). COMPLICATIONS: None PROCEDURE: Informed consent was obtained from the patient following explanation of the procedure, risks, benefits and alternatives. The patient understands, agrees and consents for the procedure. All questions were addressed. A time out was performed prior to the initiation of the procedure. Maximal barrier sterile technique utilized including caps, mask, sterile gowns, sterile gloves, large sterile drape, hand hygiene, and Betadine prep. Ultrasound survey of the right inguinal region was stored of the venous anatomy. After generous local anesthesia 0 was injected using 1% lidocaine, a small incision was made in the skin. A single wall needle was observed under ultrasound to enter the right common femoral vein. With venous blood flow returned, a standard 035 wire was advanced under fluoroscopy into the IVC. The needle was removed. Soft tissue dilation was performed. Triple-lumen catheter was then placed over the wire and a wire was removed. The 3 ports were flushed and sterile caps placed. Catheter was sutured in position. Ultrasound survey of the right common femoral artery was performed with images stored and sent to PACs. A single wall needle was used access the right common femoral artery under ultrasound. With excellent arterial blood flow returned, a standard 035 wire was passed through the needle, observed enter the abdominal aorta under fluoroscopy. The needle was removed. A standard 5 French vascular sheath was placed. The dilator was removed  and the sheath was flushed. Bentson wire was advanced into the abdominal aorta. A standard 5 French C2 Cobra catheter was advanced over the wire and used to select the celiac artery. Celiac angiogram was performed. Obliquities were required with limited angiogram given the presence of retained enteric contrast within the colon. Micro catheter system with 0 1 for micro wire and O2 1 lumen micro catheter were then advanced through the base catheter into the common hepatic artery. The gastroduodenal artery was selected, with dedicated angiogram performed. Given the patient's upper endoscopy findings, empiric embolization was then performed of the gastroduodenal artery through the micro catheter. Status post embolization repeat angiogram was performed with no persisting flow through the gastroduodenal artery. Angiogram of the common femoral artery access site was performed. Exoseal was deployed for hemostasis. Patient tolerated the procedure well and remained hemodynamically stable throughout. No complications were encountered and no significant blood loss encountered. FINDINGS: Ultrasound survey of the right inguinal region demonstrates patency of the right common femoral vein and the right common femoral artery. Initial images demonstrate significant retained enteric contrast through the colon, limiting the fidelity of the angiogram. Limited mesenteric angiogram of celiac artery demonstrates typical anatomy with patent splenic artery, common hepatic artery, gastroduodenal artery, and small left gastric artery. Dedicated selective and sub selective angiogram of the second and third order branches of the celiac artery demonstrate no tumor blush, extravasation of contrast, pooling of contrast, pseudoaneurysm, or arterial venous shunting. Status post coil embolization of gastroduodenal artery, there is no residual flow. IMPRESSION: Status post ultrasound-guided right common femoral vein central venous catheter. Catheter  ready for use. Status post mesenteric angiogram with empiric embolization of gastroduodenal artery for life-threatening acute hemorrhage observed on endoscopy at the duodenum. Status post deployment of right common femoral artery Exoseal. Signed, Dulcy Fanny. Earleen Newport, DO [facility] Radiology Electronically Signed   By: Corrie Mckusick D.O.   On: 12/11/2015 15:38   Ir US Guide Vasc Access Right  Result Date: 12/07/2015 INDICATION: 65 year old female with admission for acute hematemesis and upper gastrointestinal hemorrhage.  Upper endoscopy has demonstrated acute hemorrhage within the duodenum, as a target for angiogram/embolization. EXAM: IR EMBO ART VEN HEMORR LYMPH EXTRAV INC GUIDE ROADMAPPING; IR ULTRASOUND GUIDANCE VASC ACCESS RIGHT; ARTERIOGRAPHY; IR RIGHT FLOURO GUIDE CV LINE; SELECTIVE VISCERAL ARTERIOGRAPHY; ADDITIONAL ARTERIOGRAPHY ULTRASOUND-GUIDED CENTRAL VENOUS CATHETER PLACEMENT. MEDICATIONS: None ANESTHESIA/SEDATION: Moderate (conscious) sedation was employed during this procedure. A total of Versed 1.5 mg and Fentanyl 50 mcg was administered intravenously. Moderate Sedation Time: 57 minutes. The patient's level of consciousness and vital signs were monitored continuously by radiology nursing throughout the procedure under my direct supervision. CONTRAST:  55 cc Isovue FLUOROSCOPY TIME:  Fluoroscopy Time: 17 minutes 18 seconds (1613 mGy). COMPLICATIONS: None PROCEDURE: Informed consent was obtained from the patient following explanation of the procedure, risks, benefits and alternatives. The patient understands, agrees and consents for the procedure. All questions were addressed. A time out was performed prior to the initiation of the procedure. Maximal barrier sterile technique utilized including caps, mask, sterile gowns, sterile gloves, large sterile drape, hand hygiene, and Betadine prep. Ultrasound survey of the right inguinal region was stored of the  venous anatomy. After generous local anesthesia 0 was injected using 1% lidocaine, a small incision was made in the skin. A single wall needle was observed under ultrasound to enter the right common femoral vein. With venous blood flow returned, a standard 035 wire was advanced under fluoroscopy into the IVC. The needle was removed. Soft tissue dilation was performed. Triple-lumen catheter was then placed over the wire and a wire was removed. The 3 ports were flushed and sterile caps placed. Catheter was sutured in position. Ultrasound survey of the right common femoral artery was performed with images stored and sent to PACs. A single wall needle was used access the right common femoral artery under ultrasound. With excellent arterial blood flow returned, a standard 035 wire was passed through the needle, observed enter the abdominal aorta under fluoroscopy. The needle was removed. A standard 5 French vascular sheath was placed. The dilator was removed and the sheath was flushed. Bentson wire was advanced into the abdominal aorta. A standard 5 French C2 Cobra catheter was advanced over the wire and used to select the celiac artery. Celiac angiogram was performed. Obliquities were required with limited angiogram given the presence of retained enteric contrast within the colon. Micro catheter system with 0 1 for micro wire and O2 1 lumen micro catheter were then advanced through the base catheter into the common hepatic artery. The gastroduodenal artery was selected, with dedicated angiogram performed. Given the patient's upper endoscopy findings, empiric embolization was then performed of the gastroduodenal artery through the micro catheter. Status post embolization repeat angiogram was performed with no persisting flow through the gastroduodenal artery. Angiogram of the common femoral artery access site was performed. Exoseal was deployed for hemostasis. Patient tolerated the procedure well and remained  hemodynamically stable throughout. No complications were encountered and no significant blood loss encountered. FINDINGS: Ultrasound survey of the right inguinal region demonstrates patency of the right common femoral vein and the right common femoral artery. Initial images demonstrate significant retained enteric contrast through the colon, limiting the fidelity of the angiogram. Limited mesenteric angiogram of celiac artery demonstrates typical anatomy with patent splenic artery, common hepatic artery, gastroduodenal artery, and small left gastric artery. Dedicated selective and sub selective angiogram of the second and third order branches of the celiac artery demonstrate no tumor blush, extravasation of contrast, pooling of contrast, pseudoaneurysm, or arterial venous shunting. Status post coil  embolization of gastroduodenal artery, there is no residual flow. IMPRESSION: Status post ultrasound-guided right common femoral vein central venous catheter. Catheter ready for use. Status post mesenteric angiogram with empiric embolization of gastroduodenal artery for life-threatening acute hemorrhage observed on endoscopy at the duodenum. Status post deployment of right common femoral artery Exoseal. Signed, Dulcy Fanny. Earleen Newport, DO [facility] Radiology Electronically Signed   By: Corrie Mckusick D.O.   On: 12/04/2015 15:38   Ir US Guide Vasc Access Right  Result Date: 12/24/2015 INDICATION: 65 year old female with admission for acute hematemesis and upper gastrointestinal hemorrhage. Upper endoscopy has demonstrated acute hemorrhage within the duodenum, as a target for angiogram/embolization. EXAM: IR EMBO ART VEN HEMORR LYMPH EXTRAV INC GUIDE ROADMAPPING; IR ULTRASOUND GUIDANCE VASC ACCESS RIGHT; ARTERIOGRAPHY; IR RIGHT FLOURO GUIDE CV LINE; SELECTIVE VISCERAL ARTERIOGRAPHY; ADDITIONAL ARTERIOGRAPHY ULTRASOUND-GUIDED CENTRAL VENOUS CATHETER PLACEMENT. MEDICATIONS: None  ANESTHESIA/SEDATION: Moderate (conscious) sedation was employed during this procedure. A total of Versed 1.5 mg and Fentanyl 50 mcg was administered intravenously. Moderate Sedation Time: 57 minutes. The patient's level of consciousness and vital signs were monitored continuously by radiology nursing throughout the procedure under my direct supervision. CONTRAST:  55 cc Isovue FLUOROSCOPY TIME:  Fluoroscopy Time: 17 minutes 18 seconds (1613 mGy). COMPLICATIONS: None PROCEDURE: Informed consent was obtained from the patient following explanation of the procedure, risks, benefits and alternatives. The patient understands, agrees and consents for the procedure. All questions were addressed. A time out was performed prior to the initiation of the procedure. Maximal barrier sterile technique utilized including caps, mask, sterile gowns, sterile gloves, large sterile drape, hand hygiene, and Betadine prep. Ultrasound survey of the right inguinal region was stored of the venous anatomy. After generous local anesthesia 0 was injected using 1% lidocaine, a small incision was made in the skin. A single wall needle was observed under ultrasound to enter the right common femoral vein. With venous blood flow returned, a standard 035 wire was advanced under fluoroscopy into the IVC. The needle was removed. Soft tissue dilation was performed. Triple-lumen catheter was then placed over the wire and a wire was removed. The 3 ports were flushed and sterile caps placed. Catheter was sutured in position. Ultrasound survey of the right common femoral artery was performed with images stored and sent to PACs. A single wall needle was used access the right common femoral artery under ultrasound. With excellent arterial blood flow returned, a standard 035 wire was passed through the needle, observed enter the abdominal aorta under fluoroscopy. The needle was removed. A standard 5 French vascular sheath was placed. The dilator was removed  and the sheath was flushed. Bentson wire was advanced into the abdominal aorta. A standard 5 French C2 Cobra catheter was advanced over the wire and used to select the celiac artery. Celiac angiogram was performed. Obliquities were required with limited angiogram given the presence of retained enteric contrast within the colon. Micro catheter system with 0 1 for micro wire and O2 1 lumen micro catheter were then advanced through the base catheter into the common hepatic artery. The gastroduodenal artery was selected, with dedicated angiogram performed. Given the patient's upper endoscopy findings, empiric embolization was then performed of the gastroduodenal artery through the micro catheter. Status post embolization repeat angiogram was performed with no persisting flow through the gastroduodenal artery. Angiogram of the common femoral artery access site was performed. Exoseal was deployed for hemostasis. Patient tolerated the procedure well and remained hemodynamically stable throughout. No complications were  encountered and no significant blood loss encountered. FINDINGS: Ultrasound survey of the right inguinal region demonstrates patency of the right common femoral vein and the right common femoral artery. Initial images demonstrate significant retained enteric contrast through the colon, limiting the fidelity of the angiogram. Limited mesenteric angiogram of celiac artery demonstrates typical anatomy with patent splenic artery, common hepatic artery, gastroduodenal artery, and small left gastric artery. Dedicated selective and sub selective angiogram of the second and third order branches of the celiac artery demonstrate no tumor blush, extravasation of contrast, pooling of contrast, pseudoaneurysm, or arterial venous shunting. Status post coil embolization of gastroduodenal artery, there is no residual flow. IMPRESSION: Status post ultrasound-guided right common femoral vein central venous catheter. Catheter  ready for use. Status post mesenteric angiogram with empiric embolization of gastroduodenal artery for life-threatening acute hemorrhage observed on endoscopy at the duodenum. Status post deployment of right common femoral artery Exoseal. Signed, Dulcy Fanny. Earleen Newport, DO [facility] Radiology Electronically Signed   By: Corrie Mckusick D.O.   On: 12/18/2015 15:38   Dg Chest Port 1 View  Result Date: 01/02/2016 CLINICAL DATA:  Respiratory failure EXAM: PORTABLE CHEST 1 VIEW COMPARISON:  12/05/2015 FINDINGS: Endotracheal tube is between the clavicles and the carina, measuring approximately 1.2 cm above the carina. Kyphotic positioning for this image foreshortens the tracheal length. Unchanged lateral left base opacity. Right lung is clear. No large effusion. IMPRESSION: Endotracheal tube has been pulled back and now is just above the carina. Electronically Signed   By: Andreas Newport M.D.   On: January 02, 2016 03:44   Dg Chest Port 1 View  Result Date: 02-Jan-2016 CLINICAL DATA:  Encounter for intubation. EXAM: PORTABLE CHEST 1 VIEW COMPARISON:  Earlier this day at 0845 hour FINDINGS: Right mainstem intubation. Recommend retraction of at least 3 cm. The patient is rotated. Lung volumes are low, probable developing left basilar atelectasis. No definite pleural fluid. No pneumothorax. IMPRESSION: 1. Right mainstem intubation. Recommend retraction of at least 3 cm. 2. Rotated exam with low lung volumes. Developing left lung base atelectasis. These results will be called to the ordering clinician or representative by the Radiologist Assistant, and communication documented in the PACS or zVision Dashboard. Electronically Signed   By: Jeb Levering M.D.   On: 01/02/2016 01:09   Dg Chest Port 1 View  Result Date: 12/07/2015 CLINICAL DATA:  65 year old female with a history of vomiting blood. Gastric bypass. EXAM: PORTABLE CHEST 1 VIEW COMPARISON:  07/09/2015 FINDINGS:  Cardiomediastinal silhouette unchanged in size and contour. Surgical changes of the cervical region. No pneumothorax or pleural effusion. Coarsened interstitial markings, similar to the comparison study. No confluent airspace disease. No displaced fracture. IMPRESSION: Chronic lung changes without evidence of superimposed acute cardiopulmonary disease. Signed, Dulcy Fanny. Earleen Newport, DO [facility] Radiology Electronically Signed   By: Corrie Mckusick D.O.   On: 12/28/2015 09:22   [place] Guide Roadmapping  Result Date: 12/28/2015 INDICATION: 65 year old female with admission for acute hematemesis and upper gastrointestinal hemorrhage. Upper endoscopy has demonstrated acute hemorrhage within the duodenum, as a target for angiogram/embolization. EXAM: IR EMBO ART VEN HEMORR LYMPH EXTRAV INC GUIDE ROADMAPPING; IR ULTRASOUND GUIDANCE VASC ACCESS RIGHT; ARTERIOGRAPHY; IR RIGHT FLOURO GUIDE CV LINE; SELECTIVE VISCERAL ARTERIOGRAPHY; ADDITIONAL ARTERIOGRAPHY ULTRASOUND-GUIDED CENTRAL VENOUS CATHETER PLACEMENT. MEDICATIONS: None ANESTHESIA/SEDATION: Moderate (conscious) sedation was employed during this procedure. A total of Versed 1.5 mg and Fentanyl 50 mcg was administered  intravenously. Moderate Sedation Time: 57 minutes. The patient's level of consciousness and vital signs were monitored continuously by radiology nursing throughout the procedure under my direct supervision. CONTRAST:  55 cc Isovue FLUOROSCOPY TIME:  Fluoroscopy Time: 17 minutes 18 seconds (1613 mGy). COMPLICATIONS: None PROCEDURE: Informed consent was obtained from the patient following explanation of the procedure, risks, benefits and alternatives. The patient understands, agrees and consents for the procedure. All questions were addressed. A time out was performed prior to the initiation of the procedure. Maximal barrier sterile technique utilized including caps, mask,  sterile gowns, sterile gloves, large sterile drape, hand hygiene, and Betadine prep. Ultrasound survey of the right inguinal region was stored of the venous anatomy. After generous local anesthesia 0 was injected using 1% lidocaine, a small incision was made in the skin. A single wall needle was observed under ultrasound to enter the right common femoral vein. With venous blood flow returned, a standard 035 wire was advanced under fluoroscopy into the IVC. The needle was removed. Soft tissue dilation was performed. Triple-lumen catheter was then placed over the wire and a wire was removed. The 3 ports were flushed and sterile caps placed. Catheter was sutured in position. Ultrasound survey of the right common femoral artery was performed with images stored and sent to PACs. A single wall needle was used access the right common femoral artery under ultrasound. With excellent arterial blood flow returned, a standard 035 wire was passed through the needle, observed enter the abdominal aorta under fluoroscopy. The needle was removed. A standard 5 French vascular sheath was placed. The dilator was removed and the sheath was flushed. Bentson wire was advanced into the abdominal aorta. A standard 5 French C2 Cobra catheter was advanced over the wire and used to select the celiac artery. Celiac angiogram was performed. Obliquities were required with limited angiogram given the presence of retained enteric contrast within the colon. Micro catheter system with 0 1 for micro wire and O2 1 lumen micro catheter were then advanced through the base catheter into the common hepatic artery. The gastroduodenal artery was selected, with dedicated angiogram performed. Given the patient's upper endoscopy findings, empiric embolization was then performed of the gastroduodenal artery through the micro catheter. Status post embolization repeat angiogram was performed with no persisting flow through the gastroduodenal artery. Angiogram of  the common femoral artery access site was performed. Exoseal was deployed for hemostasis. Patient tolerated the procedure well and remained hemodynamically stable throughout. No complications were encountered and no significant blood loss encountered. FINDINGS: Ultrasound survey of the right inguinal region demonstrates patency of the right common femoral vein and the right common femoral artery. Initial images demonstrate significant retained enteric contrast through the colon, limiting the fidelity of the angiogram. Limited mesenteric angiogram of celiac artery demonstrates typical anatomy with patent splenic artery, common hepatic artery, gastroduodenal artery, and small left gastric artery. Dedicated selective and sub selective angiogram of the second and third order branches of the celiac artery demonstrate no tumor blush, extravasation of contrast, pooling of contrast, pseudoaneurysm, or arterial venous shunting. Status post coil embolization of gastroduodenal artery, there is no residual flow. IMPRESSION: Status post ultrasound-guided right common femoral vein central venous catheter. Catheter ready for use. Status post mesenteric angiogram with empiric embolization of gastroduodenal artery for life-threatening acute hemorrhage observed on endoscopy at the duodenum. Status post deployment of right common femoral artery Exoseal. Signed, Dulcy Fanny. Earleen Newport, DO [facility] Radiology Electronically Signed   By: York Cerise  Earleen Newport D.O.   On: 12/07/2015 15:38   Ct Angio Abd/pel W/ And/or W/o  Result Date: 12-11-2015 CLINICAL DATA:  Prior gastric bypass. Upper GI bleed. Recent gastroduodenal artery embolization. Marginal ulcer and large volume clot observed in gastrojejunostomy at endoscopy. EXAM: CTA ABDOMEN AND PELVIS wITHOUT AND WITH CONTRAST TECHNIQUE: Multidetector CT imaging of the abdomen and pelvis was performed using the standard protocol during bolus  administration of intravenous contrast. Multiplanar reconstructed images and MIPs were obtained and reviewed to evaluate the vascular anatomy. CONTRAST:  100 mL Isovue 370 intravenous COMPARISON:  12/06/2015, 07/05/2015 FINDINGS: VASCULAR Aorta: Normal caliber and patent. Celiac: Normal caliber and patent. Common hepatic artery is diminutive in caliber but patent. Coils in the expected location of the GDA. SMA: Normal caliber and patent. Renals: Normal caliber and patent. IMA: Normal caliber and patent. Inflow: Widely patent common iliac, internal iliac and external iliac arteries per common femoral arteries are widely patent. No arterial hemorrhage is evident on this scan. Review of the MIP images confirms the above findings. NON-VASCULAR Lower chest: No significant abnormality Hepatobiliary: Cholecystectomy P liver and bile ducts are unremarkable. Pancreas: No pancreatic parenchymal lesions are evident. No pancreatic duct dilatation. No acute inflammatory changes. Spleen: Normal Adrenals/Urinary Tract: The adrenals and kidneys are normal in appearance. There is no urinary calculus evident. There is no hydronephrosis or ureteral dilatation. Collecting systems and ureters appear unremarkable. Urinary bladder is collapsed around a Foley catheter. Stomach/Bowel: Gastric bypass with patent gastrojejunostomy. There is prominent distention of the gastric pouch with slightly high attenuation material. This presumably corresponds to the large thrombus described at endoscopy. Active hemorrhage into the stomach is not visible on this study. The excluded stomach does exhibit a slight degree of high attenuation intraluminal content although not as prominent as on 07/05/2015. There also is a small volume of higher attenuation material within the duodenum. This might represent a gastrogastric fistula or retrograde flow up the afferent limb, but it cannot be characterized. Small bowel is unremarkable. Colon contains a generous  volume enteric contrast from the recent upper GI examination. Lymphatic: No adenopathy is evident in the abdomen or pelvis. Reproductive: Uterus and adnexal structures are unremarkable. Musculoskeletal: No significant skeletal lesion. Other: No ascites.  No extraluminal air. IMPRESSION: VASCULAR No evidence of active hemorrhage into the stomach. NON-VASCULAR Marked distention of the gastric pouch, continuing across the patent gastric jejunostomy into the proximal portion of the jejunum which is also mildly distended. This may correspond to the thrombus described endoscopically. Electronically Signed   By: Andreas Newport M.D.   On: 2015/12/11 02:00     STUDIES:    CULTURES:   ANTIBIOTICS:   SIGNIFICANT EVENTS:   LINES/TUBES: R fem CVL 9/10 > L fem art 9/11 >  DISCUSSION: 65 year old female with a past medical history significant for gastric bypass surgery and chronic abdominal pain taking meloxicam for the same presented to the emergency department on 12/21/2015 with an upper GI bleed. Differential diagnosis includes peptic ulcer disease, gastritis related to medications, Mallory-Weiss tear or other. She initially  Underwent endoscopy, which demonstrated possible MW tear, large gastric ulcer in the cardia and possible duodenal ulcer and IR was consulted. She was taken to IR and underwent embolization of the GDA. She had continued bleeding and required intubation for airway protection. She underwent additional endoscopy. Cardia ulcer was injected with lidocaine, however, no obvious oozing. Despite all these interventions she continued to have BRBPR and shock requiring pressors. CCS felt that if bleeding could not be  abated endoscopically then she would need surgery which would likely prove fatal in this setting. Will continue to monitor closely and replace blood products as needed. Support with vent and pressors until surgical plan is developed.    ASSESSMENT /  PLAN:  PULMONARY A: Inability to protect airway in setting of lethargy  P:   Full vent support High MV for acidosis Vent bundle Repeat ABG  CARDIOVASCULAR A:  Hemorrhagic shock Lactic acidosis due to hypotension and bleeding  P:  MAP goal 37mmHg Blood product administration as indication Levophed, vasopressin, now add neo for MAP goal If no surgical option, likely need to consider DNR Gentle IV fluids Trend lactic, troponin Telemetry monitoring  RENAL A:   Chronic kidney disease, uncertain baseline P:   Monitor BMET and UOP Replace electrolytes as needed  GASTROINTESTINAL A:   Upper GI bleed, acute Chronic abdominal pain History of gastric bypass surgery P:   GI following Infusion of pantoprazole Requires advanced endoscopy vs surgery which would be dangerous. Unstable for transfer. General surgery considering options.   HEMATOLOGIC A:   Hemorrhagic anemia P:  CBC every 6 hours Transfuse if hemoglobin less than 8 while actively bleeding  INFECTIOUS A:   No acute issues P:   Monitor for infection  ENDOCRINE A:   History of hypothyroidism   P:   Hold Synthroid today, restart when taking by mouth  NEUROLOGIC A:   History of anxiety and chronic pain Mild confusion P:   Hold home sedating medicines for now Monitor closely in ICU   FAMILY  - Updates: Fianc updated at bedside by me on December 18, 2015  - Inter-disciplinary family meet or Palliative Care meeting due by:  9/17  Georgann Housekeeper, AGACNP-BC [facility] Pager [phone] or [phone]  18-Dec-2015 6:28 AM  Attending Note:  65 year old famle, s/p gastric bypass surgery years ago who presents to the hospital with a GI bleed, hemorrhagic shock, ongoing bleeding, refractory acidosis and acute renal failure.  On exam, she is comfortable and lungs are clear.  GI performed EGD and an ulcer was injected.  General surgery is concerned about GI findings and concerned that  surgically the patient would die on the table given the extent of the surgeries that the patient has had in the past.  I spoke with Dr. Redmond Pulling personally, he is very uncomfortable taking patient to the OR given the extent and details of previous surgery and was wanting another EGD for reinspection.  He will be speaking with Dr. Excell Seltzer regarding plan of care and will inform us of Dr. Excell Seltzer recommendations.  From PCCM standpoint, the patient is not a candidate for transfer given her pressor need and vital signs so unfortunately transfer is not an option.  I spoke with the patient's husband, he states that the patietn has a living will and he would like to leave it up to medical recommendations regarding code status.  After discussion, we agreed that we will continue aggressive measures for now until Dr. Excell Seltzer evaluates the patient at 7 AM, if nothing can be done the husband is agreeable to DNR status and we can discuss further but if there is anything that can be done then continue treatment.  Will have to revisit multiple times throughout the day, this will be an evolving situation.  Pending Dr. Excell Seltzer assessment may consider calling IR but again, not sure what they would coagulate the CT angio was not fully conclusive.  With regards to hyperkalemia, will start bicarb drip,  calcium and glucose and insulin, unable to give kayexalate given GI situation.  PCCM will continue f/u.  The patient is critically ill with multiple organ systems failure and requires high complexity decision making for assessment and support, frequent evaluation and titration of therapies, application of advanced monitoring technologies and extensive interpretation of multiple databases.   Critical Care Time devoted to patient care services described in this note is  60  Minutes. This time reflects time of care of this signee Dr Jennet Maduro. This critical care time does not reflect procedure time, or teaching time or supervisory  time of PA/NP/Med student/Med Resident etc but could involve care discussion time.  Rush Farmer, M.D. [facility] Pulmonary/Critical Care Medicine. Pager: [phone]. After hours pager: [phone].

## 2015-12-29 NOTE — Progress Notes (Addendum)
[facility] Progress Note Patient Name: ADELADE OSTERMEIER DOB: 11/04/1950 MRN: [id number]   Date of Service  2016/01/05  HPI/Events of Note  GI Bleeding - Hgb = 7.6. BP = 108/60.  eICU Interventions  Will transfuse 2 units PRBC when available.      Intervention Category Major Interventions: Hemorrhage - evaluation and management  Mayte Diers Eugene 01/05/2016, 9:07 PM

## 2015-12-29 NOTE — Progress Notes (Signed)
PCCM INTERVAL PROGRESS NOTE  Called to bedside by RN to discuss goals of care at family request. Based on poor prognosis and continued deterioration despite maximum medical therapies they wish to pursue comfort care at this point. Will place orders and DNR in chart.   Georgann Housekeeper, AGACNP-BC [facility] Pulmonology/Critical Care Pager [phone] or [phone]  December 11, 2015 11:11 PM

## 2015-12-29 NOTE — Progress Notes (Signed)
CRITICAL VALUE ALERT  Critical value received:  [place] 6.9  Date of notification:  01-02-16  Time of notification: 1505  Nurse who received alert:  Zigmund Gottron, RN  Montey Hora at bedside with fiance and notified of ABG results. Family wishes to continue plan of care, no new orders received will continue to monitor.

## 2015-12-29 NOTE — Progress Notes (Signed)
Called to bedside by RN to update family member who just arrived.  Pt's fiance updated this am by Dr. Nelda Marseille but legal next of kin (cousin Sharee Pimple) now arrived.  Discussed severity of pt's illness and overall poor prognosis.  Not stable for surgical intervention at this time.  Sharee Pimple agreed that patient would not want aggressive care if she "was not going to make it or have a good quality of life".  We agreed that it was reasonable to continue current medical treatment, but if pt worsened to the point of cardiac arrest, Sharee Pimple feels that she would not want CPR/cardioversion.  Fiance expressed similar feelings this am with Dr. Nelda Marseille.   Will continue all current therapies including multiple pressors, blood transfusions, HCO3 gtt.  Monitor cbc closely.  GI and surgery following closely as well.    Nickolas Madrid, NP 01/05/16  11:44 AM Pager: [phone] or [phone]

## 2015-12-29 NOTE — Progress Notes (Signed)
Patient ID: Lauren Mckenzie, female   DOB: Dec 23, 1950, 65 y.o.   MRN: [id number] 1 Day Post-Op  Subjective: Sedated and intubated and in no apparent distress. Has been responsive per nursing. Had a bloody bowel movement earlier this morning. No recent hematemesis.  Objective: Vital signs in last 24 hours: Temp:  [97.3 F (36.3 C)-98.3 F (36.8 C)] 98 F (36.7 C) (09/11 0345) Pulse Rate:  [11-113] 96 (09/11 0345) Resp:  [6-33] 28 (09/11 0700) BP: (50-160)/(15-118) 95/64 (09/11 0645) SpO2:  [95 %-100 %] 100 % (09/11 0301) FiO2 (%):  [1 %-50 %] 40 % (09/11 0301) Weight:  [83.9 kg (185 lb)-101.3 kg (223 lb 5.2 oz)] 101.3 kg (223 lb 5.2 oz) (09/11 0500) Last BM Date: 12-16-2015  Intake/Output from previous day: 09/10 0701 - 09/11 0700 In: 6433.7 [P.O.:240; I.V.:4055.4; Blood:1803.3] Out: 1210 [Urine:960; Emesis/NG output:250] Intake/Output this shift: No intake/output data recorded.  General appearance: Sedated on vent and not responsive to voice. GI: Long healed midline incision. Nondistended and no apparent tenderness. No rectal bleeding at present.  Lab Results:   Recent Labs  16-Dec-2015 0000 12/16/15 0500  WBC 25.3* 35.6*  HGB 7.2* 9.8*  HCT 23.3* 31.2*  PLT 303 295   BMET  Recent Labs  12/12/2015 2100 2015/12/16 0500  NA 134* 139  K 4.9 6.1*  CL 110 114*  CO2 18* 13*  GLUCOSE 323* 222*  BUN 27* 37*  CREATININE 1.55* 2.21*  CALCIUM 6.3* 6.9*     Studies/Results: Ir Angiogram Visceral Selective  Result Date: 12/07/2015 INDICATION: 65 year old female with admission for acute hematemesis and upper gastrointestinal hemorrhage. Upper endoscopy has demonstrated acute hemorrhage within the duodenum, as a target for angiogram/embolization. EXAM: IR EMBO ART VEN HEMORR LYMPH EXTRAV INC GUIDE ROADMAPPING; IR ULTRASOUND GUIDANCE VASC ACCESS RIGHT; ARTERIOGRAPHY; IR RIGHT FLOURO GUIDE CV LINE; SELECTIVE VISCERAL ARTERIOGRAPHY; ADDITIONAL ARTERIOGRAPHY ULTRASOUND-GUIDED CENTRAL  VENOUS CATHETER PLACEMENT. MEDICATIONS: None ANESTHESIA/SEDATION: Moderate (conscious) sedation was employed during this procedure. A total of Versed 1.5 mg and Fentanyl 50 mcg was administered intravenously. Moderate Sedation Time: 57 minutes. The patient's level of consciousness and vital signs were monitored continuously by radiology nursing throughout the procedure under my direct supervision. CONTRAST:  55 cc Isovue FLUOROSCOPY TIME:  Fluoroscopy Time: 17 minutes 18 seconds (1613 mGy). COMPLICATIONS: None PROCEDURE: Informed consent was obtained from the patient following explanation of the procedure, risks, benefits and alternatives. The patient understands, agrees and consents for the procedure. All questions were addressed. A time out was performed prior to the initiation of the procedure. Maximal barrier sterile technique utilized including caps, mask, sterile gowns, sterile gloves, large sterile drape, hand hygiene, and Betadine prep. Ultrasound survey of the right inguinal region was stored of the venous anatomy. After generous local anesthesia 0 was injected using 1% lidocaine, a small incision was made in the skin. A single wall needle was observed under ultrasound to enter the right common femoral vein. With venous blood flow returned, a standard 035 wire was advanced under fluoroscopy into the IVC. The needle was removed. Soft tissue dilation was performed. Triple-lumen catheter was then placed over the wire and a wire was removed. The 3 ports were flushed and sterile caps placed. Catheter was sutured in position. Ultrasound survey of the right common femoral artery was performed with images stored and sent to PACs. A single wall needle was used access the right common femoral artery under ultrasound. With excellent arterial blood flow returned, a standard 035 wire was passed through the needle,  observed enter the abdominal aorta under fluoroscopy. The needle was removed. A standard 5 French vascular  sheath was placed. The dilator was removed and the sheath was flushed. Bentson wire was advanced into the abdominal aorta. A standard 5 French C2 Cobra catheter was advanced over the wire and used to select the celiac artery. Celiac angiogram was performed. Obliquities were required with limited angiogram given the presence of retained enteric contrast within the colon. Micro catheter system with 0 1 for micro wire and O2 1 lumen micro catheter were then advanced through the base catheter into the common hepatic artery. The gastroduodenal artery was selected, with dedicated angiogram performed. Given the patient's upper endoscopy findings, empiric embolization was then performed of the gastroduodenal artery through the micro catheter. Status post embolization repeat angiogram was performed with no persisting flow through the gastroduodenal artery. Angiogram of the common femoral artery access site was performed. Exoseal was deployed for hemostasis. Patient tolerated the procedure well and remained hemodynamically stable throughout. No complications were encountered and no significant blood loss encountered. FINDINGS: Ultrasound survey of the right inguinal region demonstrates patency of the right common femoral vein and the right common femoral artery. Initial images demonstrate significant retained enteric contrast through the colon, limiting the fidelity of the angiogram. Limited mesenteric angiogram of celiac artery demonstrates typical anatomy with patent splenic artery, common hepatic artery, gastroduodenal artery, and small left gastric artery. Dedicated selective and sub selective angiogram of the second and third order branches of the celiac artery demonstrate no tumor blush, extravasation of contrast, pooling of contrast, pseudoaneurysm, or arterial venous shunting. Status post coil embolization of gastroduodenal artery, there is no residual flow. IMPRESSION: Status post ultrasound-guided right common  femoral vein central venous catheter. Catheter ready for use. Status post mesenteric angiogram with empiric embolization of gastroduodenal artery for life-threatening acute hemorrhage observed on endoscopy at the duodenum. Status post deployment of right common femoral artery Exoseal. Signed, Dulcy Fanny. Earleen Newport, DO [facility] Radiology Electronically Signed   By: Corrie Mckusick D.O.   On: 11/29/2015 15:38   Ir Angiogram Selective Each Additional Vessel  Result Date: 12/28/2015 INDICATION: 65 year old female with admission for acute hematemesis and upper gastrointestinal hemorrhage. Upper endoscopy has demonstrated acute hemorrhage within the duodenum, as a target for angiogram/embolization. EXAM: IR EMBO ART VEN HEMORR LYMPH EXTRAV INC GUIDE ROADMAPPING; IR ULTRASOUND GUIDANCE VASC ACCESS RIGHT; ARTERIOGRAPHY; IR RIGHT FLOURO GUIDE CV LINE; SELECTIVE VISCERAL ARTERIOGRAPHY; ADDITIONAL ARTERIOGRAPHY ULTRASOUND-GUIDED CENTRAL VENOUS CATHETER PLACEMENT. MEDICATIONS: None ANESTHESIA/SEDATION: Moderate (conscious) sedation was employed during this procedure. A total of Versed 1.5 mg and Fentanyl 50 mcg was administered intravenously. Moderate Sedation Time: 57 minutes. The patient's level of consciousness and vital signs were monitored continuously by radiology nursing throughout the procedure under my direct supervision. CONTRAST:  55 cc Isovue FLUOROSCOPY TIME:  Fluoroscopy Time: 17 minutes 18 seconds (1613 mGy). COMPLICATIONS: None PROCEDURE: Informed consent was obtained from the patient following explanation of the procedure, risks, benefits and alternatives. The patient understands, agrees and consents for the procedure. All questions were addressed. A time out was performed prior to the initiation of the procedure. Maximal barrier sterile technique utilized including caps, mask, sterile gowns, sterile gloves, large sterile drape, hand hygiene, and Betadine prep.  Ultrasound survey of the right inguinal region was stored of the venous anatomy. After generous local anesthesia 0 was injected using 1% lidocaine, a small incision was made in the skin. A single wall needle was observed under ultrasound to  enter the right common femoral vein. With venous blood flow returned, a standard 035 wire was advanced under fluoroscopy into the IVC. The needle was removed. Soft tissue dilation was performed. Triple-lumen catheter was then placed over the wire and a wire was removed. The 3 ports were flushed and sterile caps placed. Catheter was sutured in position. Ultrasound survey of the right common femoral artery was performed with images stored and sent to PACs. A single wall needle was used access the right common femoral artery under ultrasound. With excellent arterial blood flow returned, a standard 035 wire was passed through the needle, observed enter the abdominal aorta under fluoroscopy. The needle was removed. A standard 5 French vascular sheath was placed. The dilator was removed and the sheath was flushed. Bentson wire was advanced into the abdominal aorta. A standard 5 French C2 Cobra catheter was advanced over the wire and used to select the celiac artery. Celiac angiogram was performed. Obliquities were required with limited angiogram given the presence of retained enteric contrast within the colon. Micro catheter system with 0 1 for micro wire and O2 1 lumen micro catheter were then advanced through the base catheter into the common hepatic artery. The gastroduodenal artery was selected, with dedicated angiogram performed. Given the patient's upper endoscopy findings, empiric embolization was then performed of the gastroduodenal artery through the micro catheter. Status post embolization repeat angiogram was performed with no persisting flow through the gastroduodenal artery. Angiogram of the common femoral artery access site was performed. Exoseal was deployed for  hemostasis. Patient tolerated the procedure well and remained hemodynamically stable throughout. No complications were encountered and no significant blood loss encountered. FINDINGS: Ultrasound survey of the right inguinal region demonstrates patency of the right common femoral vein and the right common femoral artery. Initial images demonstrate significant retained enteric contrast through the colon, limiting the fidelity of the angiogram. Limited mesenteric angiogram of celiac artery demonstrates typical anatomy with patent splenic artery, common hepatic artery, gastroduodenal artery, and small left gastric artery. Dedicated selective and sub selective angiogram of the second and third order branches of the celiac artery demonstrate no tumor blush, extravasation of contrast, pooling of contrast, pseudoaneurysm, or arterial venous shunting. Status post coil embolization of gastroduodenal artery, there is no residual flow. IMPRESSION: Status post ultrasound-guided right common femoral vein central venous catheter. Catheter ready for use. Status post mesenteric angiogram with empiric embolization of gastroduodenal artery for life-threatening acute hemorrhage observed on endoscopy at the duodenum. Status post deployment of right common femoral artery Exoseal. Signed, Dulcy Fanny. Earleen Newport, DO [facility] Radiology Electronically Signed   By: Corrie Mckusick D.O.   On: 12/20/2015 15:38   Ir Angiogram Follow Up Study  Result Date: 12/17/2015 INDICATION: 65 year old female with admission for acute hematemesis and upper gastrointestinal hemorrhage. Upper endoscopy has demonstrated acute hemorrhage within the duodenum, as a target for angiogram/embolization. EXAM: IR EMBO ART VEN HEMORR LYMPH EXTRAV INC GUIDE ROADMAPPING; IR ULTRASOUND GUIDANCE VASC ACCESS RIGHT; ARTERIOGRAPHY; IR RIGHT FLOURO GUIDE CV LINE; SELECTIVE VISCERAL ARTERIOGRAPHY; ADDITIONAL ARTERIOGRAPHY  ULTRASOUND-GUIDED CENTRAL VENOUS CATHETER PLACEMENT. MEDICATIONS: None ANESTHESIA/SEDATION: Moderate (conscious) sedation was employed during this procedure. A total of Versed 1.5 mg and Fentanyl 50 mcg was administered intravenously. Moderate Sedation Time: 57 minutes. The patient's level of consciousness and vital signs were monitored continuously by radiology nursing throughout the procedure under my direct supervision. CONTRAST:  55 cc Isovue FLUOROSCOPY TIME:  Fluoroscopy Time: 17 minutes 18 seconds (1613 mGy). COMPLICATIONS: None  PROCEDURE: Informed consent was obtained from the patient following explanation of the procedure, risks, benefits and alternatives. The patient understands, agrees and consents for the procedure. All questions were addressed. A time out was performed prior to the initiation of the procedure. Maximal barrier sterile technique utilized including caps, mask, sterile gowns, sterile gloves, large sterile drape, hand hygiene, and Betadine prep. Ultrasound survey of the right inguinal region was stored of the venous anatomy. After generous local anesthesia 0 was injected using 1% lidocaine, a small incision was made in the skin. A single wall needle was observed under ultrasound to enter the right common femoral vein. With venous blood flow returned, a standard 035 wire was advanced under fluoroscopy into the IVC. The needle was removed. Soft tissue dilation was performed. Triple-lumen catheter was then placed over the wire and a wire was removed. The 3 ports were flushed and sterile caps placed. Catheter was sutured in position. Ultrasound survey of the right common femoral artery was performed with images stored and sent to PACs. A single wall needle was used access the right common femoral artery under ultrasound. With excellent arterial blood flow returned, a standard 035 wire was passed through the needle, observed enter the abdominal aorta under fluoroscopy. The needle was removed. A  standard 5 French vascular sheath was placed. The dilator was removed and the sheath was flushed. Bentson wire was advanced into the abdominal aorta. A standard 5 French C2 Cobra catheter was advanced over the wire and used to select the celiac artery. Celiac angiogram was performed. Obliquities were required with limited angiogram given the presence of retained enteric contrast within the colon. Micro catheter system with 0 1 for micro wire and O2 1 lumen micro catheter were then advanced through the base catheter into the common hepatic artery. The gastroduodenal artery was selected, with dedicated angiogram performed. Given the patient's upper endoscopy findings, empiric embolization was then performed of the gastroduodenal artery through the micro catheter. Status post embolization repeat angiogram was performed with no persisting flow through the gastroduodenal artery. Angiogram of the common femoral artery access site was performed. Exoseal was deployed for hemostasis. Patient tolerated the procedure well and remained hemodynamically stable throughout. No complications were encountered and no significant blood loss encountered. FINDINGS: Ultrasound survey of the right inguinal region demonstrates patency of the right common femoral vein and the right common femoral artery. Initial images demonstrate significant retained enteric contrast through the colon, limiting the fidelity of the angiogram. Limited mesenteric angiogram of celiac artery demonstrates typical anatomy with patent splenic artery, common hepatic artery, gastroduodenal artery, and small left gastric artery. Dedicated selective and sub selective angiogram of the second and third order branches of the celiac artery demonstrate no tumor blush, extravasation of contrast, pooling of contrast, pseudoaneurysm, or arterial venous shunting. Status post coil embolization of gastroduodenal artery, there is no residual flow. IMPRESSION: Status post  ultrasound-guided right common femoral vein central venous catheter. Catheter ready for use. Status post mesenteric angiogram with empiric embolization of gastroduodenal artery for life-threatening acute hemorrhage observed on endoscopy at the duodenum. Status post deployment of right common femoral artery Exoseal. Signed, Dulcy Fanny. Earleen Newport, DO [facility] Radiology Electronically Signed   By: Corrie Mckusick D.O.   On: 12/17/2015 15:38   Ir Fluoro Guide Cv Line Right  Result Date: 12/20/2015 INDICATION: 65 year old female with admission for acute hematemesis and upper gastrointestinal hemorrhage. Upper endoscopy has demonstrated acute hemorrhage within the duodenum, as a target for  angiogram/embolization. EXAM: IR EMBO ART VEN HEMORR LYMPH EXTRAV INC GUIDE ROADMAPPING; IR ULTRASOUND GUIDANCE VASC ACCESS RIGHT; ARTERIOGRAPHY; IR RIGHT FLOURO GUIDE CV LINE; SELECTIVE VISCERAL ARTERIOGRAPHY; ADDITIONAL ARTERIOGRAPHY ULTRASOUND-GUIDED CENTRAL VENOUS CATHETER PLACEMENT. MEDICATIONS: None ANESTHESIA/SEDATION: Moderate (conscious) sedation was employed during this procedure. A total of Versed 1.5 mg and Fentanyl 50 mcg was administered intravenously. Moderate Sedation Time: 57 minutes. The patient's level of consciousness and vital signs were monitored continuously by radiology nursing throughout the procedure under my direct supervision. CONTRAST:  55 cc Isovue FLUOROSCOPY TIME:  Fluoroscopy Time: 17 minutes 18 seconds (1613 mGy). COMPLICATIONS: None PROCEDURE: Informed consent was obtained from the patient following explanation of the procedure, risks, benefits and alternatives. The patient understands, agrees and consents for the procedure. All questions were addressed. A time out was performed prior to the initiation of the procedure. Maximal barrier sterile technique utilized including caps, mask, sterile gowns, sterile gloves, large sterile drape, hand hygiene, and  Betadine prep. Ultrasound survey of the right inguinal region was stored of the venous anatomy. After generous local anesthesia 0 was injected using 1% lidocaine, a small incision was made in the skin. A single wall needle was observed under ultrasound to enter the right common femoral vein. With venous blood flow returned, a standard 035 wire was advanced under fluoroscopy into the IVC. The needle was removed. Soft tissue dilation was performed. Triple-lumen catheter was then placed over the wire and a wire was removed. The 3 ports were flushed and sterile caps placed. Catheter was sutured in position. Ultrasound survey of the right common femoral artery was performed with images stored and sent to PACs. A single wall needle was used access the right common femoral artery under ultrasound. With excellent arterial blood flow returned, a standard 035 wire was passed through the needle, observed enter the abdominal aorta under fluoroscopy. The needle was removed. A standard 5 French vascular sheath was placed. The dilator was removed and the sheath was flushed. Bentson wire was advanced into the abdominal aorta. A standard 5 French C2 Cobra catheter was advanced over the wire and used to select the celiac artery. Celiac angiogram was performed. Obliquities were required with limited angiogram given the presence of retained enteric contrast within the colon. Micro catheter system with 0 1 for micro wire and O2 1 lumen micro catheter were then advanced through the base catheter into the common hepatic artery. The gastroduodenal artery was selected, with dedicated angiogram performed. Given the patient's upper endoscopy findings, empiric embolization was then performed of the gastroduodenal artery through the micro catheter. Status post embolization repeat angiogram was performed with no persisting flow through the gastroduodenal artery. Angiogram of the common femoral artery access site was performed. Exoseal was  deployed for hemostasis. Patient tolerated the procedure well and remained hemodynamically stable throughout. No complications were encountered and no significant blood loss encountered. FINDINGS: Ultrasound survey of the right inguinal region demonstrates patency of the right common femoral vein and the right common femoral artery. Initial images demonstrate significant retained enteric contrast through the colon, limiting the fidelity of the angiogram. Limited mesenteric angiogram of celiac artery demonstrates typical anatomy with patent splenic artery, common hepatic artery, gastroduodenal artery, and small left gastric artery. Dedicated selective and sub selective angiogram of the second and third order branches of the celiac artery demonstrate no tumor blush, extravasation of contrast, pooling of contrast, pseudoaneurysm, or arterial venous shunting. Status post coil embolization of gastroduodenal artery, there is no residual flow. IMPRESSION: Status post ultrasound-guided  right common femoral vein central venous catheter. Catheter ready for use. Status post mesenteric angiogram with empiric embolization of gastroduodenal artery for life-threatening acute hemorrhage observed on endoscopy at the duodenum. Status post deployment of right common femoral artery Exoseal. Signed, Dulcy Fanny. Earleen Newport, DO [facility] Radiology Electronically Signed   By: Corrie Mckusick D.O.   On: 12/02/2015 15:38   Ir US Guide Vasc Access Right  Result Date: 12/21/2015 INDICATION: 65 year old female with admission for acute hematemesis and upper gastrointestinal hemorrhage. Upper endoscopy has demonstrated acute hemorrhage within the duodenum, as a target for angiogram/embolization. EXAM: IR EMBO ART VEN HEMORR LYMPH EXTRAV INC GUIDE ROADMAPPING; IR ULTRASOUND GUIDANCE VASC ACCESS RIGHT; ARTERIOGRAPHY; IR RIGHT FLOURO GUIDE CV LINE; SELECTIVE VISCERAL ARTERIOGRAPHY; ADDITIONAL  ARTERIOGRAPHY ULTRASOUND-GUIDED CENTRAL VENOUS CATHETER PLACEMENT. MEDICATIONS: None ANESTHESIA/SEDATION: Moderate (conscious) sedation was employed during this procedure. A total of Versed 1.5 mg and Fentanyl 50 mcg was administered intravenously. Moderate Sedation Time: 57 minutes. The patient's level of consciousness and vital signs were monitored continuously by radiology nursing throughout the procedure under my direct supervision. CONTRAST:  55 cc Isovue FLUOROSCOPY TIME:  Fluoroscopy Time: 17 minutes 18 seconds (1613 mGy). COMPLICATIONS: None PROCEDURE: Informed consent was obtained from the patient following explanation of the procedure, risks, benefits and alternatives. The patient understands, agrees and consents for the procedure. All questions were addressed. A time out was performed prior to the initiation of the procedure. Maximal barrier sterile technique utilized including caps, mask, sterile gowns, sterile gloves, large sterile drape, hand hygiene, and Betadine prep. Ultrasound survey of the right inguinal region was stored of the venous anatomy. After generous local anesthesia 0 was injected using 1% lidocaine, a small incision was made in the skin. A single wall needle was observed under ultrasound to enter the right common femoral vein. With venous blood flow returned, a standard 035 wire was advanced under fluoroscopy into the IVC. The needle was removed. Soft tissue dilation was performed. Triple-lumen catheter was then placed over the wire and a wire was removed. The 3 ports were flushed and sterile caps placed. Catheter was sutured in position. Ultrasound survey of the right common femoral artery was performed with images stored and sent to PACs. A single wall needle was used access the right common femoral artery under ultrasound. With excellent arterial blood flow returned, a standard 035 wire was passed through the needle, observed enter the abdominal aorta under fluoroscopy. The needle  was removed. A standard 5 French vascular sheath was placed. The dilator was removed and the sheath was flushed. Bentson wire was advanced into the abdominal aorta. A standard 5 French C2 Cobra catheter was advanced over the wire and used to select the celiac artery. Celiac angiogram was performed. Obliquities were required with limited angiogram given the presence of retained enteric contrast within the colon. Micro catheter system with 0 1 for micro wire and O2 1 lumen micro catheter were then advanced through the base catheter into the common hepatic artery. The gastroduodenal artery was selected, with dedicated angiogram performed. Given the patient's upper endoscopy findings, empiric embolization was then performed of the gastroduodenal artery through the micro catheter. Status post embolization repeat angiogram was performed with no persisting flow through the gastroduodenal artery. Angiogram of the common femoral artery access site was performed. Exoseal was deployed for hemostasis. Patient tolerated the procedure well and remained hemodynamically stable throughout. No complications were encountered and no significant blood loss encountered. FINDINGS: Ultrasound survey of the right  inguinal region demonstrates patency of the right common femoral vein and the right common femoral artery. Initial images demonstrate significant retained enteric contrast through the colon, limiting the fidelity of the angiogram. Limited mesenteric angiogram of celiac artery demonstrates typical anatomy with patent splenic artery, common hepatic artery, gastroduodenal artery, and small left gastric artery. Dedicated selective and sub selective angiogram of the second and third order branches of the celiac artery demonstrate no tumor blush, extravasation of contrast, pooling of contrast, pseudoaneurysm, or arterial venous shunting. Status post coil embolization of gastroduodenal artery, there is no residual flow. IMPRESSION: Status  post ultrasound-guided right common femoral vein central venous catheter. Catheter ready for use. Status post mesenteric angiogram with empiric embolization of gastroduodenal artery for life-threatening acute hemorrhage observed on endoscopy at the duodenum. Status post deployment of right common femoral artery Exoseal. Signed, Dulcy Fanny. Earleen Newport, DO [facility] Radiology Electronically Signed   By: Corrie Mckusick D.O.   On: 12/26/2015 15:38   Ir US Guide Vasc Access Right  Result Date: 12/05/2015 INDICATION: 66 year old female with admission for acute hematemesis and upper gastrointestinal hemorrhage. Upper endoscopy has demonstrated acute hemorrhage within the duodenum, as a target for angiogram/embolization. EXAM: IR EMBO ART VEN HEMORR LYMPH EXTRAV INC GUIDE ROADMAPPING; IR ULTRASOUND GUIDANCE VASC ACCESS RIGHT; ARTERIOGRAPHY; IR RIGHT FLOURO GUIDE CV LINE; SELECTIVE VISCERAL ARTERIOGRAPHY; ADDITIONAL ARTERIOGRAPHY ULTRASOUND-GUIDED CENTRAL VENOUS CATHETER PLACEMENT. MEDICATIONS: None ANESTHESIA/SEDATION: Moderate (conscious) sedation was employed during this procedure. A total of Versed 1.5 mg and Fentanyl 50 mcg was administered intravenously. Moderate Sedation Time: 57 minutes. The patient's level of consciousness and vital signs were monitored continuously by radiology nursing throughout the procedure under my direct supervision. CONTRAST:  55 cc Isovue FLUOROSCOPY TIME:  Fluoroscopy Time: 17 minutes 18 seconds (1613 mGy). COMPLICATIONS: None PROCEDURE: Informed consent was obtained from the patient following explanation of the procedure, risks, benefits and alternatives. The patient understands, agrees and consents for the procedure. All questions were addressed. A time out was performed prior to the initiation of the procedure. Maximal barrier sterile technique utilized including caps, mask, sterile gowns, sterile gloves, large sterile drape, hand hygiene,  and Betadine prep. Ultrasound survey of the right inguinal region was stored of the venous anatomy. After generous local anesthesia 0 was injected using 1% lidocaine, a small incision was made in the skin. A single wall needle was observed under ultrasound to enter the right common femoral vein. With venous blood flow returned, a standard 035 wire was advanced under fluoroscopy into the IVC. The needle was removed. Soft tissue dilation was performed. Triple-lumen catheter was then placed over the wire and a wire was removed. The 3 ports were flushed and sterile caps placed. Catheter was sutured in position. Ultrasound survey of the right common femoral artery was performed with images stored and sent to PACs. A single wall needle was used access the right common femoral artery under ultrasound. With excellent arterial blood flow returned, a standard 035 wire was passed through the needle, observed enter the abdominal aorta under fluoroscopy. The needle was removed. A standard 5 French vascular sheath was placed. The dilator was removed and the sheath was flushed. Bentson wire was advanced into the abdominal aorta. A standard 5 French C2 Cobra catheter was advanced over the wire and used to select the celiac artery. Celiac angiogram was performed. Obliquities were required with limited angiogram given the presence of retained enteric contrast within the colon. Micro catheter system with 0 1 for micro  wire and O2 1 lumen micro catheter were then advanced through the base catheter into the common hepatic artery. The gastroduodenal artery was selected, with dedicated angiogram performed. Given the patient's upper endoscopy findings, empiric embolization was then performed of the gastroduodenal artery through the micro catheter. Status post embolization repeat angiogram was performed with no persisting flow through the gastroduodenal artery. Angiogram of the common femoral artery access site was performed. Exoseal was  deployed for hemostasis. Patient tolerated the procedure well and remained hemodynamically stable throughout. No complications were encountered and no significant blood loss encountered. FINDINGS: Ultrasound survey of the right inguinal region demonstrates patency of the right common femoral vein and the right common femoral artery. Initial images demonstrate significant retained enteric contrast through the colon, limiting the fidelity of the angiogram. Limited mesenteric angiogram of celiac artery demonstrates typical anatomy with patent splenic artery, common hepatic artery, gastroduodenal artery, and small left gastric artery. Dedicated selective and sub selective angiogram of the second and third order branches of the celiac artery demonstrate no tumor blush, extravasation of contrast, pooling of contrast, pseudoaneurysm, or arterial venous shunting. Status post coil embolization of gastroduodenal artery, there is no residual flow. IMPRESSION: Status post ultrasound-guided right common femoral vein central venous catheter. Catheter ready for use. Status post mesenteric angiogram with empiric embolization of gastroduodenal artery for life-threatening acute hemorrhage observed on endoscopy at the duodenum. Status post deployment of right common femoral artery Exoseal. Signed, Dulcy Fanny. Earleen Newport, DO [facility] Radiology Electronically Signed   By: Corrie Mckusick D.O.   On: 12/23/2015 15:38   Dg Chest Port 1 View  Result Date: 12/20/2015 CLINICAL DATA:  Respiratory failure EXAM: PORTABLE CHEST 1 VIEW COMPARISON:   FINDINGS: Endotracheal tube is between the clavicles and the carina, measuring approximately 1.2 cm above the carina. Kyphotic positioning for this image foreshortens the tracheal length. Unchanged lateral left base opacity. Right lung is clear. No large effusion. IMPRESSION: Endotracheal tube has been pulled back and now is just above the  carina. Electronically Signed   By: Andreas Newport M.D.   On: December 20, 2015 03:44   Dg Chest Port 1 View  Result Date: Dec 20, 2015 CLINICAL DATA:  Encounter for intubation. EXAM: PORTABLE CHEST 1 VIEW COMPARISON:  Earlier this day at 0845 hour FINDINGS: Right mainstem intubation. Recommend retraction of at least 3 cm. The patient is rotated. Lung volumes are low, probable developing left basilar atelectasis. No definite pleural fluid. No pneumothorax. IMPRESSION: 1. Right mainstem intubation. Recommend retraction of at least 3 cm. 2. Rotated exam with low lung volumes. Developing left lung base atelectasis. These results will be called to the ordering clinician or representative by the Radiologist Assistant, and communication documented in the PACS or zVision Dashboard. Electronically Signed   By: Jeb Levering M.D.   On: 12/20/15 01:09   Dg Chest Port 1 View  Result Date: 12/21/2015 CLINICAL DATA:  65 year old female with a history of vomiting blood. Gastric bypass. EXAM: PORTABLE CHEST 1 VIEW COMPARISON:  07/09/2015 FINDINGS: Cardiomediastinal silhouette unchanged in size and contour. Surgical changes of the cervical region. No pneumothorax or pleural effusion. Coarsened interstitial markings, similar to the comparison study. No confluent airspace disease. No displaced fracture. IMPRESSION: Chronic lung changes without evidence of superimposed acute cardiopulmonary disease. Signed, Dulcy Fanny. Earleen Newport, DO [facility] Radiology Electronically Signed   By: Corrie Mckusick D.O.   On: 12/11/2015 09:22   Ir Embo Art  Lawson Fiscal Hemorr Lymph Netta Corrigan  Inc Guide Roadmapping  Result Date: 12/28/2015 INDICATION: 65 year old female with admission for acute hematemesis and upper gastrointestinal hemorrhage. Upper endoscopy has demonstrated acute hemorrhage within the duodenum, as a target for angiogram/embolization. EXAM: IR EMBO ART VEN HEMORR LYMPH EXTRAV INC GUIDE  ROADMAPPING; IR ULTRASOUND GUIDANCE VASC ACCESS RIGHT; ARTERIOGRAPHY; IR RIGHT FLOURO GUIDE CV LINE; SELECTIVE VISCERAL ARTERIOGRAPHY; ADDITIONAL ARTERIOGRAPHY ULTRASOUND-GUIDED CENTRAL VENOUS CATHETER PLACEMENT. MEDICATIONS: None ANESTHESIA/SEDATION: Moderate (conscious) sedation was employed during this procedure. A total of Versed 1.5 mg and Fentanyl 50 mcg was administered intravenously. Moderate Sedation Time: 57 minutes. The patient's level of consciousness and vital signs were monitored continuously by radiology nursing throughout the procedure under my direct supervision. CONTRAST:  55 cc Isovue FLUOROSCOPY TIME:  Fluoroscopy Time: 17 minutes 18 seconds (1613 mGy). COMPLICATIONS: None PROCEDURE: Informed consent was obtained from the patient following explanation of the procedure, risks, benefits and alternatives. The patient understands, agrees and consents for the procedure. All questions were addressed. A time out was performed prior to the initiation of the procedure. Maximal barrier sterile technique utilized including caps, mask, sterile gowns, sterile gloves, large sterile drape, hand hygiene, and Betadine prep. Ultrasound survey of the right inguinal region was stored of the venous anatomy. After generous local anesthesia 0 was injected using 1% lidocaine, a small incision was made in the skin. A single wall needle was observed under ultrasound to enter the right common femoral vein. With venous blood flow returned, a standard 035 wire was advanced under fluoroscopy into the IVC. The needle was removed. Soft tissue dilation was performed. Triple-lumen catheter was then placed over the wire and a wire was removed. The 3 ports were flushed and sterile caps placed. Catheter was sutured in position. Ultrasound survey of the right common femoral artery was performed with images stored and sent to PACs. A single wall needle was used access the right common femoral artery under ultrasound. With excellent  arterial blood flow returned, a standard 035 wire was passed through the needle, observed enter the abdominal aorta under fluoroscopy. The needle was removed. A standard 5 French vascular sheath was placed. The dilator was removed and the sheath was flushed. Bentson wire was advanced into the abdominal aorta. A standard 5 French C2 Cobra catheter was advanced over the wire and used to select the celiac artery. Celiac angiogram was performed. Obliquities were required with limited angiogram given the presence of retained enteric contrast within the colon. Micro catheter system with 0 1 for micro wire and O2 1 lumen micro catheter were then advanced through the base catheter into the common hepatic artery. The gastroduodenal artery was selected, with dedicated angiogram performed. Given the patient's upper endoscopy findings, empiric embolization was then performed of the gastroduodenal artery through the micro catheter. Status post embolization repeat angiogram was performed with no persisting flow through the gastroduodenal artery. Angiogram of the common femoral artery access site was performed. Exoseal was deployed for hemostasis. Patient tolerated the procedure well and remained hemodynamically stable throughout. No complications were encountered and no significant blood loss encountered. FINDINGS: Ultrasound survey of the right inguinal region demonstrates patency of the right common femoral vein and the right common femoral artery. Initial images demonstrate significant retained enteric contrast through the colon, limiting the fidelity of the angiogram. Limited mesenteric angiogram of celiac artery demonstrates typical anatomy with patent splenic artery, common hepatic artery, gastroduodenal artery, and small left gastric artery. Dedicated selective and sub selective angiogram of the second and third order branches of the celiac  artery demonstrate no tumor blush, extravasation of contrast, pooling of contrast,  pseudoaneurysm, or arterial venous shunting. Status post coil embolization of gastroduodenal artery, there is no residual flow. IMPRESSION: Status post ultrasound-guided right common femoral vein central venous catheter. Catheter ready for use. Status post mesenteric angiogram with empiric embolization of gastroduodenal artery for life-threatening acute hemorrhage observed on endoscopy at the duodenum. Status post deployment of right common femoral artery Exoseal. Signed, Dulcy Fanny. Earleen Newport, DO [facility] Radiology Electronically Signed   By: Corrie Mckusick D.O.   On: 12/19/2015 15:38   Ct Angio Abd/pel W/ And/or W/o  Result Date: 01-07-2016 CLINICAL DATA:  Prior gastric bypass. Upper GI bleed. Recent gastroduodenal artery embolization. Marginal ulcer and large volume clot observed in gastrojejunostomy at endoscopy. EXAM: CTA ABDOMEN AND PELVIS wITHOUT AND WITH CONTRAST TECHNIQUE: Multidetector CT imaging of the abdomen and pelvis was performed using the standard protocol during bolus administration of intravenous contrast. Multiplanar reconstructed images and MIPs were obtained and reviewed to evaluate the vascular anatomy. CONTRAST:  100 mL Isovue 370 intravenous COMPARISON:  12/06/2015, 07/05/2015 FINDINGS: VASCULAR Aorta: Normal caliber and patent. Celiac: Normal caliber and patent. Common hepatic artery is diminutive in caliber but patent. Coils in the expected location of the GDA. SMA: Normal caliber and patent. Renals: Normal caliber and patent. IMA: Normal caliber and patent. Inflow: Widely patent common iliac, internal iliac and external iliac arteries per common femoral arteries are widely patent. No arterial hemorrhage is evident on this scan. Review of the MIP images confirms the above findings. NON-VASCULAR Lower chest: No significant abnormality Hepatobiliary: Cholecystectomy P liver and bile ducts are unremarkable. Pancreas: No pancreatic parenchymal  lesions are evident. No pancreatic duct dilatation. No acute inflammatory changes. Spleen: Normal Adrenals/Urinary Tract: The adrenals and kidneys are normal in appearance. There is no urinary calculus evident. There is no hydronephrosis or ureteral dilatation. Collecting systems and ureters appear unremarkable. Urinary bladder is collapsed around a Foley catheter. Stomach/Bowel: Gastric bypass with patent gastrojejunostomy. There is prominent distention of the gastric pouch with slightly high attenuation material. This presumably corresponds to the large thrombus described at endoscopy. Active hemorrhage into the stomach is not visible on this study. The excluded stomach does exhibit a slight degree of high attenuation intraluminal content although not as prominent as on 07/05/2015. There also is a small volume of higher attenuation material within the duodenum. This might represent a gastrogastric fistula or retrograde flow up the afferent limb, but it cannot be characterized. Small bowel is unremarkable. Colon contains a generous volume enteric contrast from the recent upper GI examination. Lymphatic: No adenopathy is evident in the abdomen or pelvis. Reproductive: Uterus and adnexal structures are unremarkable. Musculoskeletal: No significant skeletal lesion. Other: No ascites.  No extraluminal air. IMPRESSION: VASCULAR No evidence of active hemorrhage into the stomach. NON-VASCULAR Marked distention of the gastric pouch, continuing across the patent gastric jejunostomy into the proximal portion of the jejunum which is also mildly distended. This may correspond to the thrombus described endoscopically. Electronically Signed   By: Andreas Newport M.D.   On: 07-Jan-2016 02:00    Anti-infectives: Anti-infectives    None      Assessment/Plan: s/p Procedure(s): ESOPHAGOGASTRODUODENOSCOPY (EGD) Remote history of open gastric bypass. History of multiple ventral hernia repairs with mesh. Previous endoscopy  earlier this year showing small marginal ulcer. Now with acute GI bleed and endoscopy showing large clot at the gastric jejunostomy. There was no active bleeding seen either at endoscopy or  on angiogram yesterday. However, talking with the radiologist this morning rereading the CTA yesterday there may be a small area of extravasation from the gastric pouch. Status post embolization of gastroduodenal artery.  She currently is on 3 pressors and max dose levophed. Her hemoglobin did come up moderately with 2 unit transfusion this morning. I think her prognosis is very poor with or without surgery. I have discussed the situation in detail with her significant other. I have discussed the case with Dr. Carol Ada, Dr. Nelda Marseille and with interventional radiology. In her current condition I think her chance of surviving a long difficult operation is extremely slim. Although her prognosis is poor I think her chances are at least as good with continued medical management and if we can get some stabilization consider repeat endoscopy and endoscopic therapy.   LOS: 1 day    Floree Zuniga T 12-23-2015

## 2015-12-29 NOTE — Progress Notes (Signed)
Persistent shock. Art line placed. Bicarb given suspect acidosis. Confirmed on ABG [id number] metabolic. Vent rate increased from 16 to 24.  Pressures look a little better on art line. Hyperglycemic now as well. Will order sliding scale and transition levophed to quad strength to cut down on dextrose.   Georgann Housekeeper, AGACNP-BC [facility] Pulmonology/Critical Care Pager [phone] or [phone]  12-11-15 1:55 AM

## 2015-12-29 NOTE — Progress Notes (Signed)
PCCM Interval Progress Note  Asked to see pt at bedside for refractory shock (SBP 70's) despite 3 pressors on max doses (levophed, neo-synephrine, vasopressin).  ABG back > 6.99 / 23.7 / 175.  Already on HCO3 infusion, rate increased earlier today. CBC back > Hgb 7.7 (was 10.1 just 2 hours earlier).  No signs of bleeding per RN.  Will order additional 2u PRBC now and repeat H/H evening.  Long discussion with pt's fiance and very close friend regarding extremely poor prognosis.  I explained that we have basically done all that we can at this point and that unfortunately, Lauren Mckenzie continues to deteriorate. Fiance and friend do not appear to grasp the severity of [name]'s condition (ie. they asked that we address her sleep problems before she returns home).  I reiterated that her prognosis is extremely poor and that her chances of survival are slim to none.  Fiance and friend then became slightly emotional and asked that we continue full supportive care as we are currently doing and that if her time comes to pass on, then so be it.  I updated nurses regarding the above discussion and informed fiance and friend that we will continue to monitor things closely and update them with any changes.  Additional CC time: 30 minutes.   Montey Hora, [place] Pulmonary & Critical Care Medicine Pager: [phone]  or [phone] 01-04-2016, 3:55 PM

## 2015-12-29 NOTE — Progress Notes (Signed)
Patient ID: Lauren Mckenzie, female   DOB: 28-Mar-1951, 65 y.o.   MRN: [id number]   Pt remains in refractory shock with maximum pressors, no significant change from this AM  BP (!) 80/16   Pulse 80   Temp 98 F (36.7 C) (Axillary)   Resp (!) 23   Ht 5\' 5"  (1.651 m)   Wt 101.3 kg (223 lb 5.2 oz)   SpO2 100%   BMI 37.16 kg/m   No rectal bleeding or hemetemesis but HBG back down to 7.7 from 10.1 earlier  Discussed with sig other and cousin at bedside.  Prognosis grave but I still think essentially no chance of surviving a long difficult operation in her current unstable situation.  All their questions answered.  Edward Jolly MD, FACS  2016-01-01, 5:32 PM

## 2015-12-29 NOTE — Procedures (Signed)
Arterial Catheter Insertion Procedure Note Lauren Mckenzie [id number] January 18, 1951  Procedure: Insertion of Arterial Catheter  Indications: Blood pressure monitoring  Procedure Details Consent: Unable to obtain consent because of emergent medical necessity. Time Out: Verified patient identification, verified procedure, site/side was marked, verified correct patient position, special equipment/implants available, medications/allergies/relevent history reviewed, required imaging and test results available.  Performed  Maximum sterile technique was used including antiseptics, cap, gloves, gown, hand hygiene, mask and sheet. Skin prep: Chlorhexidine; local anesthetic administered 20 gauge catheter was inserted into left femoral artery using the Seldinger technique.  Evaluation Blood flow good; BP tracing good. Complications: No apparent complications.   Georgann Housekeeper, AGACNP-BC [place] Pulmonology/Critical Care Pager [phone] or [phone]  12/16/15 1:49 AM     Rush Farmer, M.D. [facility] Pulmonary/Critical Care Medicine. Pager: [phone]. After hours pager: [phone].

## 2015-12-29 NOTE — Progress Notes (Signed)
I evaluated the the patient earlier and Nursing reported that she was just started on a third pressor and she was acidotic.  I reviewed the events overnight and I was appraised of the situation by Dr. Collene Mares last evening.  I spoke with her fiance, Dr. Collene Mares, and Dr. Excell Seltzer.  The situation is critical.  Dr. Excell Seltzer states that a surgical intervention would result in death.  From the GI standpoint, there is a high likelihood of the same outcome, at this time point.  I fear that if the large gastrojejunal clot was removed/peeled off, massive and uncontrollable bleeding would result.  The positive aspect at this time is that her HGB rose appropriately with blood transfusions.  If she stabilizes further with medical therapy I am willing to repeat the EGD and reassessing the situation.  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  I performed this EGD for complaints of the same type of pain.  At the gastrojejunal anastamosis there was some friability and minor healing/superficial erosions.  I performed an EGD 01/2011 with almost the exact same findings, however, at that time she did not complain about pain.  The reason for the evaluation was for iron deficiency anemia.

## 2015-12-29 DEATH — deceased

## 2016-01-22 ENCOUNTER — Ambulatory Visit: Payer: Self-pay | Admitting: Internal Medicine

## 2016-06-10 ENCOUNTER — Encounter: Payer: Self-pay | Admitting: Physician Assistant

## 2016-10-21 ENCOUNTER — Encounter: Payer: Self-pay | Admitting: Physician Assistant

## 2016-10-26 IMAGING — XA IR EMBO ART  VEN HEMORR LYMPH EXTRAV  INC GUIDE ROADMAPPING
1 series · 12 of 24 positions shown · IV contrast (IODINE)
Comparison: none

INDICATION: 65-year-old female with admission for acute hematemesis and upper
gastrointestinal hemorrhage.

[Series 300: ir embo art  ven hemorr lymph extrav  in · 12 of 135 slices shown]
[im 6/135]
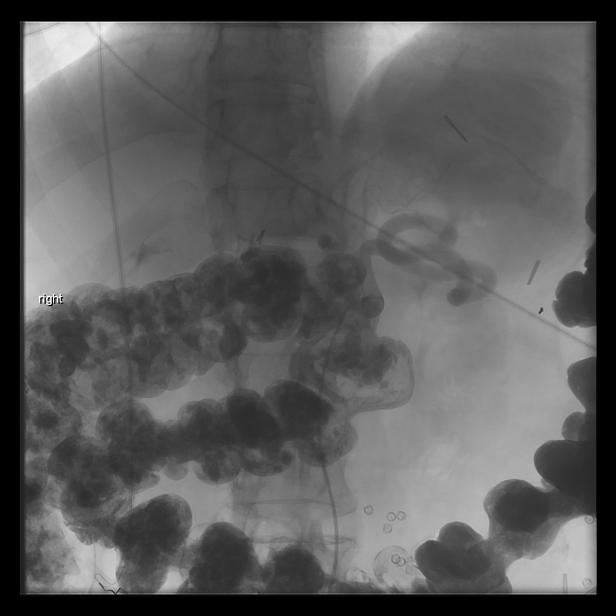
[im 18/135]
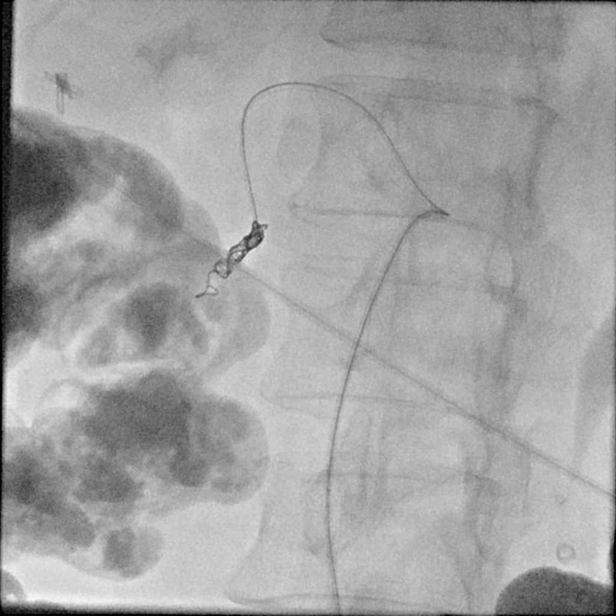
[im 30/135]
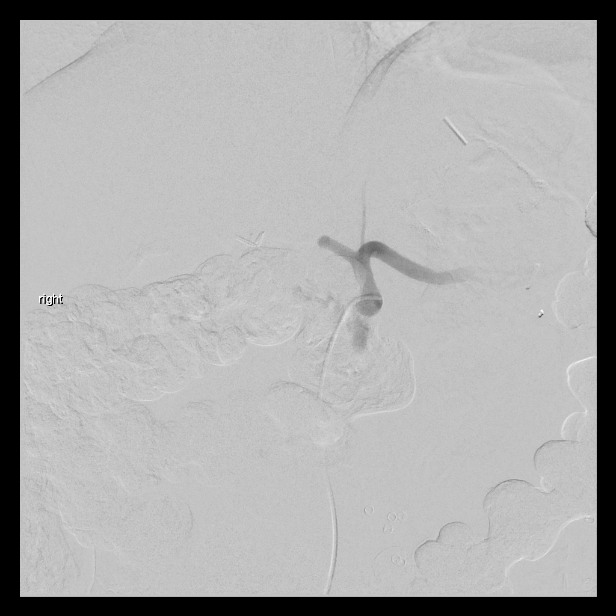
[im 41/135]
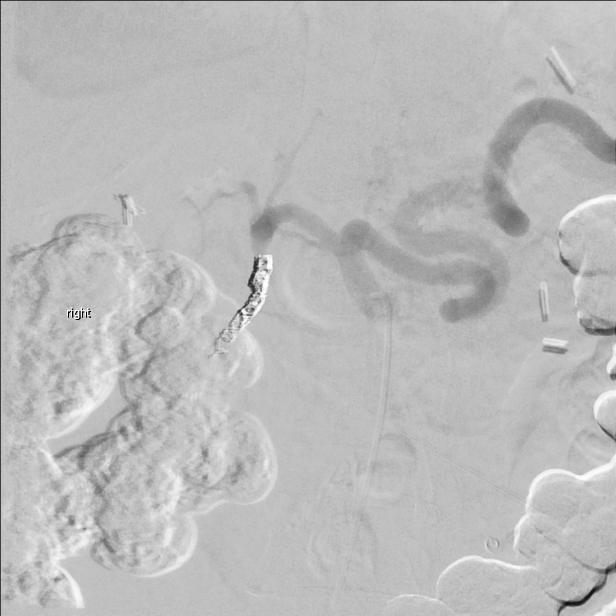
[im 53/135]
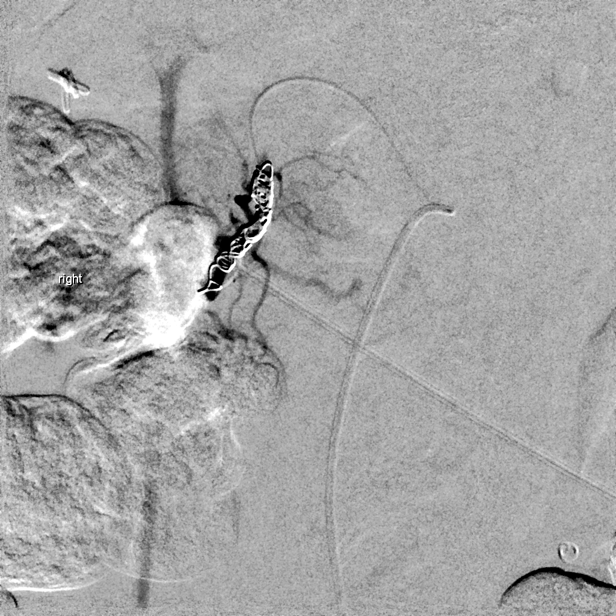
[im 65/135]
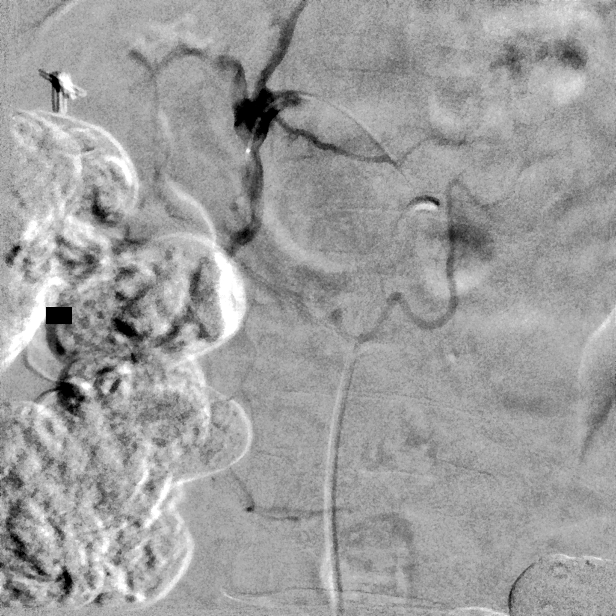
[im 76/135]
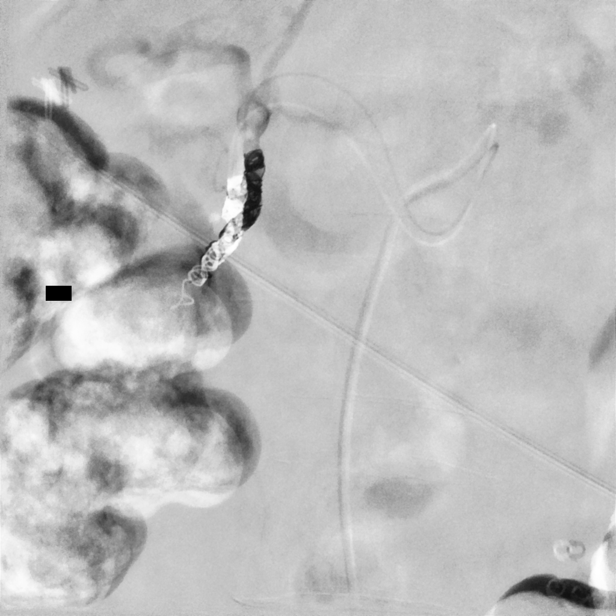
[im 88/135]
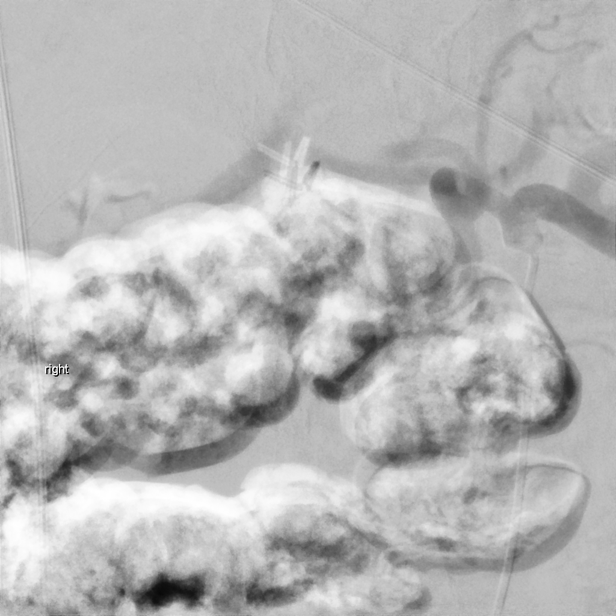
[im 100/135]
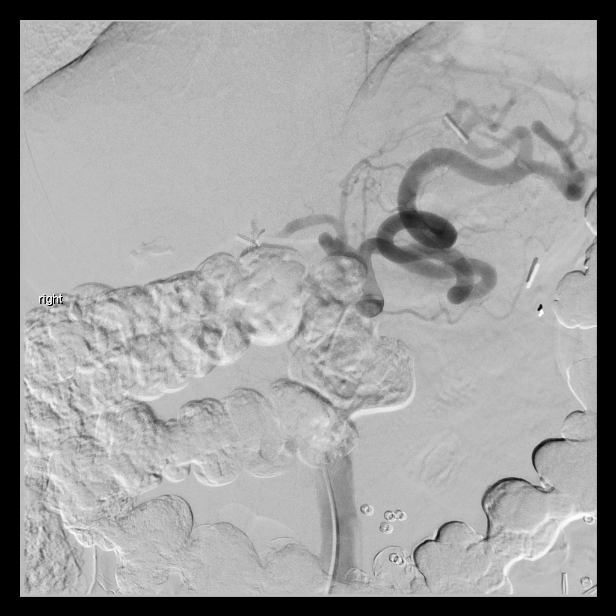
[im 111/135]
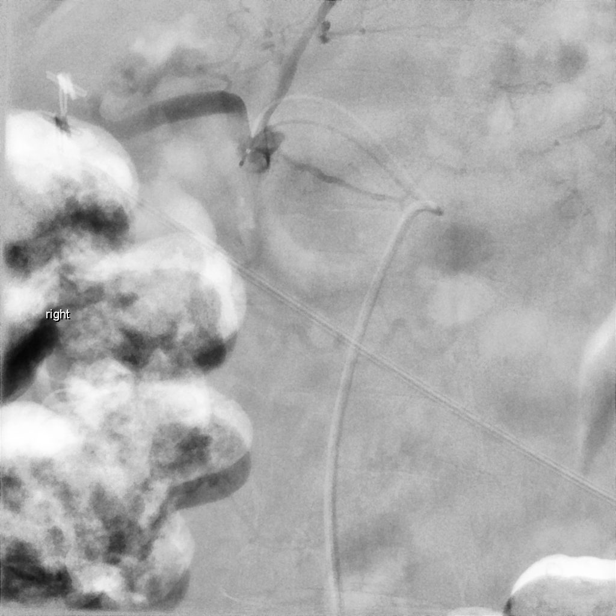
[im 123/135]
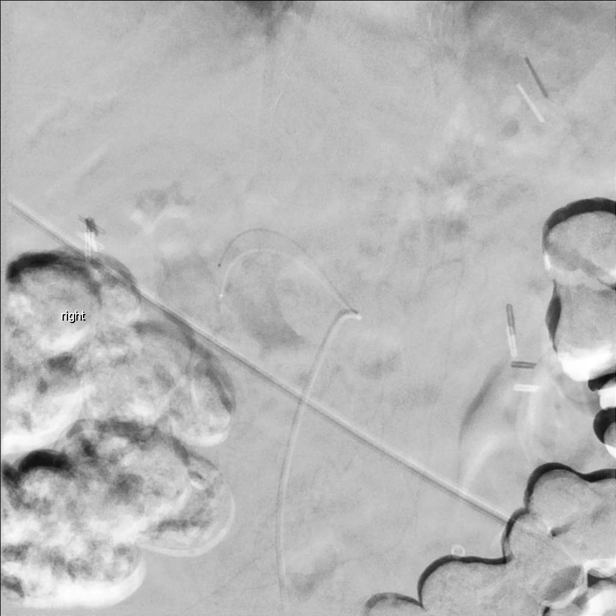
[im 135/135]
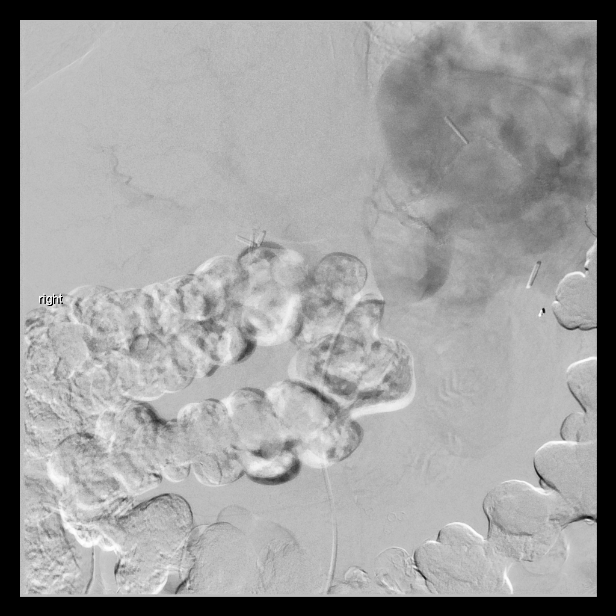

[12 of 24 positions shown; findings below may reference images not displayed]

Upper endoscopy has demonstrated acute hemorrhage within the
duodenum, as a target for angiogram/embolization.

EXAM:
IR ADRIAN ALFREDO CHOLICO HEMORR LYMPH EXTRAV INC GUIDE ROADMAPPING; IR
ULTRASOUND GUIDANCE VASC ACCESS RIGHT; ARTERIOGRAPHY; IR RIGHT
FLOURO GUIDE CV LINE; SELECTIVE VISCERAL ARTERIOGRAPHY; ADDITIONAL
ARTERIOGRAPHY

ULTRASOUND-GUIDED CENTRAL VENOUS CATHETER PLACEMENT.

MEDICATIONS:
None

ANESTHESIA/SEDATION:
Moderate (conscious) sedation was employed during this procedure. A
total of Versed 1.5 mg and Fentanyl 50 mcg was administered
intravenously.

Moderate Sedation Time: 57 minutes. The patient's level of
consciousness and vital signs were monitored continuously by
radiology nursing throughout the procedure under my direct
supervision.

CONTRAST:  55 cc Isovue

FLUOROSCOPY TIME:  Fluoroscopy Time: 17 minutes 18 seconds (6867
mGy).

COMPLICATIONS:
None



Ultrasound survey of the right inguinal region was stored of the
venous anatomy.

After generous local anesthesia 0 was injected using 1% lidocaine, a
small incision was made in the skin. A single wall needle was
observed under ultrasound to enter the right common femoral vein.
With venous blood flow returned, a standard 035 wire was advanced
under fluoroscopy into the IVC. The needle was removed. Soft tissue
dilation was performed. Triple-lumen catheter was then placed over
the wire and a wire was removed. The 3 ports were flushed and
sterile caps placed. Catheter was sutured in position.

Ultrasound survey of the right common femoral artery was performed
with images stored and sent to PACs.

A single wall needle was used access the right common femoral artery
under ultrasound. With excellent arterial blood flow returned, a
standard 035 wire was passed through the needle, observed enter the
abdominal aorta under fluoroscopy. The needle was removed. A
standard 5 French vascular sheath was placed. The dilator was
removed and the sheath was flushed.

Bentson wire was advanced into the abdominal aorta. A standard 5
French C2 Cobra catheter was advanced over the wire and used to
select the celiac artery.

Celiac angiogram was performed. Obliquities were required with
limited angiogram given the presence of retained enteric contrast
within the colon.

Micro catheter system with 0 1 for micro wire and O2 1 lumen micro
catheter were then advanced through the base catheter into the
common hepatic artery. The gastroduodenal artery was selected, with
dedicated angiogram performed.

Given the patient's upper endoscopy findings, empiric embolization
was then performed of the gastroduodenal artery through the micro
catheter.

Status post embolization repeat angiogram was performed with no
persisting flow through the gastroduodenal artery.

Angiogram of the common femoral artery access site was performed.

Exoseal was deployed for hemostasis.

Patient tolerated the procedure well and remained hemodynamically
stable throughout.

No complications were encountered and no significant blood loss
encountered.
FINDINGS: Ultrasound survey of the right inguinal region demonstrates patency
of the right common femoral vein and the right common femoral
artery.

Initial images demonstrate significant retained enteric contrast
through the colon, limiting the fidelity of the angiogram.

Limited mesenteric angiogram of celiac artery demonstrates typical
anatomy with patent splenic artery, common hepatic artery,
gastroduodenal artery, and small left gastric artery.

Dedicated selective and sub selective angiogram of the second and
third order branches of the celiac artery demonstrate no tumor
blush, extravasation of contrast, pooling of contrast,
pseudoaneurysm, or arterial venous shunting.

Status post coil embolization of gastroduodenal artery, there is no
residual flow.
IMPRESSION: Status post ultrasound-guided right common femoral vein central
venous catheter. Catheter ready for use.

Status post mesenteric angiogram with empiric embolization of
gastroduodenal artery for life-threatening acute hemorrhage observed
on endoscopy at the duodenum.

Status post deployment of right common femoral artery Exoseal.

## 2016-10-27 IMAGING — CR DG CHEST 1V PORT
1 series · 1 of 1 positions shown · non-contrast
Comparison: 12/08/2015

CLINICAL DATA: Respiratory failure

EXAM:
PORTABLE CHEST 1 VIEW

[AP]
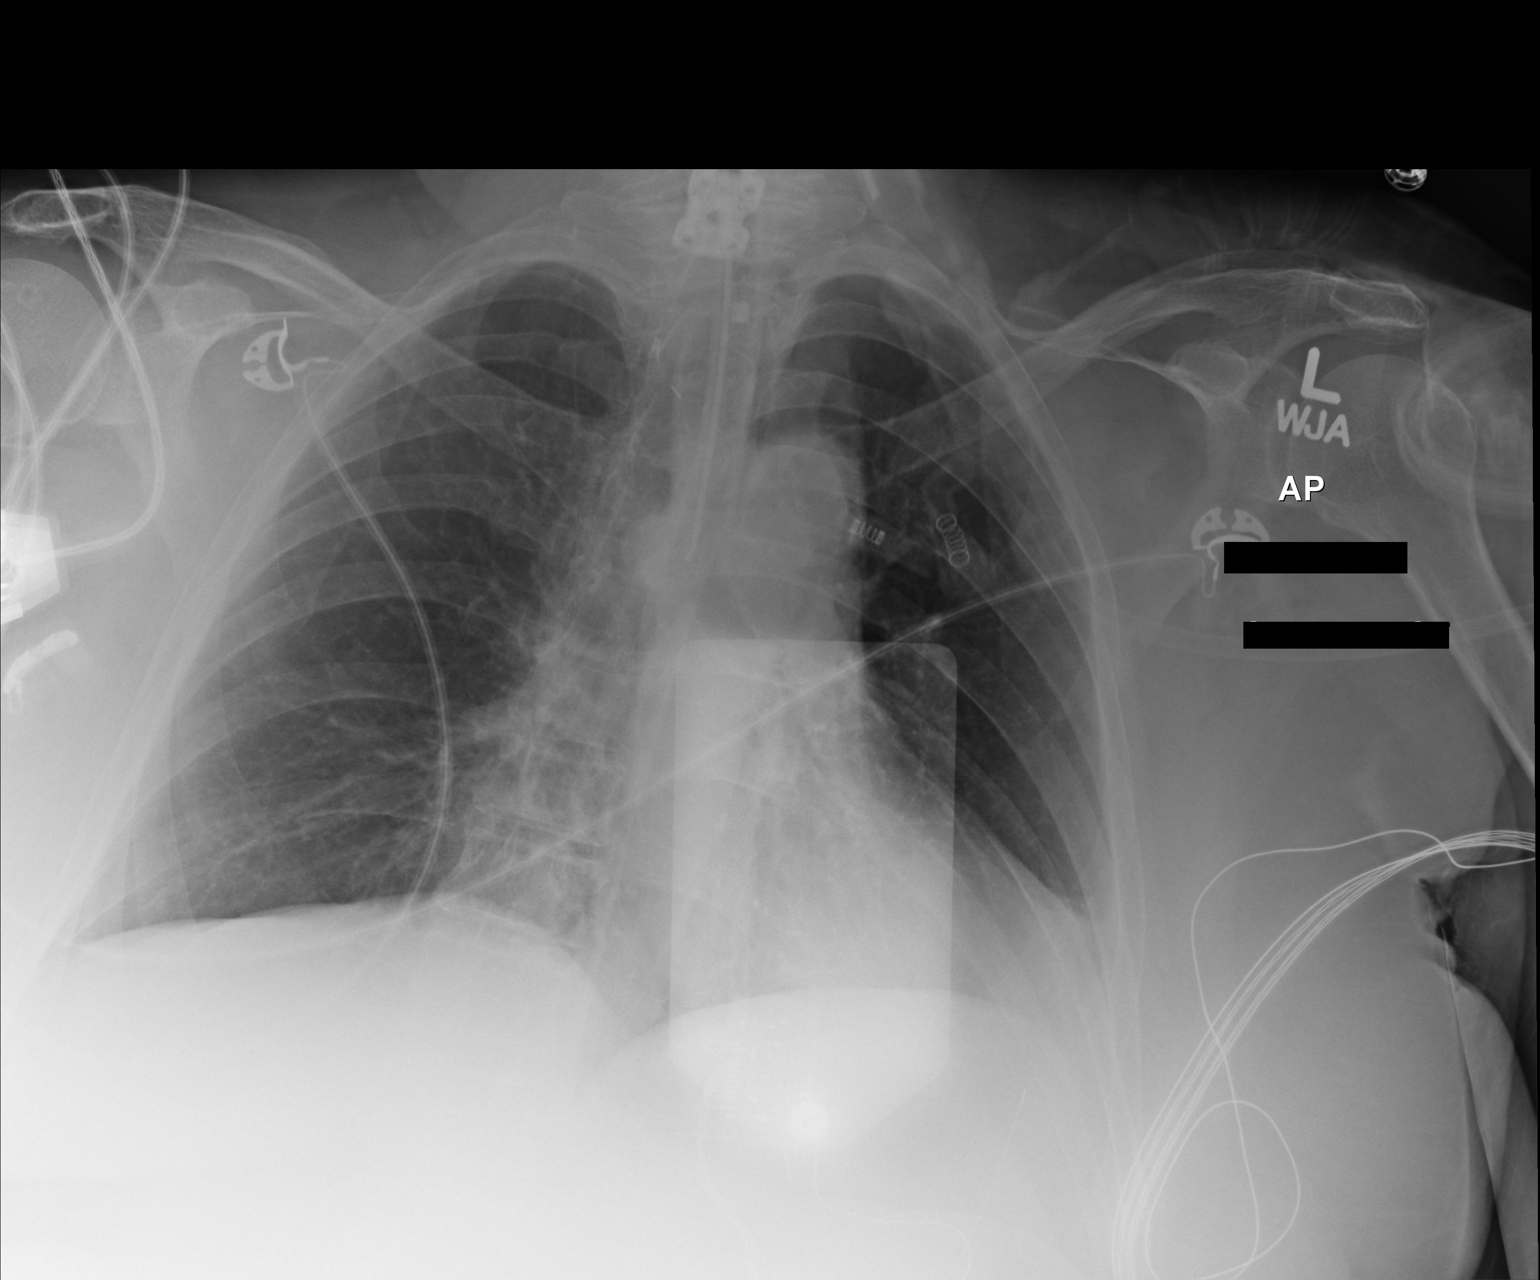

[1 of 1 positions shown; findings below may reference images not displayed]

FINDINGS: Endotracheal tube is between the clavicles and the carina, measuring
approximately 1.2 cm above the carina. Kyphotic positioning for this
image foreshortens the tracheal length.

Unchanged lateral left base opacity. Right lung is clear. No large
effusion.
IMPRESSION: Endotracheal tube has been pulled back and now is just above the
carina.
